# Patient Record
Sex: Female | Born: 1945 | Race: White | Hispanic: No | Marital: Single | Smoking: Former smoker
Health system: Southern US, Community
[De-identification: ages and names within clinical notes are randomized; demographics above are authoritative.]

## PROBLEM LIST (undated history)

## (undated) DIAGNOSIS — Z87440 Personal history of urinary (tract) infections: Secondary | ICD-10-CM

## (undated) DIAGNOSIS — J45909 Unspecified asthma, uncomplicated: Secondary | ICD-10-CM

## (undated) DIAGNOSIS — H409 Unspecified glaucoma: Secondary | ICD-10-CM

## (undated) DIAGNOSIS — M810 Age-related osteoporosis without current pathological fracture: Secondary | ICD-10-CM

## (undated) DIAGNOSIS — M1990 Unspecified osteoarthritis, unspecified site: Secondary | ICD-10-CM

## (undated) DIAGNOSIS — F419 Anxiety disorder, unspecified: Secondary | ICD-10-CM

## (undated) DIAGNOSIS — IMO0002 Reserved for concepts with insufficient information to code with codable children: Secondary | ICD-10-CM

## (undated) DIAGNOSIS — R51 Headache: Secondary | ICD-10-CM

## (undated) DIAGNOSIS — T7840XA Allergy, unspecified, initial encounter: Secondary | ICD-10-CM

## (undated) DIAGNOSIS — C801 Malignant (primary) neoplasm, unspecified: Secondary | ICD-10-CM

## (undated) DIAGNOSIS — E785 Hyperlipidemia, unspecified: Secondary | ICD-10-CM

## (undated) DIAGNOSIS — J301 Allergic rhinitis due to pollen: Secondary | ICD-10-CM

## (undated) DIAGNOSIS — K219 Gastro-esophageal reflux disease without esophagitis: Secondary | ICD-10-CM

## (undated) DIAGNOSIS — B019 Varicella without complication: Secondary | ICD-10-CM

## (undated) HISTORY — DX: Personal history of urinary (tract) infections: Z87.440

## (undated) HISTORY — DX: Unspecified glaucoma: H40.9

## (undated) HISTORY — DX: Allergy, unspecified, initial encounter: T78.40XA

## (undated) HISTORY — PX: MOUTH SURGERY: SHX715

## (undated) HISTORY — DX: Varicella without complication: B01.9

## (undated) HISTORY — DX: Reserved for concepts with insufficient information to code with codable children: IMO0002

## (undated) HISTORY — DX: Allergic rhinitis due to pollen: J30.1

## (undated) HISTORY — DX: Age-related osteoporosis without current pathological fracture: M81.0

## (undated) HISTORY — PX: BREAST SURGERY: SHX581

## (undated) HISTORY — DX: Unspecified osteoarthritis, unspecified site: M19.90

## (undated) HISTORY — DX: Hyperlipidemia, unspecified: E78.5

## (undated) HISTORY — DX: Gastro-esophageal reflux disease without esophagitis: K21.9

## (undated) HISTORY — DX: Malignant (primary) neoplasm, unspecified: C80.1

---
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
## 1972-06-07 HISTORY — PX: BREAST EXCISIONAL BIOPSY: SUR124

## 1994-06-07 HISTORY — PX: BREAST EXCISIONAL BIOPSY: SUR124

## 1997-06-07 HISTORY — PX: BREAST LUMPECTOMY: SHX2

## 1997-11-20 ENCOUNTER — Other Ambulatory Visit: Admission: RE | Admit: 1997-11-20 | Discharge: 1997-11-20 | Payer: Self-pay | Admitting: General Surgery

## 1997-11-27 ENCOUNTER — Ambulatory Visit (HOSPITAL_COMMUNITY): Admission: RE | Admit: 1997-11-27 | Discharge: 1997-11-28 | Payer: Self-pay | Admitting: General Surgery

## 1997-11-29 ENCOUNTER — Encounter: Admission: RE | Admit: 1997-11-29 | Discharge: 1998-02-27 | Payer: Self-pay | Admitting: Radiation Oncology

## 1998-01-09 ENCOUNTER — Ambulatory Visit (HOSPITAL_COMMUNITY): Admission: RE | Admit: 1998-01-09 | Discharge: 1998-01-09 | Payer: Self-pay | Admitting: Hematology and Oncology

## 1998-02-04 ENCOUNTER — Emergency Department (HOSPITAL_COMMUNITY): Admission: EM | Admit: 1998-02-04 | Discharge: 1998-02-04 | Payer: Self-pay | Admitting: Emergency Medicine

## 1998-04-14 ENCOUNTER — Encounter: Admission: RE | Admit: 1998-04-14 | Discharge: 1998-07-13 | Payer: Self-pay | Admitting: Radiation Oncology

## 1999-03-06 ENCOUNTER — Other Ambulatory Visit: Admission: RE | Admit: 1999-03-06 | Discharge: 1999-03-06 | Payer: Self-pay | Admitting: Internal Medicine

## 1999-07-13 ENCOUNTER — Encounter: Admission: RE | Admit: 1999-07-13 | Discharge: 1999-07-13 | Payer: Self-pay | Admitting: Hematology and Oncology

## 1999-07-13 ENCOUNTER — Encounter: Payer: Self-pay | Admitting: Hematology and Oncology

## 2000-01-26 ENCOUNTER — Encounter: Payer: Self-pay | Admitting: Hematology and Oncology

## 2000-01-26 ENCOUNTER — Encounter: Admission: RE | Admit: 2000-01-26 | Discharge: 2000-01-26 | Payer: Self-pay | Admitting: Hematology and Oncology

## 2000-07-15 ENCOUNTER — Encounter: Payer: Self-pay | Admitting: Hematology and Oncology

## 2000-07-15 ENCOUNTER — Encounter: Admission: RE | Admit: 2000-07-15 | Discharge: 2000-07-15 | Payer: Self-pay | Admitting: Hematology and Oncology

## 2001-01-30 ENCOUNTER — Encounter: Admission: RE | Admit: 2001-01-30 | Discharge: 2001-01-30 | Payer: Self-pay | Admitting: Hematology and Oncology

## 2001-01-30 ENCOUNTER — Encounter: Payer: Self-pay | Admitting: Hematology and Oncology

## 2001-12-11 ENCOUNTER — Other Ambulatory Visit: Admission: RE | Admit: 2001-12-11 | Discharge: 2001-12-11 | Payer: Self-pay | Admitting: Internal Medicine

## 2002-01-31 ENCOUNTER — Encounter: Payer: Self-pay | Admitting: Hematology and Oncology

## 2002-01-31 ENCOUNTER — Encounter: Admission: RE | Admit: 2002-01-31 | Discharge: 2002-01-31 | Payer: Self-pay | Admitting: Hematology and Oncology

## 2003-02-05 ENCOUNTER — Encounter (HOSPITAL_COMMUNITY): Payer: Self-pay | Admitting: Oncology

## 2003-02-05 ENCOUNTER — Encounter: Admission: RE | Admit: 2003-02-05 | Discharge: 2003-02-05 | Payer: Self-pay | Admitting: Oncology

## 2003-10-03 ENCOUNTER — Ambulatory Visit (HOSPITAL_COMMUNITY): Admission: RE | Admit: 2003-10-03 | Discharge: 2003-10-03 | Payer: Self-pay | Admitting: Oncology

## 2004-02-06 ENCOUNTER — Encounter: Admission: RE | Admit: 2004-02-06 | Discharge: 2004-02-06 | Payer: Self-pay | Admitting: Oncology

## 2004-07-16 ENCOUNTER — Ambulatory Visit: Payer: Self-pay | Admitting: Gastroenterology

## 2004-07-28 ENCOUNTER — Ambulatory Visit: Payer: Self-pay | Admitting: Gastroenterology

## 2004-07-28 DIAGNOSIS — K5730 Diverticulosis of large intestine without perforation or abscess without bleeding: Secondary | ICD-10-CM | POA: Insufficient documentation

## 2004-07-28 HISTORY — DX: Diverticulosis of large intestine without perforation or abscess without bleeding: K57.30

## 2004-08-21 ENCOUNTER — Ambulatory Visit: Payer: Self-pay | Admitting: Gastroenterology

## 2004-08-24 ENCOUNTER — Ambulatory Visit: Payer: Self-pay | Admitting: Gastroenterology

## 2004-08-24 DIAGNOSIS — K222 Esophageal obstruction: Secondary | ICD-10-CM

## 2004-08-24 HISTORY — DX: Esophageal obstruction: K22.2

## 2004-08-31 ENCOUNTER — Ambulatory Visit: Payer: Self-pay | Admitting: Oncology

## 2005-02-11 ENCOUNTER — Encounter: Admission: RE | Admit: 2005-02-11 | Discharge: 2005-02-11 | Payer: Self-pay | Admitting: General Surgery

## 2005-09-08 ENCOUNTER — Ambulatory Visit: Payer: Self-pay | Admitting: Oncology

## 2005-09-09 LAB — CBC WITH DIFFERENTIAL/PLATELET
Basophils Absolute: 0 10*3/uL (ref 0.0–0.1)
Eosinophils Absolute: 0.1 10*3/uL (ref 0.0–0.5)
HCT: 40.5 % (ref 34.8–46.6)
HGB: 13.8 g/dL (ref 11.6–15.9)
MCHC: 34.2 g/dL (ref 32.0–36.0)
MCV: 88.3 fL (ref 81.0–101.0)
NEUT#: 3.8 10*3/uL (ref 1.5–6.5)
Platelets: 314 10*3/uL (ref 145–400)
RBC: 4.58 10*6/uL (ref 3.70–5.32)
WBC: 5.9 10*3/uL (ref 3.9–10.0)

## 2005-09-09 LAB — COMPREHENSIVE METABOLIC PANEL
Chloride: 106 mEq/L (ref 96–112)
Creatinine, Ser: 0.7 mg/dL (ref 0.4–1.2)
Total Protein: 6.5 g/dL (ref 6.0–8.3)

## 2006-02-14 ENCOUNTER — Encounter: Admission: RE | Admit: 2006-02-14 | Discharge: 2006-02-14 | Payer: Self-pay | Admitting: General Surgery

## 2007-01-19 DIAGNOSIS — Z853 Personal history of malignant neoplasm of breast: Secondary | ICD-10-CM

## 2007-01-19 DIAGNOSIS — R51 Headache: Secondary | ICD-10-CM

## 2007-01-19 DIAGNOSIS — K219 Gastro-esophageal reflux disease without esophagitis: Secondary | ICD-10-CM

## 2007-01-19 DIAGNOSIS — R519 Headache, unspecified: Secondary | ICD-10-CM | POA: Insufficient documentation

## 2007-01-19 DIAGNOSIS — J309 Allergic rhinitis, unspecified: Secondary | ICD-10-CM | POA: Insufficient documentation

## 2007-01-19 HISTORY — DX: Allergic rhinitis, unspecified: J30.9

## 2007-01-19 HISTORY — DX: Personal history of malignant neoplasm of breast: Z85.3

## 2007-01-19 HISTORY — DX: Gastro-esophageal reflux disease without esophagitis: K21.9

## 2007-02-16 ENCOUNTER — Encounter: Admission: RE | Admit: 2007-02-16 | Discharge: 2007-02-16 | Payer: Self-pay | Admitting: Family Medicine

## 2007-03-03 ENCOUNTER — Ambulatory Visit: Payer: Self-pay | Admitting: Gastroenterology

## 2007-03-31 ENCOUNTER — Ambulatory Visit (HOSPITAL_COMMUNITY): Admission: RE | Admit: 2007-03-31 | Discharge: 2007-03-31 | Payer: Self-pay | Admitting: Gastroenterology

## 2007-03-31 ENCOUNTER — Encounter: Payer: Self-pay | Admitting: Gastroenterology

## 2007-04-10 ENCOUNTER — Ambulatory Visit: Payer: Self-pay | Admitting: Gastroenterology

## 2007-05-03 ENCOUNTER — Ambulatory Visit: Payer: Self-pay | Admitting: Gastroenterology

## 2007-06-30 DIAGNOSIS — F329 Major depressive disorder, single episode, unspecified: Secondary | ICD-10-CM

## 2007-06-30 DIAGNOSIS — F3289 Other specified depressive episodes: Secondary | ICD-10-CM | POA: Insufficient documentation

## 2007-06-30 DIAGNOSIS — M129 Arthropathy, unspecified: Secondary | ICD-10-CM

## 2007-06-30 DIAGNOSIS — I10 Essential (primary) hypertension: Secondary | ICD-10-CM

## 2007-06-30 HISTORY — DX: Essential (primary) hypertension: I10

## 2007-06-30 HISTORY — DX: Arthropathy, unspecified: M12.9

## 2008-02-19 ENCOUNTER — Encounter: Admission: RE | Admit: 2008-02-19 | Discharge: 2008-02-19 | Payer: Self-pay | Admitting: Family Medicine

## 2009-01-03 ENCOUNTER — Encounter: Admission: RE | Admit: 2009-01-03 | Discharge: 2009-01-03 | Payer: Self-pay | Admitting: Family Medicine

## 2009-02-19 ENCOUNTER — Encounter: Admission: RE | Admit: 2009-02-19 | Discharge: 2009-02-19 | Payer: Self-pay | Admitting: Family Medicine

## 2009-11-05 LAB — HM DEXA SCAN

## 2010-02-20 ENCOUNTER — Encounter: Admission: RE | Admit: 2010-02-20 | Discharge: 2010-02-20 | Payer: Self-pay | Admitting: Family Medicine

## 2010-10-20 NOTE — Assessment & Plan Note (Signed)
[facility]                         GASTROENTEROLOGY OFFICE NOTE   CASIMIRA, SUTPHIN                       MRN:          [id number]  DATE:03/03/2007                            DOB:          [birth year]    REASON FOR CONSULTATION:  Dysphagia.   Alyssa Nielsen is a pleasant 65 year old white female referred by Ms.  Warrick and Dr. Basilia Jumbo for evaluation.  She has a history of an  esophageal stricture and was last dilated in 2006.  She is complaining  of recurrent dysphagia to solids.  She also has frequent pyrosis.  She  denies odynophagia.  She has recently been prescribed Fosamax and is  concerned about taking this medicine in view of her symptoms.   PAST MEDICAL HISTORY:  1. Hypertension.  2. Arthritis.  3. Anxiety and depression.  4. Chronic headaches.  5. She has a history of breast cancer.   FAMILY HISTORY:  Pertinent for a sister with ovarian cancer.   MEDICATIONS:  Calcium, vitamin D, and Centrum.   She has no allergies.   She neither smokes nor drinks.  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   REVIEW OF SYSTEMS:  Positive for joint pains and excessive thirst,  fatigue, and back pain.   PHYSICAL EXAMINATION:  VITAL SIGNS:  Pulse 84, blood pressure 140/78,  weight 142.  HEENT: EOMI.  PERRLA.  Sclerae are anicteric.  Conjunctivae are pink.  NECK:  Supple without thyromegaly, adenopathy or carotid bruits.  CHEST:  Clear to auscultation and percussion without adventitious  sounds.  CARDIAC:  Regular rhythm; normal S1 S2.  There are no murmurs, gallops  or rubs.  ABDOMEN:  Bowel sounds are normoactive.  Abdomen is soft, nontender and  nondistended.  There are no abdominal masses, tenderness, splenic  enlargement or hepatomegaly.  EXTREMITIES:  Full range of motion.  No cyanosis, clubbing or edema.  RECTAL:  Deferred.   IMPRESSION:  1. Recurrent esophageal stricture.  2. Gastroesophageal reflux disease.   RECOMMENDATION:  1.  Begin Prevacid 30 mg a day.  2. Upper endoscopy with balloon dilatation.  3. Hold therapy with Fosamax until she is dilated to ensure that she      has no obstruction to her medicines.     Barbette Hair. Arlyce Dice, MD,FACG  Electronically Signed    RDK/MedQ  DD: 03/03/2007  DT: 03/03/2007  Job #: [id number]   cc:   Kennedy Bucker, PA

## 2010-10-20 NOTE — Assessment & Plan Note (Signed)
[facility]                         GASTROENTEROLOGY OFFICE NOTE   Alyssa, Nielsen                       MRN:          [id number]  DATE:05/03/2007                            DOB:          1946/02/07    PROBLEM:  Dysphagia.   Alyssa Nielsen has returned following esophageal dilatation.  This was  performed on March 31, 2007.  No frank stricture was seen, but she was  dilated with an 18 mm balloon because of her complaints.  Since that  time, she reports complete cessation of her symptoms.  She was without  GI complaints, including pyrosis or dysphagia.   ON EXAM:  Pulse 84, blood pressure 124/80, weight 142.   IMPRESSION:  1. Dysphagia, secondary to early esophageal stricture, resolved      following dilatation therapy.  2. GERD.   RECOMMENDATIONS:  1. Repeat dilatation as needed.  2. Continue omeprazole as needed.     Barbette Hair. Arlyce Dice, MD,FACG  Electronically Signed    RDK/MedQ  DD: 05/03/2007  DT: 05/03/2007  Job #: [id number]   cc:   Dr. Basilia Jumbo

## 2011-02-09 ENCOUNTER — Other Ambulatory Visit: Payer: Self-pay | Admitting: Family Medicine

## 2011-02-09 DIAGNOSIS — Z1231 Encounter for screening mammogram for malignant neoplasm of breast: Secondary | ICD-10-CM

## 2011-02-25 ENCOUNTER — Ambulatory Visit
Admission: RE | Admit: 2011-02-25 | Discharge: 2011-02-25 | Disposition: A | Discharge status: Home / Self Care | Payer: Medicare Other | Source: Ambulatory Visit | Attending: Family Medicine | Admitting: Family Medicine

## 2011-02-25 DIAGNOSIS — Z1231 Encounter for screening mammogram for malignant neoplasm of breast: Secondary | ICD-10-CM

## 2011-06-17 ENCOUNTER — Ambulatory Visit (INDEPENDENT_AMBULATORY_CARE_PROVIDER_SITE_OTHER): Payer: Medicare Other

## 2011-06-17 DIAGNOSIS — Q762 Congenital spondylolisthesis: Secondary | ICD-10-CM

## 2011-06-17 DIAGNOSIS — M545 Low back pain: Secondary | ICD-10-CM

## 2011-06-21 ENCOUNTER — Ambulatory Visit (INDEPENDENT_AMBULATORY_CARE_PROVIDER_SITE_OTHER): Payer: Medicare Other | Admitting: Family Medicine

## 2011-06-21 DIAGNOSIS — M545 Low back pain: Secondary | ICD-10-CM

## 2011-06-22 ENCOUNTER — Encounter: Payer: Self-pay | Admitting: Family Medicine

## 2011-06-22 DIAGNOSIS — L409 Psoriasis, unspecified: Secondary | ICD-10-CM

## 2011-06-22 DIAGNOSIS — E559 Vitamin D deficiency, unspecified: Secondary | ICD-10-CM

## 2011-06-22 DIAGNOSIS — M545 Low back pain, unspecified: Secondary | ICD-10-CM

## 2011-06-22 DIAGNOSIS — M810 Age-related osteoporosis without current pathological fracture: Secondary | ICD-10-CM | POA: Insufficient documentation

## 2011-06-22 DIAGNOSIS — K222 Esophageal obstruction: Secondary | ICD-10-CM

## 2011-06-22 HISTORY — DX: Vitamin D deficiency, unspecified: E55.9

## 2011-06-22 HISTORY — DX: Low back pain, unspecified: M54.50

## 2011-06-22 HISTORY — DX: Psoriasis, unspecified: L40.9

## 2011-06-23 ENCOUNTER — Encounter: Payer: Self-pay | Admitting: Gastroenterology

## 2011-07-12 ENCOUNTER — Other Ambulatory Visit: Payer: Self-pay | Admitting: Physician Assistant

## 2011-08-08 ENCOUNTER — Other Ambulatory Visit: Payer: Self-pay | Admitting: Physician Assistant

## 2011-08-16 ENCOUNTER — Encounter: Payer: Self-pay | Admitting: Family Medicine

## 2011-08-16 ENCOUNTER — Ambulatory Visit (INDEPENDENT_AMBULATORY_CARE_PROVIDER_SITE_OTHER): Payer: Medicare Other | Admitting: Family Medicine

## 2011-08-16 DIAGNOSIS — K5730 Diverticulosis of large intestine without perforation or abscess without bleeding: Secondary | ICD-10-CM

## 2011-08-16 DIAGNOSIS — R51 Headache: Secondary | ICD-10-CM

## 2011-08-16 DIAGNOSIS — Z853 Personal history of malignant neoplasm of breast: Secondary | ICD-10-CM

## 2011-08-16 DIAGNOSIS — K222 Esophageal obstruction: Secondary | ICD-10-CM

## 2011-08-16 DIAGNOSIS — M545 Low back pain: Secondary | ICD-10-CM

## 2011-08-16 DIAGNOSIS — K219 Gastro-esophageal reflux disease without esophagitis: Secondary | ICD-10-CM

## 2011-08-16 DIAGNOSIS — L409 Psoriasis, unspecified: Secondary | ICD-10-CM

## 2011-08-16 DIAGNOSIS — J309 Allergic rhinitis, unspecified: Secondary | ICD-10-CM

## 2011-08-16 DIAGNOSIS — R110 Nausea: Secondary | ICD-10-CM

## 2011-08-16 DIAGNOSIS — F329 Major depressive disorder, single episode, unspecified: Secondary | ICD-10-CM

## 2011-08-16 DIAGNOSIS — M129 Arthropathy, unspecified: Secondary | ICD-10-CM

## 2011-08-16 DIAGNOSIS — I10 Essential (primary) hypertension: Secondary | ICD-10-CM

## 2011-08-16 MED ORDER — GABAPENTIN 300 MG PO CAPS: 300.0000 mg | ORAL_CAPSULE | Freq: Three times a day (TID) | ORAL | Status: DC

## 2011-08-16 MED ORDER — FLUTICASONE PROPIONATE 50 MCG/ACT NA SUSP: 2.0000 | Freq: Every day | NASAL | Status: DC

## 2011-08-16 MED ORDER — ALENDRONATE SODIUM 70 MG PO TABS: ORAL_TABLET | ORAL | Status: DC

## 2011-08-16 MED ORDER — FLUTICASONE-SALMETEROL 250-50 MCG/DOSE IN AEPB: 1.0000 | INHALATION_SPRAY | Freq: Two times a day (BID) | RESPIRATORY_TRACT | Status: DC

## 2011-08-16 MED ORDER — MONTELUKAST SODIUM 10 MG PO TABS: 10.0000 mg | ORAL_TABLET | Freq: Every day | ORAL | Status: DC

## 2011-08-16 NOTE — Progress Notes (Signed)
  Subjective:    Patient ID: Alyssa Nielsen, female    DOB: November 11, 1945, 66 y.o.   MRN: [id number]  HPI  Patient presents in follow up of LBP Much improved with Neurontin and PT Pain reduced from 10/10 to 3/10 Also seeing Dr. Dierdre Forth for degenerative back disease  Asthma- needs refills of Advair; Recently has been coughing however not wheezing or DOE Patient requests referral to allergist as she has chronic post nasal drainage and eustationan                   tube symptoms.  Has not been using her Flonase.  Osteoporosis- needs refills of Fosamax; sometimes feels nauseated however not sure  if secondary to Fosamax   Review of Systems     Objective:   Physical Exam  Constitutional: She appears well-developed and well-nourished.  HENT:  Nose: Mucosal edema and rhinorrhea present.  Mouth/Throat: Posterior oropharyngeal erythema: posterior tonsillar pillars.  Neck: Neck supple. No thyromegaly present.  Cardiovascular: Normal rate, regular rhythm and normal heart sounds.   Pulmonary/Chest: Effort normal and breath sounds normal.  Lymphadenopathy:    She has no cervical adenopathy.  Neurological: She is alert.  Psychiatric: She has a normal mood and affect.       Assessment & Plan:   1. Asthma  Fluticasone-Salmeterol (ADVAIR DISKUS) 250-50 MCG/DOSE AEPB  2. ALLERGIC RHINITIS  montelukast (SINGULAIR) 10 MG tablet, fluticasone (FLONASE) 50 MCG/ACT nasal spray  3. Lower back pain    4. DEPRESSION    5. HYPERTENSION    6. Nausea    7. Osteoporosis  alendronate (FOSAMAX) 70 MG tablet  8. Psoriasis    9. Vitamin d deficiency    10. Esophageal stricture    11. ESOPHAGEAL STRICTURE    12. GERD    13. DIVERTICULOSIS OF COLON    14. ARTHRITIS    15. Headache    16. BREAST CANCER, HX OF      Nausea secondary to pnd or medication side effect. Hold the fosamax and see if symptoms resolve Flonase UAD Refilled all medications Call in 1 month if allergies still symptomatic and  will referr to allergy. Anticipatory guidance

## 2011-08-18 ENCOUNTER — Encounter: Payer: Self-pay | Admitting: Family Medicine

## 2011-10-22 ENCOUNTER — Ambulatory Visit (INDEPENDENT_AMBULATORY_CARE_PROVIDER_SITE_OTHER): Payer: Medicare Other | Admitting: Internal Medicine

## 2011-10-22 ENCOUNTER — Ambulatory Visit: Payer: Medicare Other

## 2011-10-22 VITALS — BP 150/73 | HR 78 | Temp 98.6°F | Resp 16

## 2011-10-22 DIAGNOSIS — M25569 Pain in unspecified knee: Secondary | ICD-10-CM

## 2011-10-22 NOTE — Progress Notes (Signed)
  Subjective:    Patient ID: Alyssa Nielsen, female    DOB: [birth year], 66 y.o.   MRN: [id number]  HPIPain in left knee for the past 10 days/aching/sometimes hurts with walking but often there is no problem affecting her gait She is currently doing home physical therapy 4 left hip and sacroiliac pain under the care of her rheumatologist Dr. Dierdre Forth. Today she was performing lower extremity strengthening exercises with a theraband and she felt a sudden pain in her knee in the posterior aspect. She is now unable to walk without pain Her regular pain in his extremity radiates from the lumbar area to the foot and is thought to be due to degenerative changes in both the low back and the hip. She has no history of osteoarthritis of the knee and has had not had knee problems before until recently.    Review of Systems  Constitutional: Negative for fever.  Respiratory: Negative for shortness of breath.   Cardiovascular: Negative for chest pain, palpitations and leg swelling.  Neurological: Negative for weakness and numbness.       Objective:   Physical Exam  Musculoskeletal:       Left knee: She exhibits decreased range of motion and swelling. She exhibits no effusion, no ecchymosis, no deformity, no erythema, normal alignment, no LCL laxity, normal patellar mobility, no bony tenderness and no MCL laxity. tenderness found. No medial joint line, no lateral joint line, no MCL, no LCL and no patellar tendon tenderness noted.       Legs:        [facility] reading (PRIMARY) by  Dr. Chiamaka Latka=Mild to moderate degenerative changes with no joint space abnormality   Assessment & Plan:  Problem #1 acute muscle strain in the posterior thigh/popliteal fossa Problem #2 degenerative changes in the knee not responsible for her current level of pain  Crutches Nonweightbearing with advancing weightbearing as tolerated Ice 20 minutes at every 2 hours while awake for the next 2 days Hydrocodone 7.5/500 as needed  for pain Recheck 7-10 days

## 2011-11-18 ENCOUNTER — Encounter: Payer: Self-pay | Admitting: Family Medicine

## 2011-11-18 ENCOUNTER — Ambulatory Visit: Payer: Medicare Other | Admitting: Family Medicine

## 2011-11-18 VITALS — BP 115/76 | HR 88 | Temp 99.4°F | Resp 16 | Ht 59.0 in | Wt 120.4 lb

## 2011-11-18 DIAGNOSIS — M25569 Pain in unspecified knee: Secondary | ICD-10-CM

## 2011-11-18 DIAGNOSIS — M25562 Pain in left knee: Secondary | ICD-10-CM

## 2011-11-18 DIAGNOSIS — Q749 Unspecified congenital malformation of limb(s): Secondary | ICD-10-CM

## 2011-11-18 DIAGNOSIS — G8929 Other chronic pain: Secondary | ICD-10-CM

## 2011-11-18 DIAGNOSIS — M25559 Pain in unspecified hip: Secondary | ICD-10-CM

## 2011-11-18 NOTE — Patient Instructions (Signed)
You are being referred back to [facility] for knee pain . Get OTC Calcium + Zinc + Magnesium supplement and get Chewable Adult Multivitamin.  Try applying moist heat to your knee 2-3 times daily for some relief.

## 2011-11-18 NOTE — Progress Notes (Signed)
  Subjective:    Patient ID: Alyssa Nielsen, female    DOB: 08/23/1945, 66 y.o.   MRN: [id number]  HPI  Pt is 66 y.o. Cauc female who returns for re-evaluation of knee pain. She has been wearing an ACE wrap  and continues to apply ice 2-3x daily. At 4 weeks, her knee does not feel significantly better and she can not  bear weight on left leg. She has applied Aspercreme and taken narcotic sparingly. Thinks knee now is swollen  and stiffer.         She has a chronic left hip problem and knee injury occurred while she was doing home exercises to  strengthen the hip. 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         When asked about any supplements, pt states she is not taking a MVI because of the size of most of these tablets.  She is encouraged to get a chewable Adult MVI and also may benefit from calcium-zinc-magnesium supplement.    Review of Systems  Constitutional: Positive for activity change. Negative for fever and fatigue.  Cardiovascular: Negative for leg swelling.  Musculoskeletal: Positive for joint swelling, arthralgias and gait problem.       Objective:   Physical Exam  Vitals reviewed. Constitutional: She is oriented to person, place, and time. She appears well-developed and well-nourished. No distress.  HENT:  Head: Normocephalic and atraumatic.  Cardiovascular: Normal rate.   Pulmonary/Chest: Effort normal. No respiratory distress.  Musculoskeletal:       Left knee: She exhibits decreased range of motion and swelling. She exhibits no ecchymosis, no deformity, no erythema, no LCL laxity, normal patellar mobility and no MCL laxity. tenderness found. Medial joint line tenderness noted.       No effusion noted. Tenderness in popliteal space; no joint laxity noted. Pt cannot bear weight on left leg  Neurological: She is alert and oriented to person, place, and time. No  cranial nerve deficit.  Skin: Skin is warm and dry.          Assessment & Plan:   1. Left medial knee pain  Ambulatory referral to Physical Therapy- pt was evaluated and treated at [facility] PT- I will refer her back there since she is established there and location is convenient for her Advised moist heat to joint now and continue topical analgesic  2. Chronic left hip pain  This will be re-evaluated by PT  3. Limb anomaly, congenital

## 2012-02-22 ENCOUNTER — Other Ambulatory Visit: Payer: Self-pay | Admitting: Family Medicine

## 2012-02-22 DIAGNOSIS — Z1231 Encounter for screening mammogram for malignant neoplasm of breast: Secondary | ICD-10-CM

## 2012-02-22 DIAGNOSIS — Z9889 Other specified postprocedural states: Secondary | ICD-10-CM

## 2012-03-01 ENCOUNTER — Ambulatory Visit
Admission: RE | Admit: 2012-03-01 | Discharge: 2012-03-01 | Disposition: A | Discharge status: Home / Self Care | Payer: Medicare Other | Source: Ambulatory Visit | Attending: Family Medicine | Admitting: Family Medicine

## 2012-03-01 DIAGNOSIS — Z9889 Other specified postprocedural states: Secondary | ICD-10-CM

## 2012-03-01 DIAGNOSIS — Z1231 Encounter for screening mammogram for malignant neoplasm of breast: Secondary | ICD-10-CM

## 2012-03-08 ENCOUNTER — Other Ambulatory Visit: Payer: Self-pay | Admitting: Family Medicine

## 2012-03-21 ENCOUNTER — Other Ambulatory Visit: Payer: Self-pay | Admitting: Family Medicine

## 2012-08-29 ENCOUNTER — Other Ambulatory Visit: Payer: Self-pay | Admitting: Radiology

## 2012-08-29 DIAGNOSIS — J45909 Unspecified asthma, uncomplicated: Secondary | ICD-10-CM

## 2012-08-29 DIAGNOSIS — J309 Allergic rhinitis, unspecified: Secondary | ICD-10-CM

## 2012-08-29 MED ORDER — FLUTICASONE-SALMETEROL 250-50 MCG/DOSE IN AEPB: 1.0000 | INHALATION_SPRAY | Freq: Two times a day (BID) | RESPIRATORY_TRACT | Status: DC

## 2012-08-29 MED ORDER — MONTELUKAST SODIUM 10 MG PO TABS: 10.0000 mg | ORAL_TABLET | Freq: Every day | ORAL | Status: DC

## 2012-08-29 NOTE — Telephone Encounter (Signed)
Please advise on Advair refill/ FAX from [facility]

## 2012-08-29 NOTE — Telephone Encounter (Signed)
Also fax for Singulair was rc'd please advise. Pended both

## 2012-08-29 NOTE — Telephone Encounter (Signed)
This was in Rx refill and will be dealt with it there.

## 2012-08-30 ENCOUNTER — Other Ambulatory Visit: Payer: Self-pay

## 2012-08-30 MED ORDER — FLUTICASONE-SALMETEROL 250-50 MCG/DOSE IN AEPB: 1.0000 | INHALATION_SPRAY | Freq: Two times a day (BID) | RESPIRATORY_TRACT | Status: DC

## 2012-09-14 ENCOUNTER — Other Ambulatory Visit: Payer: Self-pay | Admitting: Family Medicine

## 2012-10-06 ENCOUNTER — Ambulatory Visit: Payer: Medicare Other | Admitting: Family Medicine

## 2012-10-10 ENCOUNTER — Encounter: Payer: Self-pay | Admitting: Family Medicine

## 2012-10-10 ENCOUNTER — Ambulatory Visit (INDEPENDENT_AMBULATORY_CARE_PROVIDER_SITE_OTHER): Payer: Medicare Other | Admitting: Family Medicine

## 2012-10-10 VITALS — BP 143/76 | HR 73 | Temp 98.7°F | Resp 16 | Ht 59.5 in | Wt 116.0 lb

## 2012-10-10 DIAGNOSIS — M25562 Pain in left knee: Secondary | ICD-10-CM

## 2012-10-10 DIAGNOSIS — J45909 Unspecified asthma, uncomplicated: Secondary | ICD-10-CM

## 2012-10-10 DIAGNOSIS — R5381 Other malaise: Secondary | ICD-10-CM

## 2012-10-10 DIAGNOSIS — Z8639 Personal history of other endocrine, nutritional and metabolic disease: Secondary | ICD-10-CM

## 2012-10-10 DIAGNOSIS — R739 Hyperglycemia, unspecified: Secondary | ICD-10-CM

## 2012-10-10 DIAGNOSIS — J309 Allergic rhinitis, unspecified: Secondary | ICD-10-CM

## 2012-10-10 DIAGNOSIS — R7309 Other abnormal glucose: Secondary | ICD-10-CM

## 2012-10-10 DIAGNOSIS — M810 Age-related osteoporosis without current pathological fracture: Secondary | ICD-10-CM

## 2012-10-10 DIAGNOSIS — M25569 Pain in unspecified knee: Secondary | ICD-10-CM

## 2012-10-10 DIAGNOSIS — R5383 Other fatigue: Secondary | ICD-10-CM

## 2012-10-10 LAB — COMPREHENSIVE METABOLIC PANEL
AST: 23 U/L (ref 0–37)
Albumin: 4.3 g/dL (ref 3.5–5.2)
Alkaline Phosphatase: 70 U/L (ref 39–117)
BUN: 9 mg/dL (ref 6–23)
Calcium: 10.2 mg/dL (ref 8.4–10.5)
Chloride: 104 mEq/L (ref 96–112)
Creat: 0.6 mg/dL (ref 0.50–1.10)
Glucose, Bld: 91 mg/dL (ref 70–99)
Potassium: 4.4 mEq/L (ref 3.5–5.3)

## 2012-10-10 LAB — CBC
Platelets: 287 10*3/uL (ref 150–400)
RBC: 4.57 MIL/uL (ref 3.87–5.11)
RDW: 12.7 % (ref 11.5–15.5)
WBC: 5.3 10*3/uL (ref 4.0–10.5)

## 2012-10-10 LAB — POCT GLYCOSYLATED HEMOGLOBIN (HGB A1C): Hemoglobin A1C: 5.3

## 2012-10-10 LAB — T4, FREE: Free T4: 1.19 ng/dL (ref 0.80–1.80)

## 2012-10-10 MED ORDER — FLUTICASONE PROPIONATE 50 MCG/ACT NA SUSP: NASAL | Status: DC

## 2012-10-10 MED ORDER — MONTELUKAST SODIUM 10 MG PO TABS: 10.0000 mg | ORAL_TABLET | Freq: Every day | ORAL | Status: DC

## 2012-10-10 MED ORDER — ALENDRONATE SODIUM 70 MG PO TABS: 70.0000 mg | ORAL_TABLET | ORAL | Status: DC

## 2012-10-10 MED ORDER — FLUTICASONE-SALMETEROL 250-50 MCG/DOSE IN AEPB: 1.0000 | INHALATION_SPRAY | Freq: Two times a day (BID) | RESPIRATORY_TRACT | Status: DC

## 2012-10-10 NOTE — Patient Instructions (Signed)
Baker's Cyst A Baker's cyst is a swelling that forms in the back of the knee. It is a sac-like structure. It is filled with the same fluid that is located in your knee. The fluid located in your knee is necessary because it lubricates the bones and cartilage. It allows them to move over each other more easily. CAUSES  When the knee becomes injured or has soreness (inflammation) present, more fluid forms in the knee. When this happens, the joint lining is pushed out behind the knee and forms the baker's cyst. This cyst may also be caused by inflammation from arthritic conditions and infections. DIAGNOSIS  A Baker's cyst is most often diagnosed with an ultrasound. This is a specialized picture (like an X-ray). It shows a picture by using sound waves. Sometimes a specialized x-ray called an MRI (magnetic resonance imaging) is used. This picks up other problems within a joint if an ultrasound alone cannot make the diagnosis. If the cyst came immediately following an injury, plain x-rays may be used to make a diagnosis. TREATMENT  The treatment depends on the cause of the cyst. But most of these cysts are caused by an inflammation. Anti-inflammatory medications and rest often will get rid of the problem. If the cyst is caused by an infection, medications (antibiotics) will be prescribed to help this. Take the medications as directed. Refer to Home Care Instructions, below, for additional treatment suggestions. HOME CARE INSTRUCTIONS   If the cyst was caused by an injury, for the first 24 hours, while lying down, keep the injured extremity elevated on 2 pillows.  For the first 24 hours while you are awake, apply ice bags (ice in a plastic bag with a towel around it to prevent frostbite to skin) 3 to 4 times per day for 15 to 20 minutes to the injured area. Then do as directed by your caregiver.  Only take over-the-counter or prescription medicines for pain, discomfort, or fever as directed by your  caregiver. Persistent pain and inability to use the injured area for more than 2 to 3 days are warning signs indicating that you should see a caregiver for a follow-up visit as soon as possible. Persistent pain and swelling indicate that further evaluation, non-weight bearing (use of crutches as instructed), and/or further x-rays are needed. Make a follow-up appointment with your own caregiver. If conservative measures (rest, medications and inactivity) do not help the problem get better, sometimes surgery for removal of the cyst is needed. Reasons for this may be that the cyst is pressing on nerves and/or vessels and causing problems which cannot wait for improvement with conservative treatment. If the problem is caused by injuries to the cartilage in the knee, surgery is often needed for treatment of that problem. MAKE SURE YOU:   Understand these instructions.  Will watch your condition.  Will get help right away if you are not doing well or get worse. Document Released: 05/24/2005 Document Revised: 08/16/2011 Document Reviewed: 01/10/2008 [facility] Patient Information 2013 [place], [place].    You can take Tylenol for knee pain as well as use a topical analgesic like Capsazin-P, Arnica gel, Flexall gel or Tiger Balm. Apply this pain reliever to joint 2-3 times a day. Avoid stressing the joint with exercises or prolonged walking.

## 2012-10-10 NOTE — Progress Notes (Signed)
S:  This 67 y.o. Cauc female is here to have several issues addressed. She has chronic seasonal/ environmental allergies, controlled on current medications- nasal spray, Loratidine,  Advair and Montelukast. She has osteoporosis, diagnosed about 4 years ago; she takes Fosamax weekly w/ adverse effects. Last DEXA was 2012 per pt. Today, she presents w/ chronic fatigue, joint pain (especially L knee) and excessive thirst. She also endorses nocturia. There is family hx of thyroid disease in almost all 1st degree relatives.  Pt sleeps well and has a good appetite but has unintentional weight loss.  Pt  hs chronic L knee pain since last year. She takes Tylenol w/ minimal relief. She had been doing some exercises w/ resistance bands and felt a discomfort in L knee that will not resolve. She feels a fullness in back of knee and along medial aspect of joint. Pt does not report redness, swelling or give -way. She does report limited ROM. Pt  Has a hx of arthritis involving shoulders; she has been treated by Rheumatology in the past; this included injections.  PMHx, Soc Hx and Fam Hx reviewed.  ROS: AS per HPI. Negative for fever/ chills, appetite change, dysphagia or sore throat, CP or tightness, palpitations, cough, SOB or DOE, n/v/d, abd pain, change in stools, hematochezia or melena, rashes or erythema, HA, dizziness, weakness or syncope.  O:  Filed Vitals:   10/10/12 1518  BP: 143/76  Pulse: 73  Temp: 98.7 F (37.1 C)  Resp: 16   GEN: In NAD; WN,WD.  Weight down ~ 4.5 pounds in 13 months. HEENT: [place]/AT; EOMI w/ clear conj/ sclerae. EACs (absent cerumen)/TMs normal. Oroph clear and moist. NECK: Supple w/o LAN or TMG. COR: RRR. No m/g/r. LUNGS: CTA; normal resp rate and effort. ABD: Normal appearance. Soft w/o guarding or tenderness. No midline pulsatile mass. No masses or HSM. SKIN: W&D. No rashes or pallor. MS: L knee- No discoloration or erythema. No effusion. Good ROM w/ stiffness at beyond 90  degrees. No ligament laxity. Normal patellar mobility. Fullness in popliteal space. Tender medial aspect of joint. NEURO: A&O x 3; CNs intact. DTRs 0-1+/= in UEs and LEs. No significant gait deformity.   Xray L knee (10/22/2011): Mild to  Moderate tricompartmental degenerative changes.   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  History of vitamin D deficiency- add OTC Vitamin D3 1000 IU to current supplement.  ALLERGIC RHINITIS - Plan: montelukast (SINGULAIR) 10 MG tablet  Unspecified asthma - Plan: Fluticasone-Salmeterol (ADVAIR DISKUS) 250-50 MCG/DOSE AEPB

## 2012-10-11 ENCOUNTER — Telehealth: Payer: Self-pay | Admitting: Family Medicine

## 2012-10-11 ENCOUNTER — Encounter: Payer: Self-pay | Admitting: Family Medicine

## 2012-10-11 NOTE — Progress Notes (Signed)
Quick Note:  Please notify pt that results are normal.   Provide pt with copy of labs. ______ 

## 2012-10-11 NOTE — Telephone Encounter (Signed)
Called pt to let her know that all lab results are normal and to discuss "fatigue" with her (explore other potential causes). I will try to call her tomorrow (10/12/12).

## 2013-02-22 ENCOUNTER — Other Ambulatory Visit: Payer: Self-pay

## 2013-02-22 DIAGNOSIS — Z1231 Encounter for screening mammogram for malignant neoplasm of breast: Secondary | ICD-10-CM

## 2013-03-14 ENCOUNTER — Encounter: Payer: Self-pay | Admitting: Family Medicine

## 2013-03-14 ENCOUNTER — Ambulatory Visit: Payer: Medicare Other

## 2013-03-14 ENCOUNTER — Ambulatory Visit (INDEPENDENT_AMBULATORY_CARE_PROVIDER_SITE_OTHER): Payer: Medicare Other | Admitting: Family Medicine

## 2013-03-14 VITALS — BP 129/80 | HR 94 | Temp 98.3°F | Resp 16 | Ht 59.5 in | Wt 114.0 lb

## 2013-03-14 DIAGNOSIS — Z139 Encounter for screening, unspecified: Secondary | ICD-10-CM

## 2013-03-14 DIAGNOSIS — Z13220 Encounter for screening for lipoid disorders: Secondary | ICD-10-CM

## 2013-03-14 DIAGNOSIS — Z23 Encounter for immunization: Secondary | ICD-10-CM

## 2013-03-14 DIAGNOSIS — Z0000 Encounter for general adult medical examination without abnormal findings: Secondary | ICD-10-CM

## 2013-03-14 DIAGNOSIS — J45909 Unspecified asthma, uncomplicated: Secondary | ICD-10-CM | POA: Insufficient documentation

## 2013-03-14 DIAGNOSIS — J309 Allergic rhinitis, unspecified: Secondary | ICD-10-CM

## 2013-03-14 LAB — POCT URINALYSIS DIPSTICK
Blood, UA: NEGATIVE
Glucose, UA: NEGATIVE
Nitrite, UA: NEGATIVE
Protein, UA: NEGATIVE
Urobilinogen, UA: 0.2

## 2013-03-14 LAB — LIPID PANEL: Cholesterol: 236 mg/dL — ABNORMAL HIGH (ref 0–200)

## 2013-03-14 LAB — IFOBT (OCCULT BLOOD): IFOBT: NEGATIVE

## 2013-03-14 MED ORDER — ZOSTER VACCINE LIVE 19400 UNT/0.65ML ~~LOC~~ SOLR: 0.6500 mL | Freq: Once | SUBCUTANEOUS | Status: DC

## 2013-03-14 MED ORDER — FLUTICASONE-SALMETEROL 250-50 MCG/DOSE IN AEPB: 1.0000 | INHALATION_SPRAY | Freq: Two times a day (BID) | RESPIRATORY_TRACT | Status: DC

## 2013-03-14 MED ORDER — MONTELUKAST SODIUM 10 MG PO TABS: 10.0000 mg | ORAL_TABLET | Freq: Every day | ORAL | Status: DC

## 2013-03-14 MED ORDER — FLUTICASONE PROPIONATE 50 MCG/ACT NA SUSP: NASAL | Status: DC

## 2013-03-14 NOTE — Progress Notes (Signed)
Subjective:    Patient ID: Alyssa Nielsen, female    DOB: 1945/10/17, 67 y.o.   MRN: [id number]  HPI This pleasant 67 y.o. Cauc female is here for Annual Subsequent MCR physical. She is well and has no complaints. She has OA monitored by Dr. Sherlean Foot, she has been evaluated in the past for possible RA but this was ruled out by Dr. Dierdre Forth (rheumatologist). She has chronic bilateral shoulder discomfort and stiffness as well as L knee pain; topical analgesics are effective.  Pt has Asthma related to allergic rhinitis. She remains asymptomatic on medications/ MDIs and reports no adverse effects.  HCM: MMG- sch for 03/15/13           DEXA- 2 years ago.           CRS- 2006 (normal).           Vision- Annually   Patient Active Problem List   Diagnosis Date Noted  . UNSPECIFIED ASTHMA 03/14/2013  . OSTEOPOROSIS 06/22/2011  . Psoriasis 06/22/2011  . Lower back pain 06/22/2011  . Vitamin d deficiency 06/22/2011  . DEPRESSION 06/30/2007  . HYPERTENSION 06/30/2007  . ARTHRITIS 06/30/2007  . ALLERGIC RHINITIS 01/19/2007  . GERD 01/19/2007  . BREAST CANCER, HX OF 01/19/2007  . ESOPHAGEAL STRICTURE 08/24/2004  . DIVERTICULOSIS OF COLON 07/28/2004   PMHx, Soc Hx and Fam Hx reviewed.   Review of Systems  Respiratory: Negative for cough, chest tightness, shortness of breath and wheezing.   Cardiovascular: Negative for chest pain and palpitations.  Musculoskeletal: Positive for arthralgias.  Psychiatric/Behavioral:       HANDS questionnaire for Depression Screening score= 5  All other systems reviewed and are negative.       Objective:   Physical Exam  Nursing note and vitals reviewed. Constitutional: She is oriented to person, place, and time. Vital signs are normal. She appears well-developed and well-nourished. No distress.  HENT:  Head: Normocephalic and atraumatic.  Right Ear: Hearing, external ear and ear canal normal. Tympanic membrane is scarred.  Left Ear: Hearing, external ear  and ear canal normal. Tympanic membrane is scarred. Tympanic membrane is not injected.  Nose: Nose normal. No mucosal edema, nasal deformity or septal deviation. Right sinus exhibits no maxillary sinus tenderness and no frontal sinus tenderness. Left sinus exhibits no maxillary sinus tenderness and no frontal sinus tenderness.  Mouth/Throat: Uvula is midline, oropharynx is clear and moist and mucous membranes are normal. No oral lesions. Normal dentition. No dental caries.  Eyes: Conjunctivae, EOM and lids are normal. No scleral icterus.  Wears corrective lenses.  Neck: Normal range of motion and full passive range of motion without pain. Neck supple. No JVD present. No spinous process tenderness and no muscular tenderness present. Carotid bruit is not present. No mass and no thyromegaly present.  Cardiovascular: Normal rate, regular rhythm, S1 normal, S2 normal, normal heart sounds and normal pulses.   No extrasystoles are present. PMI is not displaced.  Exam reveals no gallop and no friction rub.   No murmur heard. Pulses:      Carotid pulses are 2+ on the right side, and 2+ on the left side.      Radial pulses are 2+ on the right side, and 2+ on the left side.       Femoral pulses are 2+ on the right side, and 2+ on the left side.      Popliteal pulses are 2+ on the right side, and 2+ on the left side.  Dorsalis pedis pulses are 2+ on the right side, and 2+ on the left side.       Posterior tibial pulses are 2+ on the right side, and 2+ on the left side.  Pulmonary/Chest: Effort normal and breath sounds normal. No respiratory distress.  Abdominal: Soft. Normal appearance and bowel sounds are normal. She exhibits no distension, no pulsatile midline mass and no mass. There is no hepatosplenomegaly. There is no tenderness. There is no guarding and no CVA tenderness. No hernia.  Genitourinary:  NEFG; pelvic exam deferred.  Musculoskeletal:       Right shoulder: She exhibits decreased range of  motion, tenderness, pain and decreased strength. She exhibits no swelling and no deformity.       Left shoulder: She exhibits decreased range of motion, tenderness, pain and decreased strength. She exhibits no swelling and no deformity.       Right wrist: Normal.       Left wrist: Normal.       Right knee: She exhibits deformity. She exhibits normal range of motion, no swelling, no effusion, no erythema and normal alignment. No tenderness found.       Left knee: She exhibits decreased range of motion, swelling, deformity, abnormal alignment and bony tenderness. Tenderness found.       Right hand: She exhibits decreased range of motion, tenderness, bony tenderness, deformity and swelling. Normal sensation noted. Normal strength noted.       Left hand: She exhibits decreased range of motion, tenderness, bony tenderness, deformity and swelling. Normal sensation noted. Normal strength noted.  Lymphadenopathy:       Head (right side): No submental, no submandibular, no tonsillar, no posterior auricular and no occipital adenopathy present.       Head (left side): No submental, no submandibular, no tonsillar, no posterior auricular and no occipital adenopathy present.    She has no cervical adenopathy.       Right: No inguinal and no supraclavicular adenopathy present.       Left: No inguinal and no supraclavicular adenopathy present.  Neurological: She is alert and oriented to person, place, and time. She has normal strength. She displays no atrophy and no tremor. No cranial nerve deficit or sensory deficit. She exhibits normal muscle tone. She displays a negative Romberg sign. Coordination and gait normal.  Reflex Scores:      Tricep reflexes are 1+ on the right side and 1+ on the left side.      Bicep reflexes are 1+ on the right side and 1+ on the left side.      Patellar reflexes are 2+ on the right side and 2+ on the left side. Skin: Skin is warm, dry and intact. Lesion noted. No ecchymosis, no  petechiae and no rash noted. She is not diaphoretic. There is erythema. No cyanosis. No pallor. Nails show no clubbing.  Psychiatric: She has a normal mood and affect. Her speech is normal and behavior is normal. Judgment and thought content normal. Her mood appears not anxious. Her affect is not inappropriate. Cognition and memory are normal. She does not exhibit a depressed mood.   Results for orders placed in visit on 03/14/13  POCT URINALYSIS DIPSTICK      Result Value Range   Color, UA yellow     Clarity, UA clear     Glucose, UA neg     Bilirubin, UA neg     Ketones, UA neg     Spec Grav, UA 1.015  Blood, UA neg     pH, UA 7.0     Protein, UA neg     Urobilinogen, UA 0.2     Nitrite, UA neg     Leukocytes, UA Negative    IFOBT (OCCULT BLOOD)      Result Value Range   IFOBT Negative      ECG: NSR; no ST-TW changes. No ectopy.      Assessment & Plan:  Routine general medical examination at a health care facility - Plan: POCT urinalysis dipstick, IFOBT POC (occult bld, rslt in office), EKG 12-Lead  Unspecified asthma(493.90) - Stable and controlled on current medications; continue same. Plan: Fluticasone-Salmeterol (ADVAIR DISKUS) 250-50 MCG/DOSE AEPB  ALLERGIC RHINITIS - Plan: montelukast (SINGULAIR) 10 MG tablet  Screening for lipoid disorders - Plan: Lipid panel  Need for prophylactic vaccination and inoculation against influenza - Plan: Flu Vaccine QUAD 36+ mos IM  Need for zoster vaccination - Plan: zoster vaccine live, PF, (ZOSTAVAX) 78469 UNT/0.65ML injection  Meds ordered this encounter  Medications  . Multiple Vitamin (MULTIVITAMIN) tablet    Sig: Take 1 tablet by mouth daily.  . Calcium Carbonate-Vitamin D (CALCIUM-VITAMIN D) 500-200 MG-UNIT per tablet    Sig: Take 1 tablet by mouth 2 (two) times daily with a meal.  . Fluticasone-Salmeterol (ADVAIR DISKUS) 250-50 MCG/DOSE AEPB    Sig: Inhale 1 puff into the lungs 2 (two) times daily.    Dispense:  60  each    Refill:  11  . montelukast (SINGULAIR) 10 MG tablet    Sig: Take 1 tablet (10 mg total) by mouth at bedtime.    Dispense:  30 tablet    Refill:  11  . fluticasone (FLONASE) 50 MCG/ACT nasal spray    Sig: USE 2 SPRAYS INTO THE NOSE DAILY    Dispense:  16 g    Refill:  2  . zoster vaccine live, PF, (ZOSTAVAX) 62952 UNT/0.65ML injection    Sig: Inject 19,400 Units into the skin once.    Dispense:  1 each    Refill:  0

## 2013-03-14 NOTE — Patient Instructions (Signed)
Keeping You Healthy  Get These Tests  Blood Pressure- Have your blood pressure checked by your healthcare provider at least once a year.  Normal blood pressure is 120/80.  Weight- Have your body mass index (BMI) calculated to screen for obesity.  BMI is a measure of body fat based on height and weight.  You can calculate your own BMI at https://www.west-esparza.com/  Cholesterol- Have your cholesterol checked every year.  Diabetes- Have your blood sugar checked every year if you have high blood pressure, high cholesterol, a family history of diabetes or if you are overweight.  Pap Smear- Have a pap smear every 1 to 3 years if you have been sexually active.  If you are older than 65 and recent pap smears have been normal you may not need additional pap smears.  In addition, if you have had a hysterectomy  For benign disease additional pap smears are not necessary.  Mammogram-Yearly mammograms are essential for early detection of breast cancer  Screening for Colon Cancer- Colonoscopy starting at age 90. Screening may begin sooner depending on your family history and other health conditions.  Follow up colonoscopy as directed by your Gastroenterologist.  Screening for Osteoporosis- Screening begins at age 66 with bone density scanning, sooner if you are at higher risk for developing Osteoporosis.  Get these medicines  Calcium with Vitamin D- Your body requires 1200-1500 mg of Calcium a day and [phone] IU of Vitamin D a day.  You can only absorb 500 mg of Calcium at a time therefore Calcium must be taken in 2 or 3 separate doses throughout the day.  Hormones- Hormone therapy has been associated with increased risk for certain cancers and heart disease.  Talk to your healthcare provider about if you need relief from menopausal symptoms.  Aspirin- Ask your healthcare provider about taking Aspirin to prevent Heart Disease and Stroke.  Get these Immuniztions  Flu shot- Every fall You received the  vaccine today.  Pneumonia shot- Once after the age of 10; if you are younger ask your healthcare provider if you need a pneumonia shot. You received this vaccine in 2011.  Tetanus- Every ten years. This vaccine is due in 2018.  Zostavax- Once after the age of 52 to prevent shingles. You have a prescription for this vaccine.  Take these steps  Don't smoke- Your healthcare provider can help you quit. For tips on how to quit, ask your healthcare provider or go to www.smokefree.gov or call 1-800 QUIT-NOW.  Be physically active- Exercise 5 days a week for a minimum of 30 minutes.  If you are not already physically active, start slow and gradually work up to 30 minutes of moderate physical activity.  Try walking, dancing, bike riding, swimming, etc.  Eat a healthy diet- Eat a variety of healthy foods such as fruits, vegetables, whole grains, low fat milk, low fat cheeses, yogurt, lean meats, chicken, fish, eggs, dried beans, tofu, etc.  For more information go to www.thenutritionsource.org  Dental visit- Brush and floss teeth twice daily; visit your dentist twice a year.  Eye exam- Visit your Optometrist or Ophthalmologist yearly.  Drink alcohol in moderation- Limit alcohol intake to one drink or less a day.  Never drink and drive.  Depression- Your emotional health is as important as your physical health.  If you're feeling down or losing interest in things you normally enjoy, please talk to your healthcare provider.  Seat Belts- can save your life; always wear one  Smoke/Carbon Monoxide detectors- These  detectors need to be installed on the appropriate level of your home.  Replace batteries at least once a year.  Violence- If anyone is threatening or hurting you, please tell your healthcare provider.  Living Will/ Health care power of attorney- Discuss with your healthcare provider and family.

## 2013-03-15 ENCOUNTER — Other Ambulatory Visit: Payer: Self-pay | Admitting: Radiology

## 2013-03-15 ENCOUNTER — Ambulatory Visit
Admission: RE | Admit: 2013-03-15 | Discharge: 2013-03-15 | Disposition: A | Discharge status: Home / Self Care | Payer: Medicare Other | Source: Ambulatory Visit

## 2013-03-15 DIAGNOSIS — Z1231 Encounter for screening mammogram for malignant neoplasm of breast: Secondary | ICD-10-CM

## 2013-03-15 NOTE — Progress Notes (Signed)
Quick Note:  Please advise pt regarding following labs...  Your Total and LDL ("bad") cholesterol numbers are above normal. But your HDL ("good") cholesterol is so high that your risk is heart disease is very low! Continue current medications and nutrition choices and stay active.  Copy to pt. ______

## 2013-03-19 ENCOUNTER — Encounter: Payer: Self-pay | Admitting: Family Medicine

## 2013-04-19 ENCOUNTER — Ambulatory Visit: Payer: Medicare Other

## 2013-04-19 ENCOUNTER — Ambulatory Visit (INDEPENDENT_AMBULATORY_CARE_PROVIDER_SITE_OTHER): Payer: Medicare Other | Admitting: Emergency Medicine

## 2013-04-19 VITALS — BP 142/78 | HR 102 | Temp 98.9°F | Resp 16 | Ht 60.0 in | Wt 116.0 lb

## 2013-04-19 DIAGNOSIS — T189XXA Foreign body of alimentary tract, part unspecified, initial encounter: Secondary | ICD-10-CM

## 2013-04-19 NOTE — Patient Instructions (Signed)
Swallowed Foreign Body, Adult °You have swallowed an object (foreign body). Once the foreign body has passed through the food tube (esophagus), which leads from the mouth to the stomach, it will usually continue through the body without problems. This is because the point where the esophagus enters into the stomach is the narrowest place through which the foreign body must pass. °Sometimes the foreign body gets stuck. The most common type of foreign body obstruction in adults is food impaction. Many times, bones from fish or meat products may become lodged in the esophagus or injure the throat on the way down. When there is an object that obstructs the esophagus, the most obvious symptoms are pain and the inability to swallow normally. In some cases, foreign bodies that can be life threatening are swallowed. Examples of these are certain medications and illicit drugs. Often in these instances, patients are afraid of telling what they swallowed. However, it is extremely important to tell the emergency caregiver what was swallowed because life-saving treatment may be needed.  °X-ray exams may be taken to find the location of the foreign body. However, some objects do not show up well or may be too small to be seen on an X-ray image. If the foreign body is too large or too sharp, it may be too dangerous to allow it to pass on its own. You may need to see a caregiver who specializes in the digestive system (gastroenterologist). In a few cases, a specialist may need to remove the object using a method called "endoscopy".  This involves passing a thin, soft, flexible tube into the food pipe to locate and remove the object. Follow up with your primary doctor or the referral you were given by the emergency caregiver. °HOME CARE INSTRUCTIONS  °· If your caregiver says it is safe for you to eat, then only have liquids and soft foods until your symptoms improve. °· Once you are eating normally: °· Cut food into small  pieces. °· Remove small bones from food. °· Remove large seeds and pits from fruit. °· Chew your food well. °· Do not talk, laugh, or engage in physical activity while eating or swallowing. °SEEK MEDICAL CARE IF: °· You develop worsening shortness of breath, uncontrollable coughing, chest pains or high fever, greater than 102° F (38.9° C). °· You are unable to eat or drink or you feel that food is getting stuck in your throat. °· You have choking symptoms or cannot stop drooling. °· You develop abdominal pain, vomiting (especially of blood), or rectal bleeding. °MAKE SURE YOU:  °· Understand these instructions. °· Will watch your condition. °· Will get help right away if you are not doing well or get worse. °Document Released: 11/11/2009 Document Revised: 08/16/2011 Document Reviewed: 11/11/2009 °[employer]® Patient Information ©2014 [employer]. ° °

## 2013-04-19 NOTE — Progress Notes (Signed)
[facility] [address] [place] [address] [phone]  Date:  04/19/2013   Name:  Alyssa Nielsen   DOB:  1946/01/29   MRN:  [id number]  PCP:  Dow Adolph, MD    Chief Complaint: Swallowed Foreign Body   History of Present Illness:  Alyssa Nielsen is a 67 y.o. very pleasant female patient who presents with the following:  Ate a piece of cornbread tonight around 1800 and has a persistent FB sensation in left back of throat.  Says she gagged up some blood following.  Has no dysphagia or odynophagia.  Swallowing secretions and liquids without difficulty.  No nausea or vomiting.  No nasal congestion or cough, wheezing or shortness of breath.  No improvement with over the counter medications or other home remedies. Denies other complaint or health concern today.   Patient Active Problem List   Diagnosis Date Noted  . UNSPECIFIED ASTHMA 03/14/2013  . OSTEOPOROSIS 06/22/2011  . Psoriasis 06/22/2011  . Lower back pain 06/22/2011  . Vitamin d deficiency 06/22/2011  . DEPRESSION 06/30/2007  . HYPERTENSION 06/30/2007  . ARTHRITIS 06/30/2007  . ALLERGIC RHINITIS 01/19/2007  . GERD 01/19/2007  . BREAST CANCER, HX OF 01/19/2007  . ESOPHAGEAL STRICTURE 08/24/2004  . DIVERTICULOSIS OF COLON 07/28/2004    Past Medical History  Diagnosis Date  . Allergy   . Arthritis 2011 and 2013    spine and L knee  . GERD (gastroesophageal reflux disease)   . Ulcer     STOMACH  . Osteoporosis   . Cancer     breast cancer    Past Surgical History  Procedure Laterality Date  . Breast surgery  1999  1996    R BREAST (CANCEROUS)  L BREAST (BENIGN)     . Tonsillectomy      AGE 89  . Mouth surgery      WISDOM TEETH REMOVAL    History  Substance Use Topics  . Smoking status: Former Smoker -- 2 years    Types: Cigarettes    Quit date: 06/07/1973  . Smokeless tobacco: Not on file  . Alcohol Use: No    Family History  Problem Relation Age of Onset  .  Hypertension Mother   . Heart disease Mother   . Stroke Mother     HEMORRAGHIC  . Cancer Father 89    PANCREATIC  . Hypertension Father   . Hypertension Sister   . Thyroid disease Sister   . Stroke Sister   . Hypertension Brother     Allergies  Allergen Reactions  . Aspirin Other (See Comments)    GI UPSET-STOMACH BURNS  . Ibuprofen Other (See Comments)    STOMACH BURNS SLIGHTLY    Medication list has been reviewed and updated.  Current Outpatient Prescriptions on File Prior to Visit  Medication Sig Dispense Refill  . alendronate (FOSAMAX) 70 MG tablet Take 1 tablet (70 mg total) by mouth every 7 (seven) days. Take with a full glass of water on an empty stomach.  4 tablet  11  . Calcium Carbonate-Vitamin D (CALCIUM-VITAMIN D) 500-200 MG-UNIT per tablet Take 1 tablet by mouth 2 (two) times daily with a meal.      . fluticasone (FLONASE) 50 MCG/ACT nasal spray USE 2 SPRAYS INTO THE NOSE DAILY  16 g  2  . Fluticasone-Salmeterol (ADVAIR DISKUS) 250-50 MCG/DOSE AEPB Inhale 1 puff into the lungs 2 (two) times daily.  60 each  11  . loratadine (CLARITIN)  10 MG tablet Take 10 mg by mouth daily.      . montelukast (SINGULAIR) 10 MG tablet Take 1 tablet (10 mg total) by mouth at bedtime.  30 tablet  11  . Multiple Vitamin (MULTIVITAMIN) tablet Take 1 tablet by mouth daily.       No current facility-administered medications on file prior to visit.    Review of Systems:  As per HPI, otherwise negative.    Physical Examination: Filed Vitals:   04/19/13 1955  BP: 142/78  Pulse: 102  Temp: 98.9 F (37.2 C)  Resp: 16   Filed Vitals:   04/19/13 1955  Height: 5' (1.524 m)  Weight: 116 lb (52.617 kg)   Body mass index is 22.65 kg/(m^2). Ideal Body Weight: Weight in (lb) to have BMI = 25: 127.7   GEN: WDWN, NAD, Non-toxic, Alert & Oriented x 3 HEENT: Atraumatic, Normocephalic.  Oropharynx negative.  No FB or bleeding site Ears and Nose: No external deformity. EXTR: No  clubbing/cyanosis/edema NEURO: Normal gait.  PSYCH: Normally interactive. Conversant. Not depressed or anxious appearing.  Calm demeanor.    Assessment and Plan: Possible esophageal foreign body maalox ENT follow up   Signed,  Phillips Odor, MD   [facility] reading (PRIMARY) by  Dr. Dareen Piano.  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

## 2013-08-31 ENCOUNTER — Other Ambulatory Visit: Payer: Self-pay | Admitting: Family Medicine

## 2013-09-19 ENCOUNTER — Ambulatory Visit (INDEPENDENT_AMBULATORY_CARE_PROVIDER_SITE_OTHER): Payer: Medicare Other | Admitting: Family Medicine

## 2013-09-19 ENCOUNTER — Encounter: Payer: Self-pay | Admitting: Family Medicine

## 2013-09-19 ENCOUNTER — Ambulatory Visit: Payer: Medicare Other

## 2013-09-19 VITALS — BP 138/78 | HR 72 | Temp 98.2°F | Resp 16 | Ht 59.0 in | Wt 116.2 lb

## 2013-09-19 DIAGNOSIS — M545 Low back pain, unspecified: Secondary | ICD-10-CM

## 2013-09-19 DIAGNOSIS — R10A1 Flank pain, right side: Secondary | ICD-10-CM

## 2013-09-19 DIAGNOSIS — R109 Unspecified abdominal pain: Secondary | ICD-10-CM

## 2013-09-19 DIAGNOSIS — J309 Allergic rhinitis, unspecified: Secondary | ICD-10-CM

## 2013-09-19 LAB — POCT URINALYSIS DIPSTICK
Bilirubin, UA: NEGATIVE
Glucose, UA: NEGATIVE
Ketones, UA: NEGATIVE
Leukocytes, UA: NEGATIVE
Nitrite, UA: NEGATIVE
Protein, UA: NEGATIVE
Spec Grav, UA: 1.01
Urobilinogen, UA: 0.2
pH, UA: 7

## 2013-09-19 LAB — POCT UA - MICROSCOPIC ONLY
Casts, Ur, LPF, POC: NEGATIVE
Crystals, Ur, HPF, POC: NEGATIVE
Epithelial cells, urine per micros: NEGATIVE
Mucus, UA: NEGATIVE
WBC, Ur, HPF, POC: NEGATIVE
Yeast, UA: NEGATIVE

## 2013-09-19 MED ORDER — FLUTICASONE PROPIONATE 50 MCG/ACT NA SUSP: NASAL | Status: DC

## 2013-09-19 NOTE — Patient Instructions (Signed)
Degenerative Disk Disease Degenerative disk disease is a condition caused by the changes that occur in the cushions of the backbone (spinal disks) as you grow older. Spinal disks are soft and compressible disks located between the bones of the spine (vertebrae). They act like shock absorbers. Degenerative disk disease can affect the whole spine. However, the neck and lower back are most commonly affected. Many changes can occur in the spinal disks with aging, such as:  The spinal disks may dry and shrink.  Small tears may occur in the tough, outer covering of the disk (annulus).  The disk space may become smaller due to loss of water.  Abnormal growths in the bone (spurs) may occur. This can put pressure on the nerve roots exiting the spinal canal, causing pain.  The spinal canal may become narrowed. CAUSES  Degenerative disk disease is a condition caused by the changes that occur in the spinal disks with aging. The exact cause is not known, but there is a genetic basis for many patients. Degenerative changes can occur due to loss of fluid in the disk. This makes the disk thinner and reduces the space between the backbones. Small cracks can develop in the outer layer of the disk. This can lead to the breakdown of the disk. You are more likely to get degenerative disk disease if you are overweight. Smoking cigarettes and doing heavy work such as weightlifting can also increase your risk of this condition. Degenerative changes can start after a sudden injury. Growth of bone spurs can compress the nerve roots and cause pain.  SYMPTOMS  The symptoms vary from person to person. Some people may have no pain, while others have severe pain. The pain may be so severe that it can limit your activities. The location of the pain depends on the part of your backbone that is affected. You will have neck or arm pain if a disk in the neck area is affected. You will have pain in your back, buttocks, or legs if a disk  in the lower back is affected. The pain becomes worse while bending, reaching up, or with twisting movements. The pain may start gradually and then get worse as time passes. It may also start after a major or minor injury. You may feel numbness or tingling in the arms or legs.  DIAGNOSIS  Your caregiver will ask you about your symptoms and about activities or habits that may cause the pain. He or she may also ask about any injuries, diseases, or treatments you have had earlier. Your caregiver will examine you to check for the range of movement that is possible in the affected area, to check for strength in your extremities, and to check for sensation in the areas of the arms and legs supplied by different nerve roots. An X-ray of the spine may be taken. Your caregiver may suggest other imaging tests, such as magnetic resonance imaging (MRI), if needed.  TREATMENT  Treatment includes rest, modifying your activities, and applying ice and heat. Your caregiver may prescribe medicines to reduce your pain and may ask you to do some exercises to strengthen your back. In some cases, you may need surgery. You and your caregiver will decide on the treatment that is best for you. HOME CARE INSTRUCTIONS   Follow proper lifting and walking techniques as advised by your caregiver.  Maintain good posture.  Exercise regularly as advised.  Perform relaxation exercises.  Change your sitting, standing, and sleeping habits as advised. Change positions   frequently.  Lose weight as advised.  Stop smoking if you smoke.  Wear supportive footwear. SEEK MEDICAL CARE IF:  Your pain does not go away within 1 to 4 weeks. SEEK IMMEDIATE MEDICAL CARE IF:   Your pain is severe.  You notice weakness in your arms, hands, or legs.  You begin to lose control of your bladder or bowel movements. MAKE SURE YOU:   Understand these instructions.  Will watch your condition.  Will get help right away if you are not doing  well or get worse. Document Released: 03/21/2007 Document Revised: 08/16/2011 Document Reviewed: 03/21/2007 [facility] Patient Information 2014 [place].    Take Tylenol daily for pain. If you need to take Aleve, take 1 tablet with a meal.   MAke an appointment to see Dr. Ronnie Derby about your knee pain.

## 2013-09-19 NOTE — Progress Notes (Signed)
Subjective:    Patient ID: Alyssa Nielsen, female    DOB: 02-22-1946, 68 y.o.   MRN: [id number]  HPI  This 68 y.o Cauc female presents today with 3-4 week hx of R flank pain. She has a hx of low back pain, evaluated by rheumatology 4 years ago; xrays showed some "mild arthritis" in spine and hips. Physical therapy instructed pt in home exercises which she continues to do daily. She also has leg length discrepancy but does not wear her shoe lift daily. She tries to stay active w/ daily walks w/ her dog. She fell backwards a few weeks ago, landing on her bottom. Generic Tylenol effective for pain- 2 tablets once a day.  Right flank pain not associated w/ fever/chills, n/v/d, cough, SOB, cough, dysuria or hematuria.  Pt has chronic knee pain, treated by Dr. Ronnie Derby w/ injection which helped temporarily. A partial knee replacement has been advised.   Pt has year-round allergies and c/o fullness in back of throat w/ difficulty swallowing. Nasal congestion is severe enough that pt is a mouth- breather. She is not using steroid nasal spray as prescribed.  Review of Systems     Objective:   Physical Exam  Nursing note and vitals reviewed. Constitutional: She is oriented to person, place, and time. Vital signs are normal. She appears well-developed and well-nourished. No distress.  HENT:  Head: Normocephalic and atraumatic.  Right Ear: Hearing, tympanic membrane, external ear and ear canal normal.  Left Ear: Hearing, tympanic membrane, external ear and ear canal normal.  Nose: Mucosal edema present. No rhinorrhea, nasal deformity or septal deviation.  Mouth/Throat: Uvula is midline and mucous membranes are normal. No oral lesions. Normal dentition. No dental caries. Posterior oropharyngeal erythema present. No oropharyngeal exudate.    Eyes: Conjunctivae, EOM and lids are normal. Pupils are equal, round, and reactive to light. No scleral icterus.  Neck: Neck supple. Muscular tenderness present. No  spinous process tenderness present. No mass and no thyromegaly present.  Cardiovascular: Normal rate, regular rhythm and normal heart sounds.  Exam reveals no gallop and no friction rub.   No murmur heard. Pulmonary/Chest: Effort normal and breath sounds normal. No respiratory distress.  Musculoskeletal: She exhibits no edema.       Cervical back: She exhibits decreased range of motion, tenderness and spasm. She exhibits no bony tenderness and no deformity.       Thoracic back: She exhibits decreased range of motion and spasm. She exhibits no bony tenderness and no swelling.       Lumbar back: She exhibits decreased range of motion, tenderness and spasm. She exhibits no bony tenderness, no deformity and no pain.  Knees- degenerative changes bilaterally.  Neurological: She is alert and oriented to person, place, and time. No cranial nerve deficit. She exhibits normal muscle tone. Coordination normal.  Skin: Skin is warm. No rash noted. She is diaphoretic. No erythema. No pallor.  Psychiatric: Her speech is normal and behavior is normal. Thought content normal. Her mood appears anxious. Her affect is not inappropriate. Cognition and memory are normal.  Flat affect.    Results for orders placed in visit on 09/19/13             POCT UA - MICROSCOPIC ONLY      Result Value Ref Range   WBC, Ur, HPF, POC neg     RBC, urine, microscopic 3-5     Bacteria, U Microscopic trace     Mucus, UA neg  Epithelial cells, urine per micros neg     Crystals, Ur, HPF, POC neg     Casts, Ur, LPF, POC neg     Yeast, UA neg    POCT URINALYSIS DIPSTICK      Result Value Ref Range   Color, UA yellow     Clarity, UA clear     Glucose, UA neg     Bilirubin, UA neg     Ketones, UA neg     Spec Grav, UA 1.010     Blood, UA trace     pH, UA 7.0     Protein, UA neg     Urobilinogen, UA 0.2     Nitrite, UA neg     Leukocytes, UA Negative     [facility] reading (PRIMARY) by  Dr. Leward Quan: LS Spine xrays-  Moderate degree of degenerative disc disease; scoliosis of lower vertebrae. Undigested caplets seen in digestive tract. No fractures. Bones are osteopenic.     Assessment & Plan:  Right flank pain - Continue exercises and follow-up w/ Dr. Ronnie Derby re: knee pain and possible injection. Plan: DG Lumbar Spine Complete, POCT UA - Microscopic Only, POCT urinalysis dipstick  Low back pain radiating to right leg: Lumbar DDD-If pain persists or worsens, consider referral to specialist for further evaluation.   Plan: DG Lumbar Spine Complete  ALLERGIC RHINITIS- Continue Montelukast and use Fluticasone daily.

## 2013-09-20 ENCOUNTER — Encounter: Payer: Self-pay | Admitting: Family Medicine

## 2013-10-11 ENCOUNTER — Other Ambulatory Visit: Payer: Self-pay | Admitting: Family Medicine

## 2013-11-08 ENCOUNTER — Encounter: Payer: Self-pay | Admitting: Family Medicine

## 2013-11-08 ENCOUNTER — Ambulatory Visit (INDEPENDENT_AMBULATORY_CARE_PROVIDER_SITE_OTHER): Payer: Medicare Other | Admitting: Family Medicine

## 2013-11-08 VITALS — BP 126/70 | HR 73 | Temp 98.5°F | Resp 16 | Ht 59.0 in | Wt 116.2 lb

## 2013-11-08 DIAGNOSIS — R229 Localized swelling, mass and lump, unspecified: Secondary | ICD-10-CM

## 2013-11-08 NOTE — Patient Instructions (Signed)
You are considering knee surgery with Dr. Lara Mulch. When you have a surgery date, contact our clinic to schedule a pre-surgical clearance. Schedule this visit 2-4 weeks before surgery.

## 2013-11-08 NOTE — Progress Notes (Signed)
S:  This 68 y.o. Cauc female has moderately severe DJD of L knee; recent evaluation w/ Dr. Ronnie Derby resulted in injection and discussion about knee surgery. Pt is considering partial knee replacement which was reviewed by Dr. Ronnie Derby. He advised her to schedule procedure, have pre-surgical clearance with me then he will order an MRI.   Pt had recent dental visit; a subcutaneous nodule was seen and dentist advised follow-up w/ PCP. Pt states cyst-like mass has been present for years but has grown a little. It is painless and has no redness or drainage. No associated lymph swelling. Dental exam was remarkable for placement of a new crown placed.  Patient Active Problem List   Diagnosis Date Noted  . UNSPECIFIED ASTHMA 03/14/2013  . OSTEOPOROSIS 06/22/2011  . Psoriasis 06/22/2011  . Lower back pain 06/22/2011  . Vitamin d deficiency 06/22/2011  . DEPRESSION 06/30/2007  . HYPERTENSION 06/30/2007  . ARTHRITIS 06/30/2007  . ALLERGIC RHINITIS 01/19/2007  . GERD 01/19/2007  . BREAST CANCER, HX OF 01/19/2007  . ESOPHAGEAL STRICTURE 08/24/2004  . DIVERTICULOSIS OF COLON 07/28/2004   Prior to Admission medications   Medication Sig Start Date End Date Taking? Authorizing Provider  alendronate (FOSAMAX) 70 MG tablet TAKE 1 TABLET EVERY 7 DAYS WITH A FULL GLASS OF WATER ON AN EMPTY STOMACH 10/11/13  Yes Barton Fanny, MD  Calcium Citrate-Vitamin D (CITRACAL + D PO) Take by mouth.   Yes Historical Provider, MD  fluticasone (FLONASE) 50 MCG/ACT nasal spray USE 1-2 SPRAYS INTO THE NOSE DAILY 09/19/13  Yes Barton Fanny, MD  Fluticasone-Salmeterol (ADVAIR DISKUS) 250-50 MCG/DOSE AEPB Inhale 1 puff into the lungs 2 (two) times daily. 03/14/13  Yes Barton Fanny, MD  loratadine (CLARITIN) 10 MG tablet Take 10 mg by mouth daily.   Yes Historical Provider, MD  montelukast (SINGULAIR) 10 MG tablet Take 1 tablet (10 mg total) by mouth at bedtime. 03/14/13  Yes Barton Fanny, MD  Multiple Vitamin  (MULTIVITAMIN) tablet Take 1 tablet by mouth daily.   Yes Historical Provider, MD  OVER THE COUNTER MEDICATION OTC Systane eye drops taking   Yes Historical Provider, MD  OVER THE COUNTER MEDICATION Alaway eye drops taking   Yes Historical Provider, MD   PMHx, Surg Hx, Soc and Fam Hx reviewed.  ROS: As per HPI.  O: Filed Vitals:   11/08/13 1311  BP: 126/70  Pulse: 73  Temp: 98.5 F (36.9 C)  Resp: 16   GEN: In NAD: WN,WD. HENT: [place]/AT; EOMI w/ clear conj/sclerae. Otherwise unremarkable. SKIN: W&D; intact. Submandibular area- 6-7 mm NT subcutaneous cystic mass. COR: RRR. LUNGS: Unlabored resp. NEURO: A&O x 3; Cns intact. Gait slightly antalgic. Nonfocal.  A/P: Single skin nodule- Suspect benign sebaceous cyst; no need for removal at this time. Will refer to [facility] if pt desires removal. Pt will sch pre-op clearance once she has surgery date w/ Dr. Ronnie Derby.

## 2013-11-16 ENCOUNTER — Other Ambulatory Visit: Payer: Self-pay | Admitting: Orthopedic Surgery

## 2013-11-16 DIAGNOSIS — M25562 Pain in left knee: Secondary | ICD-10-CM

## 2013-11-26 ENCOUNTER — Ambulatory Visit
Admission: RE | Admit: 2013-11-26 | Discharge: 2013-11-26 | Disposition: A | Discharge status: Home / Self Care | Payer: Medicare Other | Source: Ambulatory Visit | Attending: Orthopedic Surgery | Admitting: Orthopedic Surgery

## 2013-11-26 DIAGNOSIS — M25562 Pain in left knee: Secondary | ICD-10-CM

## 2013-11-30 ENCOUNTER — Ambulatory Visit (INDEPENDENT_AMBULATORY_CARE_PROVIDER_SITE_OTHER): Payer: Medicare Other | Admitting: Family Medicine

## 2013-11-30 ENCOUNTER — Encounter: Payer: Self-pay | Admitting: Family Medicine

## 2013-11-30 VITALS — BP 137/84 | HR 100 | Temp 98.9°F | Resp 18 | Ht 59.0 in | Wt 116.0 lb

## 2013-11-30 DIAGNOSIS — Z01810 Encounter for preprocedural cardiovascular examination: Secondary | ICD-10-CM

## 2013-11-30 DIAGNOSIS — M5430 Sciatica, unspecified side: Secondary | ICD-10-CM

## 2013-11-30 DIAGNOSIS — M5441 Lumbago with sciatica, right side: Secondary | ICD-10-CM

## 2013-11-30 NOTE — Patient Instructions (Signed)
I will send a letter to Dr. Ronnie Derby clearing you for partial knee replacement.

## 2013-12-02 ENCOUNTER — Encounter: Payer: Self-pay | Admitting: Family Medicine

## 2013-12-02 NOTE — Progress Notes (Signed)
S:  This 68 y.o. Cauc female is here for surgical clearance; she has agreed to proceed with partial knee replacement to be performed by Dr. Ronnie Derby.  Pt has a hx of HTN (on no medication), osteoporosis and hx of GERD/ esophageal stricture. She has some mild upper respiratory congestion and airway dysfunction that results in mouth-breathing. Overall, given her age, she is in good health.  Pt had complete physical exam in October 2014. ECG at that time was normal with normal sinus rhythm and no ST-TW changes or ectopy. Pt has no cardiac symptoms. She does not report diaphoresis, fatigue, anorexia, abnormal weight change, vision disturbances, CP or tightness, palpitations, SOB or DOE, edema, cough, orthopnea, abd or flank pain, bladder or bowel problems, HA, dizziness, weakness, numbness, seizures, tremor or syncope.  Pt reports her sister will be with her in the home after her surgery. She has some mild apprehension but is confident that she has a good Psychologist, sport and exercise.  Patient Active Problem List   Diagnosis Date Noted  . UNSPECIFIED ASTHMA 03/14/2013  . OSTEOPOROSIS 06/22/2011  . Psoriasis 06/22/2011  . Lower back pain 06/22/2011  . Vitamin d deficiency 06/22/2011  . DEPRESSION 06/30/2007  . HYPERTENSION 06/30/2007  . ARTHRITIS 06/30/2007  . ALLERGIC RHINITIS 01/19/2007  . GERD 01/19/2007  . BREAST CANCER, HX OF 01/19/2007  . ESOPHAGEAL STRICTURE 08/24/2004  . DIVERTICULOSIS OF COLON 07/28/2004   Prior to Admission medications   Medication Sig Start Date End Date Taking? Authorizing Kyrstyn Greear  alendronate (FOSAMAX) 70 MG tablet TAKE 1 TABLET EVERY 7 DAYS WITH A FULL GLASS OF WATER ON AN EMPTY STOMACH 10/11/13  Yes Barton Fanny, MD  Calcium Citrate-Vitamin D (CITRACAL + D PO) Take by mouth.   Yes Historical Bernarr Longsworth, MD  fluticasone (FLONASE) 50 MCG/ACT nasal spray USE 1-2 SPRAYS INTO THE NOSE DAILY 09/19/13  Yes Barton Fanny, MD  Fluticasone-Salmeterol (ADVAIR DISKUS) 250-50 MCG/DOSE AEPB  Inhale 1 puff into the lungs 2 (two) times daily. 03/14/13  Yes Barton Fanny, MD  loratadine (CLARITIN) 10 MG tablet Take 10 mg by mouth daily.   Yes Historical Nasir Bright, MD  montelukast (SINGULAIR) 10 MG tablet Take 1 tablet (10 mg total) by mouth at bedtime. 03/14/13  Yes Barton Fanny, MD  Multiple Vitamin (MULTIVITAMIN) tablet Take 1 tablet by mouth daily.   Yes Historical Tokiko Diefenderfer, MD  Naproxen Sodium (ALEVE PO) Take by mouth as needed.   Yes Historical Beatrice Ziehm, MD  OVER THE COUNTER MEDICATION OTC Systane eye drops taking   Yes Historical Yazmeen Woolf, MD  OVER THE COUNTER MEDICATION [place] eye drops taking   Yes Historical Deno Sida, MD   PMHx, Surg Hx, Soc and Fam Hx reviewed.  ROS: As per HPI.  O: Filed Vitals:   11/30/13 1443  BP: 137/84  Pulse: 100  Temp: 98.9 F (37.2 C)  Resp: 18   GEN: In NAD; WN,WD. HEENT: [place]/AT; EOMI w/ clear conj/sclerae. Ext ears/EACs/nares and nasal mucosa/ oroph unremarkable. NECK: Supple w/o LAN or TMG. COR: RRR. Normal S1 and S2 w/o m/g/r. LUNGS: CTA; no wheezes or rales. No resp distress. SKIN: W&D; intact w/o diaphoresis, erythema or pallor. BACK: No CVAT. ABD: Normal appearance- soft and nontender; no guarding; no abd bruit or pulsatile mass;  no masses or HSM. NEURO: A&O x 3; CNs intact. Gait- antalgic. Nonfocal.  A/P: Pre-operative cardiovascular examination- Pt at low risk for major cardiac event during orthopedic procedure.  Right-sided low back pain with right-sided sciatica

## 2013-12-19 ENCOUNTER — Other Ambulatory Visit (HOSPITAL_COMMUNITY): Payer: Self-pay | Admitting: *Deleted

## 2013-12-19 ENCOUNTER — Other Ambulatory Visit: Payer: Self-pay | Admitting: Orthopedic Surgery

## 2013-12-19 ENCOUNTER — Encounter (HOSPITAL_COMMUNITY): Payer: Self-pay | Admitting: Pharmacy Technician

## 2013-12-19 NOTE — Pre-Procedure Instructions (Signed)
LEGACI TARMAN  12/19/2013   Your procedure is scheduled on:  Wednesday, December 26, 2013 at 8:30 AM.   Report to [facility] Entrance "A" Admitting Office at 6:30 AM.   Call this number if you have problems the morning of surgery: [phone]   Remember:   Do not eat food or drink liquids after midnight Tuesday, 12/25/13.   Take these medicines the morning of surgery with A SIP OF WATER: loratadine (CLARITIN),  Fluticasone-Salmeterol (ADVAIR DISKUS), fluticasone (FLONASE), acetaminophen (TYLENOL) - if needed.  Stop all Vitamins, and NSAIDS (Aleve, Naproxen, Ibuprofen, etc) as of today.    Do not wear jewelry, make-up or nail polish.  Do not wear lotions, powders, or perfumes. You may wear deodorant.  Do not shave 48 hours prior to surgery.   Do not bring valuables to the hospital.  [facility] is not responsible                  for any belongings or valuables.               Contacts, dentures or bridgework may not be worn into surgery.  Leave suitcase in the car. After surgery it may be brought to your room.  For patients admitted to the hospital, discharge time is determined by your                treatment team.                Special Instructions: [place] - Preparing for Surgery  Before surgery, you can play an important role.  Because skin is not sterile, your skin needs to be as free of germs as possible.  You can reduce the number of germs on you skin by washing with CHG (chlorahexidine gluconate) soap before surgery.  CHG is an antiseptic cleaner which kills germs and bonds with the skin to continue killing germs even after washing.  Please DO NOT use if you have an allergy to CHG or antibacterial soaps.  If your skin becomes reddened/irritated stop using the CHG and inform your nurse when you arrive at Short Stay.  Do not shave (including legs and underarms) for at least 48 hours prior to the first CHG shower.  You may shave your face.  Please follow these  instructions carefully:   1.  Shower with CHG Soap the night before surgery and the                                morning of Surgery.  2.  If you choose to wash your hair, wash your hair first as usual with your       normal shampoo.  3.  After you shampoo, rinse your hair and body thoroughly to remove the                      Shampoo.  4.  Use CHG as you would any other liquid soap.  You can apply chg directly       to the skin and wash gently with scrungie or a clean washcloth.  5.  Apply the CHG Soap to your body ONLY FROM THE NECK DOWN.        Do not use on open wounds or open sores.  Avoid contact with your eyes, ears, mouth and genitals (private parts).  Wash genitals (private parts) with your normal soap.  6.  Wash thoroughly, paying special attention to the area where your surgery        will be performed.  7.  Thoroughly rinse your body with warm water from the neck down.  8.  DO NOT shower/wash with your normal soap after using and rinsing off       the CHG Soap.  9.  Pat yourself dry with a clean towel.            10.  Wear clean pajamas.            11.  Place clean sheets on your bed the night of your first shower and do not        sleep with pets.  Day of Surgery  Do not apply any lotions the morning of surgery.  Please wear clean clothes to the hospital/surgery center.     Please read over the following fact sheets that you were given: Pain Booklet, Coughing and Deep Breathing, Blood Transfusion Information, MRSA Information and Surgical Site Infection Prevention

## 2013-12-20 ENCOUNTER — Ambulatory Visit (HOSPITAL_COMMUNITY)
Admission: RE | Admit: 2013-12-20 | Discharge: 2013-12-20 | Disposition: A | Discharge status: Home / Self Care | Payer: Medicare Other | Source: Ambulatory Visit | Attending: Anesthesiology | Admitting: Anesthesiology

## 2013-12-20 ENCOUNTER — Encounter (HOSPITAL_COMMUNITY)
Admission: RE | Admit: 2013-12-20 | Discharge: 2013-12-20 | Disposition: A | Discharge status: Home / Self Care | Payer: Medicare Other | Source: Ambulatory Visit | Attending: Orthopedic Surgery | Admitting: Orthopedic Surgery

## 2013-12-20 ENCOUNTER — Encounter (HOSPITAL_COMMUNITY): Payer: Self-pay

## 2013-12-20 DIAGNOSIS — Z01818 Encounter for other preprocedural examination: Secondary | ICD-10-CM | POA: Diagnosis present

## 2013-12-20 HISTORY — DX: Headache: R51

## 2013-12-20 HISTORY — DX: Anxiety disorder, unspecified: F41.9

## 2013-12-20 HISTORY — DX: Unspecified asthma, uncomplicated: J45.909

## 2013-12-20 LAB — BASIC METABOLIC PANEL
Anion gap: 17 — ABNORMAL HIGH (ref 5–15)
BUN: 11 mg/dL (ref 6–23)
CO2: 22 mEq/L (ref 19–32)
Calcium: 10 mg/dL (ref 8.4–10.5)
Chloride: 102 mEq/L (ref 96–112)
Creatinine, Ser: 0.58 mg/dL (ref 0.50–1.10)
Glucose, Bld: 103 mg/dL — ABNORMAL HIGH (ref 70–99)
POTASSIUM: 4.3 meq/L (ref 3.7–5.3)
SODIUM: 141 meq/L (ref 137–147)

## 2013-12-20 LAB — CBC
HCT: 38.9 % (ref 36.0–46.0)
Hemoglobin: 12.7 g/dL (ref 12.0–15.0)
MCH: 29.5 pg (ref 26.0–34.0)
MCHC: 32.6 g/dL (ref 30.0–36.0)
MCV: 90.3 fL (ref 78.0–100.0)
PLATELETS: 274 10*3/uL (ref 150–400)
RBC: 4.31 MIL/uL (ref 3.87–5.11)
RDW: 12.4 % (ref 11.5–15.5)
WBC: 5.5 10*3/uL (ref 4.0–10.5)

## 2013-12-20 LAB — SURGICAL PCR SCREEN
MRSA, PCR: NEGATIVE
Staphylococcus aureus: NEGATIVE

## 2013-12-20 LAB — PROTIME-INR
INR: 1.02 (ref 0.00–1.49)
Prothrombin Time: 13.4 seconds (ref 11.6–15.2)

## 2013-12-20 LAB — TYPE AND SCREEN
ABO/RH(D): O POS
Antibody Screen: NEGATIVE

## 2013-12-20 LAB — APTT: aPTT: 32 seconds (ref 24–37)

## 2013-12-20 LAB — ABO/RH: ABO/RH(D): O POS

## 2013-12-25 MED ORDER — BUPIVACAINE LIPOSOME 1.3 % IJ SUSP
20.0000 mL | Freq: Once | INTRAMUSCULAR | Status: DC
  Filled 2013-12-25: qty 20

## 2013-12-25 MED ORDER — CEFAZOLIN SODIUM-DEXTROSE 2-3 GM-% IV SOLR
2.0000 g | INTRAVENOUS | Status: AC
  Administered 2013-12-26: 2 g via INTRAVENOUS
  Filled 2013-12-25: qty 50

## 2013-12-25 MED ORDER — SODIUM CHLORIDE 0.9 % IV SOLN
1000.0000 mg | INTRAVENOUS | Status: AC
  Administered 2013-12-26: 1000 mg via INTRAVENOUS
  Filled 2013-12-25: qty 10

## 2013-12-26 ENCOUNTER — Encounter (HOSPITAL_COMMUNITY): Payer: Medicare Other | Admitting: Critical Care Medicine

## 2013-12-26 ENCOUNTER — Encounter (HOSPITAL_COMMUNITY): Payer: Self-pay | Admitting: Critical Care Medicine

## 2013-12-26 ENCOUNTER — Inpatient Hospital Stay (HOSPITAL_COMMUNITY): Payer: Medicare Other | Admitting: Critical Care Medicine

## 2013-12-26 ENCOUNTER — Encounter (HOSPITAL_COMMUNITY)
Admission: RE | Disposition: A | Discharge status: Home Health | Payer: Self-pay | Source: Ambulatory Visit | Attending: Orthopedic Surgery

## 2013-12-26 ENCOUNTER — Inpatient Hospital Stay (HOSPITAL_COMMUNITY)
Admission: RE | Admit: 2013-12-26 | Discharge: 2013-12-27 | DRG: 470 | Disposition: A | Discharge status: Home Health | Payer: Medicare Other | Source: Ambulatory Visit | Attending: Orthopedic Surgery | Admitting: Orthopedic Surgery

## 2013-12-26 DIAGNOSIS — Z87891 Personal history of nicotine dependence: Secondary | ICD-10-CM | POA: Diagnosis not present

## 2013-12-26 DIAGNOSIS — M810 Age-related osteoporosis without current pathological fracture: Secondary | ICD-10-CM | POA: Diagnosis present

## 2013-12-26 DIAGNOSIS — D62 Acute posthemorrhagic anemia: Secondary | ICD-10-CM | POA: Diagnosis not present

## 2013-12-26 DIAGNOSIS — M1710 Unilateral primary osteoarthritis, unspecified knee: Principal | ICD-10-CM | POA: Diagnosis present

## 2013-12-26 DIAGNOSIS — M1712 Unilateral primary osteoarthritis, left knee: Secondary | ICD-10-CM | POA: Diagnosis present

## 2013-12-26 DIAGNOSIS — Z853 Personal history of malignant neoplasm of breast: Secondary | ICD-10-CM

## 2013-12-26 DIAGNOSIS — J45909 Unspecified asthma, uncomplicated: Secondary | ICD-10-CM | POA: Diagnosis present

## 2013-12-26 DIAGNOSIS — M25569 Pain in unspecified knee: Secondary | ICD-10-CM | POA: Diagnosis present

## 2013-12-26 DIAGNOSIS — Z79899 Other long term (current) drug therapy: Secondary | ICD-10-CM | POA: Diagnosis not present

## 2013-12-26 HISTORY — DX: Unilateral primary osteoarthritis, left knee: M17.12

## 2013-12-26 HISTORY — PX: PARTIAL KNEE ARTHROPLASTY: SHX2174

## 2013-12-26 LAB — CREATININE, SERUM
CREATININE: 0.53 mg/dL (ref 0.50–1.10)
GFR calc Af Amer: >90 mL/min (ref >90)
GFR calc non Af Amer: >90 mL/min (ref >90)

## 2013-12-26 LAB — CBC
HCT: 37.6 % (ref 36.0–46.0)
HEMOGLOBIN: 12.4 g/dL (ref 12.0–15.0)
MCH: 30.3 pg (ref 26.0–34.0)
MCHC: 33 g/dL (ref 30.0–36.0)
MCV: 91.9 fL (ref 78.0–100.0)
Platelets: 244 10*3/uL (ref 150–400)
RBC: 4.09 MIL/uL (ref 3.87–5.11)
RDW: 12.4 % (ref 11.5–15.5)
WBC: 7.4 10*3/uL (ref 4.0–10.5)

## 2013-12-26 SURGERY — ARTHROPLASTY, KNEE, UNICOMPARTMENTAL
Anesthesia: Regional | Site: Knee | Laterality: Left

## 2013-12-26 MED ORDER — METHOCARBAMOL 500 MG PO TABS
500.0000 mg | ORAL_TABLET | Freq: Four times a day (QID) | ORAL | Status: DC | PRN
  Filled 2013-12-26: qty 1

## 2013-12-26 MED ORDER — FENTANYL CITRATE 0.05 MG/ML IJ SOLN
INTRAMUSCULAR | Status: AC
  Filled 2013-12-26: qty 5

## 2013-12-26 MED ORDER — BUPIVACAINE-EPINEPHRINE (PF) 0.5% -1:200000 IJ SOLN
INTRAMUSCULAR | Status: AC
  Filled 2013-12-26: qty 30

## 2013-12-26 MED ORDER — NAPROXEN 250 MG PO TABS
250.0000 mg | ORAL_TABLET | Freq: Every day | ORAL | Status: DC | PRN
  Filled 2013-12-26: qty 1

## 2013-12-26 MED ORDER — BUPIVACAINE LIPOSOME 1.3 % IJ SUSP
INTRAMUSCULAR | Status: DC | PRN
  Administered 2013-12-26: 20 mL

## 2013-12-26 MED ORDER — SODIUM CHLORIDE 0.9 % IJ SOLN
INTRAMUSCULAR | Status: AC
  Filled 2013-12-26: qty 10

## 2013-12-26 MED ORDER — BUPIVACAINE-EPINEPHRINE (PF) 0.5% -1:200000 IJ SOLN
INTRAMUSCULAR | Status: DC | PRN
  Administered 2013-12-26: 30 mL via PERINEURAL

## 2013-12-26 MED ORDER — CEFAZOLIN SODIUM 1-5 GM-% IV SOLN
1.0000 g | Freq: Four times a day (QID) | INTRAVENOUS | Status: AC
  Administered 2013-12-26 (×2): 1 g via INTRAVENOUS
  Filled 2013-12-26 (×2): qty 50

## 2013-12-26 MED ORDER — ENOXAPARIN SODIUM 30 MG/0.3ML ~~LOC~~ SOLN
30.0000 mg | Freq: Two times a day (BID) | SUBCUTANEOUS | Status: DC
  Administered 2013-12-27: 30 mg via SUBCUTANEOUS
  Filled 2013-12-26 (×3): qty 0.3

## 2013-12-26 MED ORDER — BISACODYL 5 MG PO TBEC: 5.0000 mg | DELAYED_RELEASE_TABLET | Freq: Every day | ORAL | Status: DC | PRN

## 2013-12-26 MED ORDER — NEOSTIGMINE METHYLSULFATE 10 MG/10ML IV SOLN
INTRAVENOUS | Status: AC
  Filled 2013-12-26: qty 1

## 2013-12-26 MED ORDER — LIDOCAINE HCL (CARDIAC) 20 MG/ML IV SOLN
INTRAVENOUS | Status: AC
  Filled 2013-12-26: qty 5

## 2013-12-26 MED ORDER — PROPOFOL 10 MG/ML IV BOLUS
INTRAVENOUS | Status: DC | PRN
  Administered 2013-12-26: 160 mg via INTRAVENOUS

## 2013-12-26 MED ORDER — 0.9 % SODIUM CHLORIDE (POUR BTL) OPTIME
TOPICAL | Status: DC | PRN
  Administered 2013-12-26: 1000 mL

## 2013-12-26 MED ORDER — MOMETASONE FURO-FORMOTEROL FUM 100-5 MCG/ACT IN AERO
2.0000 | INHALATION_SPRAY | Freq: Two times a day (BID) | RESPIRATORY_TRACT | Status: DC
  Administered 2013-12-26 – 2013-12-27 (×2): 2 via RESPIRATORY_TRACT
  Filled 2013-12-26: qty 8.8

## 2013-12-26 MED ORDER — GLYCOPYRROLATE 0.2 MG/ML IJ SOLN
INTRAMUSCULAR | Status: AC
  Filled 2013-12-26: qty 2

## 2013-12-26 MED ORDER — PROPOFOL 10 MG/ML IV BOLUS
INTRAVENOUS | Status: AC
  Filled 2013-12-26: qty 20

## 2013-12-26 MED ORDER — FENTANYL CITRATE 0.05 MG/ML IJ SOLN
INTRAMUSCULAR | Status: DC | PRN
  Administered 2013-12-26 (×6): 50 ug via INTRAVENOUS
  Administered 2013-12-26: 100 ug via INTRAVENOUS

## 2013-12-26 MED ORDER — ACETAMINOPHEN 325 MG PO TABS: 650.0000 mg | ORAL_TABLET | Freq: Four times a day (QID) | ORAL | Status: DC | PRN

## 2013-12-26 MED ORDER — SUCCINYLCHOLINE CHLORIDE 20 MG/ML IJ SOLN
INTRAMUSCULAR | Status: AC
  Filled 2013-12-26: qty 1

## 2013-12-26 MED ORDER — METOCLOPRAMIDE HCL 5 MG/ML IJ SOLN
5.0000 mg | Freq: Three times a day (TID) | INTRAMUSCULAR | Status: DC | PRN
  Administered 2013-12-27: 10 mg via INTRAVENOUS
  Filled 2013-12-26 (×2): qty 2

## 2013-12-26 MED ORDER — OXYCODONE HCL 5 MG PO TABS
5.0000 mg | ORAL_TABLET | ORAL | Status: DC | PRN
  Administered 2013-12-26 – 2013-12-27 (×2): 10 mg via ORAL
  Filled 2013-12-26 (×2): qty 2

## 2013-12-26 MED ORDER — MENTHOL 3 MG MT LOZG: 1.0000 | LOZENGE | OROMUCOSAL | Status: DC | PRN

## 2013-12-26 MED ORDER — ROCURONIUM BROMIDE 50 MG/5ML IV SOLN
INTRAVENOUS | Status: AC
  Filled 2013-12-26: qty 1

## 2013-12-26 MED ORDER — HYDROMORPHONE HCL PF 1 MG/ML IJ SOLN
INTRAMUSCULAR | Status: AC
  Filled 2013-12-26: qty 1

## 2013-12-26 MED ORDER — ACETAMINOPHEN 650 MG RE SUPP: 650.0000 mg | Freq: Four times a day (QID) | RECTAL | Status: DC | PRN

## 2013-12-26 MED ORDER — LACTATED RINGERS IV SOLN
INTRAVENOUS | Status: DC | PRN
  Administered 2013-12-26 (×2): via INTRAVENOUS

## 2013-12-26 MED ORDER — NAPROXEN SODIUM 220 MG PO TABS: 220.0000 mg | ORAL_TABLET | Freq: Every day | ORAL | Status: DC | PRN

## 2013-12-26 MED ORDER — EPHEDRINE SULFATE 50 MG/ML IJ SOLN
INTRAMUSCULAR | Status: DC | PRN
  Administered 2013-12-26: 10 mg via INTRAVENOUS
  Administered 2013-12-26: 5 mg via INTRAVENOUS

## 2013-12-26 MED ORDER — LIDOCAINE HCL (CARDIAC) 20 MG/ML IV SOLN
INTRAVENOUS | Status: DC | PRN
  Administered 2013-12-26: 60 mg via INTRAVENOUS

## 2013-12-26 MED ORDER — ONDANSETRON HCL 4 MG/2ML IJ SOLN
INTRAMUSCULAR | Status: AC
  Filled 2013-12-26: qty 2

## 2013-12-26 MED ORDER — SODIUM CHLORIDE 0.9 % IR SOLN
Status: DC | PRN
  Administered 2013-12-26: 1000 mL

## 2013-12-26 MED ORDER — ONDANSETRON HCL 4 MG/2ML IJ SOLN
4.0000 mg | Freq: Four times a day (QID) | INTRAMUSCULAR | Status: DC | PRN
  Administered 2013-12-27: 4 mg via INTRAVENOUS
  Filled 2013-12-26: qty 2

## 2013-12-26 MED ORDER — LORATADINE 10 MG PO TABS
10.0000 mg | ORAL_TABLET | Freq: Every day | ORAL | Status: DC
  Administered 2013-12-27: 10 mg via ORAL
  Filled 2013-12-26: qty 1

## 2013-12-26 MED ORDER — ROCURONIUM BROMIDE 100 MG/10ML IV SOLN
INTRAVENOUS | Status: DC | PRN
  Administered 2013-12-26: 40 mg via INTRAVENOUS

## 2013-12-26 MED ORDER — NEOSTIGMINE METHYLSULFATE 10 MG/10ML IV SOLN
INTRAVENOUS | Status: DC | PRN
  Administered 2013-12-26: 3 mg via INTRAVENOUS

## 2013-12-26 MED ORDER — METHOCARBAMOL 1000 MG/10ML IJ SOLN
500.0000 mg | Freq: Four times a day (QID) | INTRAVENOUS | Status: DC | PRN
  Filled 2013-12-26: qty 5

## 2013-12-26 MED ORDER — ZOLPIDEM TARTRATE 5 MG PO TABS: 5.0000 mg | ORAL_TABLET | Freq: Every evening | ORAL | Status: DC | PRN

## 2013-12-26 MED ORDER — MONTELUKAST SODIUM 10 MG PO TABS
10.0000 mg | ORAL_TABLET | Freq: Every day | ORAL | Status: DC
  Administered 2013-12-26: 10 mg via ORAL
  Filled 2013-12-26 (×2): qty 1

## 2013-12-26 MED ORDER — DEXAMETHASONE SODIUM PHOSPHATE 4 MG/ML IJ SOLN
INTRAMUSCULAR | Status: AC
  Filled 2013-12-26: qty 1

## 2013-12-26 MED ORDER — MIDAZOLAM HCL 5 MG/5ML IJ SOLN
INTRAMUSCULAR | Status: DC | PRN
  Administered 2013-12-26: 2 mg via INTRAVENOUS

## 2013-12-26 MED ORDER — ALUM & MAG HYDROXIDE-SIMETH 200-200-20 MG/5ML PO SUSP: 30.0000 mL | ORAL | Status: DC | PRN

## 2013-12-26 MED ORDER — HYDROMORPHONE HCL PF 1 MG/ML IJ SOLN
0.2500 mg | INTRAMUSCULAR | Status: DC | PRN
  Administered 2013-12-26 (×2): 0.25 mg via INTRAVENOUS

## 2013-12-26 MED ORDER — EPHEDRINE SULFATE 50 MG/ML IJ SOLN
INTRAMUSCULAR | Status: AC
  Filled 2013-12-26: qty 1

## 2013-12-26 MED ORDER — DOCUSATE SODIUM 100 MG PO CAPS
100.0000 mg | ORAL_CAPSULE | Freq: Two times a day (BID) | ORAL | Status: DC
Start: 2013-12-26 — End: 2013-12-27
  Administered 2013-12-26 – 2013-12-27 (×2): 100 mg via ORAL
  Filled 2013-12-26 (×3): qty 1

## 2013-12-26 MED ORDER — GLYCOPYRROLATE 0.2 MG/ML IJ SOLN
INTRAMUSCULAR | Status: DC | PRN
  Administered 2013-12-26: 0.4 mg via INTRAVENOUS

## 2013-12-26 MED ORDER — METOCLOPRAMIDE HCL 5 MG PO TABS
5.0000 mg | ORAL_TABLET | Freq: Three times a day (TID) | ORAL | Status: DC | PRN
  Filled 2013-12-26: qty 2

## 2013-12-26 MED ORDER — FLEET ENEMA 7-19 GM/118ML RE ENEM: 1.0000 | ENEMA | Freq: Once | RECTAL | Status: AC | PRN

## 2013-12-26 MED ORDER — OXYCODONE HCL ER 10 MG PO T12A
10.0000 mg | EXTENDED_RELEASE_TABLET | Freq: Two times a day (BID) | ORAL | Status: DC
  Administered 2013-12-26 – 2013-12-27 (×2): 10 mg via ORAL
  Filled 2013-12-26 (×3): qty 1

## 2013-12-26 MED ORDER — MIDAZOLAM HCL 2 MG/2ML IJ SOLN
INTRAMUSCULAR | Status: AC
  Filled 2013-12-26: qty 2

## 2013-12-26 MED ORDER — PHENOL 1.4 % MT LIQD: 1.0000 | OROMUCOSAL | Status: DC | PRN

## 2013-12-26 MED ORDER — SENNOSIDES-DOCUSATE SODIUM 8.6-50 MG PO TABS: 1.0000 | ORAL_TABLET | Freq: Every evening | ORAL | Status: DC | PRN

## 2013-12-26 MED ORDER — ONDANSETRON HCL 4 MG/2ML IJ SOLN
INTRAMUSCULAR | Status: DC | PRN
  Administered 2013-12-26: 4 mg via INTRAVENOUS

## 2013-12-26 MED ORDER — ONDANSETRON HCL 4 MG PO TABS: 4.0000 mg | ORAL_TABLET | Freq: Four times a day (QID) | ORAL | Status: DC | PRN

## 2013-12-26 MED ORDER — HYDROMORPHONE HCL PF 1 MG/ML IJ SOLN: 1.0000 mg | INTRAMUSCULAR | Status: DC | PRN

## 2013-12-26 MED ORDER — FLUTICASONE PROPIONATE 50 MCG/ACT NA SUSP
1.0000 | Freq: Every day | NASAL | Status: DC
  Filled 2013-12-26: qty 16

## 2013-12-26 MED ORDER — DIPHENHYDRAMINE HCL 12.5 MG/5ML PO ELIX: 12.5000 mg | ORAL_SOLUTION | ORAL | Status: DC | PRN

## 2013-12-26 MED ORDER — ARTIFICIAL TEARS OP OINT
TOPICAL_OINTMENT | OPHTHALMIC | Status: AC
  Filled 2013-12-26: qty 3.5

## 2013-12-26 MED ORDER — SODIUM CHLORIDE 0.9 % IV SOLN: INTRAVENOUS | Status: DC

## 2013-12-26 MED ORDER — ALENDRONATE SODIUM 70 MG PO TABS: 70.0000 mg | ORAL_TABLET | ORAL | Status: DC

## 2013-12-26 SURGICAL SUPPLY — 77 items
BANDAGE ESMARK 6X9 LF (GAUZE/BANDAGES/DRESSINGS) ×1 IMPLANT
BLADE SAW RECIP 87.9 MT (BLADE) ×3 IMPLANT
BLADE SAW SGTL 13X75X1.27 (BLADE) ×3 IMPLANT
BLADE SAW SGTL 83.5X18.5 (BLADE) ×3 IMPLANT
BNDG CMPR 9X6 STRL LF SNTH (GAUZE/BANDAGES/DRESSINGS) ×1
BNDG CMPR MED 10X6 ELC LF (GAUZE/BANDAGES/DRESSINGS) ×1
BNDG ELASTIC 6X10 VLCR STRL LF (GAUZE/BANDAGES/DRESSINGS) ×3 IMPLANT
BNDG ESMARK 6X9 LF (GAUZE/BANDAGES/DRESSINGS) ×3
BOWL SMART MIX CTS (DISPOSABLE) ×3 IMPLANT
CAP BONE MODELS KNEE (Capitated) ×2 IMPLANT
CAP CEM FLX UNI FM/TB/SUR HD ×2 IMPLANT
CEMENT BONE SIMPLEX SPEEDSET (Cement) ×3 IMPLANT
CLOSURE STERI-STRIP 1/2X4 (GAUZE/BANDAGES/DRESSINGS) ×1
CLSR STERI-STRIP ANTIMIC 1/2X4 (GAUZE/BANDAGES/DRESSINGS) ×1 IMPLANT
COVER BACK TABLE 24X17X13 BIG (DRAPES) IMPLANT
COVER SURGICAL LIGHT HANDLE (MISCELLANEOUS) ×4 IMPLANT
CUFF TOURNIQUET SINGLE 34IN LL (TOURNIQUET CUFF) ×3 IMPLANT
DRAPE C-ARM 42X72 X-RAY (DRAPES) ×1 IMPLANT
DRAPE EXTREMITY T 121X128X90 (DRAPE) ×3 IMPLANT
DRAPE INCISE IOBAN 66X45 STRL (DRAPES) ×7 IMPLANT
DRAPE PROXIMA HALF (DRAPES) ×1 IMPLANT
DRAPE U-SHAPE 47X51 STRL (DRAPES) ×3 IMPLANT
DRSG ADAPTIC 3X8 NADH LF (GAUZE/BANDAGES/DRESSINGS) ×3 IMPLANT
DRSG PAD ABDOMINAL 8X10 ST (GAUZE/BANDAGES/DRESSINGS) ×3 IMPLANT
DURAPREP 26ML APPLICATOR (WOUND CARE) ×6 IMPLANT
ELECT REM PT RETURN 9FT ADLT (ELECTROSURGICAL) ×3
ELECTRODE REM PT RTRN 9FT ADLT (ELECTROSURGICAL) ×1 IMPLANT
EVACUATOR 1/8 PVC DRAIN (DRAIN) ×1 IMPLANT
FLUID NSS /IRRIG 3000 ML XXX (IV SOLUTION) ×1 IMPLANT
GLOVE BIOGEL M 7.0 STRL (GLOVE) IMPLANT
GLOVE BIOGEL PI IND STRL 7.5 (GLOVE) IMPLANT
GLOVE BIOGEL PI IND STRL 8 (GLOVE) IMPLANT
GLOVE BIOGEL PI IND STRL 8.5 (GLOVE) ×2 IMPLANT
GLOVE BIOGEL PI INDICATOR 7.5 (GLOVE) ×4
GLOVE BIOGEL PI INDICATOR 8 (GLOVE) ×2
GLOVE BIOGEL PI INDICATOR 8.5 (GLOVE) ×4
GLOVE SURG ORTHO 8.0 STRL STRW (GLOVE) ×6 IMPLANT
GLOVE SURG SS PI 7.5 STRL IVOR (GLOVE) ×2 IMPLANT
GOWN STRL REUS W/ TWL LRG LVL3 (GOWN DISPOSABLE) ×2 IMPLANT
GOWN STRL REUS W/ TWL XL LVL3 (GOWN DISPOSABLE) ×2 IMPLANT
GOWN STRL REUS W/TWL LRG LVL3 (GOWN DISPOSABLE) ×3
GOWN STRL REUS W/TWL XL LVL3 (GOWN DISPOSABLE) ×6
HANDPIECE INTERPULSE COAX TIP (DISPOSABLE) ×3
HOOD PEEL AWAY FACE SHEILD DIS (HOOD) ×9 IMPLANT
KIT BASIN OR (CUSTOM PROCEDURE TRAY) ×3 IMPLANT
KIT ROOM TURNOVER OR (KITS) ×3 IMPLANT
MANIFOLD NEPTUNE II (INSTRUMENTS) ×3 IMPLANT
NDL HYPO 21X1 ECLIPSE (NEEDLE) ×1 IMPLANT
NEEDLE 21X1 OR PACK (NEEDLE) ×1 IMPLANT
NEEDLE 22X1 1/2 (OR ONLY) (NEEDLE) ×4 IMPLANT
NEEDLE HYPO 21X1 ECLIPSE (NEEDLE) IMPLANT
NS IRRIG 1000ML POUR BTL (IV SOLUTION) ×3 IMPLANT
PACK TOTAL JOINT (CUSTOM PROCEDURE TRAY) ×3 IMPLANT
PAD ARMBOARD 7.5X6 YLW CONV (MISCELLANEOUS) ×4 IMPLANT
PADDING CAST COTTON 6X4 STRL (CAST SUPPLIES) ×3 IMPLANT
SET HNDPC FAN SPRY TIP SCT (DISPOSABLE) ×1 IMPLANT
SPONGE GAUZE 4X4 12PLY (GAUZE/BANDAGES/DRESSINGS) ×1 IMPLANT
STAPLER VISISTAT 35W (STAPLE) ×3 IMPLANT
SUCTION FRAZIER TIP 10 FR DISP (SUCTIONS) ×3 IMPLANT
SUT MNCRL AB 4-0 PS2 18 (SUTURE) ×2 IMPLANT
SUT VIC AB 0 CT1 27 (SUTURE) ×6
SUT VIC AB 0 CT1 27XBRD ANBCTR (SUTURE) ×2 IMPLANT
SUT VIC AB 1 CT1 27 (SUTURE) ×6
SUT VIC AB 1 CT1 27XBRD ANBCTR (SUTURE) ×1 IMPLANT
SUT VIC AB 2-0 CT1 27 (SUTURE) ×6
SUT VIC AB 2-0 CT1 TAPERPNT 27 (SUTURE) ×1 IMPLANT
SYR 50ML LL SCALE MARK (SYRINGE) IMPLANT
SYR CONTROL 10ML LL (SYRINGE) ×3 IMPLANT
SYRINGE CONTROL L 12CC (SYRINGE) ×3 IMPLANT
SYRINGE CONTROL LL 12CC (SYRINGE) IMPLANT
TOWEL BLUE STERILE X RAY DET (MISCELLANEOUS) ×2 IMPLANT
TOWEL OR 17X24 6PK STRL BLUE (TOWEL DISPOSABLE) ×3 IMPLANT
TOWEL OR 17X26 10 PK STRL BLUE (TOWEL DISPOSABLE) ×3 IMPLANT
TOWEL OR NON WOVEN STRL DISP B (DISPOSABLE) ×4 IMPLANT
WATER STERILE IRR 1000ML POUR (IV SOLUTION) ×3 IMPLANT
Zimmer Unicompartmental Knee system Tibial compone (Knees) ×2 IMPLANT
Zimmer Unicompartmental knee system Femoral compon (Knees) ×1 IMPLANT

## 2013-12-26 NOTE — Progress Notes (Signed)
Orthopedic Tech Progress Note Patient Details:  Alyssa Nielsen [birth year] [id number]  Patient ID: Alyssa Nielsen, female   DOB: 16-Aug-1945, 68 y.o.   MRN: [id number] Placed pt's lle in cpm @ 0-60 degrees @ 2025  Hildred Priest 12/26/2013, 8:20 PM

## 2013-12-26 NOTE — Progress Notes (Signed)
Utilization review completed.  

## 2013-12-26 NOTE — H&P (Signed)
Alyssa Nielsen MRN:  [id number] DOB/SEX:  1946/03/13/female  CHIEF COMPLAINT:  Painful left Knee  HISTORY: Patient is a 68 y.o. female presented with a history of pain in the left knee. Onset of symptoms was gradual starting several years ago with gradually worsening course since that time. Prior procedures on the knee include none. Patient has been treated conservatively with over-the-counter NSAIDs and activity modification. Patient currently rates pain in the knee at 8 out of 10 with activity. There is no pain at night.  PAST MEDICAL HISTORY: Patient Active Problem List   Diagnosis Date Noted  . UNSPECIFIED ASTHMA 03/14/2013  . OSTEOPOROSIS 06/22/2011  . Psoriasis 06/22/2011  . Lower back pain 06/22/2011  . Vitamin d deficiency 06/22/2011  . DEPRESSION 06/30/2007  . HYPERTENSION 06/30/2007  . ARTHRITIS 06/30/2007  . ALLERGIC RHINITIS 01/19/2007  . GERD 01/19/2007  . BREAST CANCER, HX OF 01/19/2007  . ESOPHAGEAL STRICTURE 08/24/2004  . DIVERTICULOSIS OF COLON 07/28/2004   Past Medical History  Diagnosis Date  . Allergy   . GERD (gastroesophageal reflux disease)   . Ulcer     STOMACH  . Osteoporosis   . Asthma   . Anxiety   . Headache(784.0)   . Arthritis 2011 and 2013    spine and L knee  . Cancer     breast cancer   Past Surgical History  Procedure Laterality Date  . Breast surgery  1999  1996    R BREAST (CANCEROUS)  L BREAST (BENIGN)     . Tonsillectomy      AGE 89  . Mouth surgery      WISDOM TEETH REMOVAL     MEDICATIONS:   Prescriptions prior to admission  Medication Sig Dispense Refill  . acetaminophen (TYLENOL) 500 MG tablet Take 500 mg by mouth every 8 (eight) hours as needed for moderate pain.      Marland Kitchen alendronate (FOSAMAX) 70 MG tablet Take 70 mg by mouth once a week. On Sunday   -   Take with a full glass of water on an empty stomach.      Marland Kitchen aluminum & magnesium hydroxide-simethicone (MYLANTA) 500-450-40 MG/5ML suspension Take by mouth every 6 (six)  hours as needed for indigestion.      . Calcium Citrate-Vitamin D (CITRACAL + D PO) Take 1 tablet by mouth daily.       . fluticasone (FLONASE) 50 MCG/ACT nasal spray Place 1 spray into both nostrils daily.      . Fluticasone-Salmeterol (ADVAIR DISKUS) 250-50 MCG/DOSE AEPB Inhale 1 puff into the lungs 2 (two) times daily.  60 each  11  . Ketotifen Fumarate (ALAWAY OP) Place 1 drop into both eyes daily as needed (for allergies / itching).      Marland Kitchen loratadine (CLARITIN) 10 MG tablet Take 10 mg by mouth daily.      . montelukast (SINGULAIR) 10 MG tablet Take 1 tablet (10 mg total) by mouth at bedtime.  30 tablet  11  . Multiple Vitamin (MULTIVITAMIN) tablet Take 1 tablet by mouth daily.      . naproxen sodium (ANAPROX) 220 MG tablet Take 220 mg by mouth daily as needed (for pain).      Vladimir Faster Glycol-Propyl Glycol (SYSTANE OP) Place 1 drop into both eyes daily as needed (for dry eyes).        ALLERGIES:   Allergies  Allergen Reactions  . Aspirin Other (See Comments)    GI UPSET-STOMACH BURNS  . Ibuprofen Other (See Comments)  STOMACH BURNS SLIGHTLY    REVIEW OF SYSTEMS:  Pertinent items are noted in HPI.   FAMILY HISTORY:   Family History  Problem Relation Age of Onset  . Hypertension Mother   . Heart disease Mother   . Stroke Mother     HEMORRAGHIC  . Cancer Father 73    PANCREATIC  . Hypertension Father   . Hypertension Sister   . Thyroid disease Sister   . Stroke Sister   . Hypertension Brother     SOCIAL HISTORY:   History  Substance Use Topics  . Smoking status: Former Smoker -- 2 years    Types: Cigarettes    Quit date: 06/07/1973  . Smokeless tobacco: Not on file  . Alcohol Use: No     EXAMINATION:  Vital signs in last 24 hours: Temp:  [98.1 F (36.7 C)] 98.1 F (36.7 C) (07/22 0653) Pulse Rate:  [82] 82 (07/22 0653) Resp:  [18] 18 (07/22 0653) BP: (144)/(83) 144/83 mmHg (07/22 0653) SpO2:  [98 %] 98 % (07/22 0653) Weight:  [52.164 kg (115 lb)]  52.164 kg (115 lb) (07/22 0653)  General appearance: alert, cooperative and no distress Lungs: clear to auscultation bilaterally Heart: regular rate and rhythm, S1, S2 normal, no murmur, click, rub or gallop Abdomen: soft, non-tender; bowel sounds normal; no masses,  no organomegaly Extremities: extremities normal, atraumatic, no cyanosis or edema and Homans sign is negative, no sign of DVT Pulses: 2+ and symmetric Skin: Skin color, texture, turgor normal. No rashes or lesions Neurologic: Alert and oriented X 3, normal strength and tone. Normal symmetric reflexes. Normal coordination and gait  Musculoskeletal:  ROM 0-115, Ligaments intact,  Imaging Review Plain radiographs demonstrate severe degenerative joint disease of the left knee, medial compartment. The overall alignment is mild valgus. The bone quality appears to be good for age and reported activity level.  Assessment/Plan: End stage arthritis, left knee   The patient history, physical examination and imaging studies are consistent with advanced degenerative joint disease of the left knee, medial compartment. The patient has failed conservative treatment.  The clearance notes were reviewed.  After discussion with the patient it was felt that Uni Knee Replacement was indicated. The procedure,  risks, and benefits of total knee arthroplasty were presented and reviewed. The risks including but not limited to aseptic loosening, infection, blood clots, vascular injury, stiffness, patella tracking problems complications among others were discussed. The patient acknowledged the explanation, agreed to proceed with the plan.  Alyssa Nielsen 12/26/2013, 8:18 AM

## 2013-12-26 NOTE — Progress Notes (Signed)
PHARMACIST - PHYSICIAN COMMUNICATION  CONCERNING: P&T Medication Policy Regarding Oral Bisphosphonates  RECOMMENDATION: Your order for alendronate (Fosamax) has been discontinued at this time.  If the patient's post-hospital medical condition warrants safe use of this class of drugs, please resume the pre-hospital regimen upon discharge.  DESCRIPTION:  Alendronate (Fosamax) can cause severe esophageal erosions in patients who are unable to remain upright at least 30 minutes after taking this medication.   Since brief interruptions in therapy are thought to have minimal impact on bone mineral density, the [facility] has established that bisphosphonate orders should be routinely discontinued during hospitalization.   To override this safety policy and permit administration of Fosamax (or Actonel or Boniva) in the hospital, prescribers must write "DO NOT HOLD" in the comments section when placing the order for this class of medications.  Alyssa Nielsen, RPh Pager: [phone] 12/26/2013 2:24 PM

## 2013-12-26 NOTE — Anesthesia Postprocedure Evaluation (Signed)
  Anesthesia Post-op Note  Patient: Alyssa Nielsen  Procedure(s) Performed: Procedure(s): UNICOMPARTMENTAL KNEE (Left)  Patient Location: PACU  Anesthesia Type:General  Level of Consciousness: awake  Airway and Oxygen Therapy: Patient Spontanous Breathing  Post-op Pain: mild  Post-op Assessment: Post-op Vital signs reviewed  Post-op Vital Signs: Reviewed  Last Vitals:  Filed Vitals:   12/26/13 1115  BP:   Pulse:   Temp: 36.2 C  Resp:     Complications: No apparent anesthesia complications

## 2013-12-26 NOTE — Progress Notes (Signed)
Orthopedic Tech Progress Note Patient Details:  Alyssa Nielsen 13-Dec-1945 [id number]  CPM Left Knee CPM Left Knee: On Left Knee Flexion (Degrees): 90 Left Knee Extension (Degrees): 0 Additional Comments: Trapeze bar and foot roll   Alyssa Nielsen 12/26/2013, 12:48 PM

## 2013-12-26 NOTE — Evaluation (Signed)
Physical Therapy Evaluation Patient Details Name: Alyssa Nielsen MRN: [id number] DOB: June 06, 1946 Today's Date: 12/26/2013   History of Present Illness  Pt admitted for elective L Unicompartmental Knee.  Clinical Impression  Pt admitted with/for elective L UNI Knee.  Pt currently limited functionally due to the problems listed below.  (see problems list.)  Pt will benefit from PT to maximize function and safety to be able to get home safely with available assist of family.     Follow Up Recommendations Home health PT    Equipment Recommendations  None recommended by PT    Recommendations for Other Services       Precautions / Restrictions Precautions Precautions: Knee Restrictions Weight Bearing Restrictions: Yes LLE Weight Bearing: Weight bearing as tolerated      Mobility  Bed Mobility               General bed mobility comments: in chair  Transfers Overall transfer level: Needs assistance Equipment used: Rolling walker (2 wheeled) Transfers: Sit to/from Stand;Stand Pivot Transfers Sit to Stand: Min guard Stand pivot transfers: Min guard       General transfer comment: cues for hand placement  Ambulation/Gait Ambulation/Gait assistance: Min guard Ambulation Distance (Feet): 120 Feet (times 2) Assistive device: Rolling walker (2 wheeled) Gait Pattern/deviations: Step-to pattern;Step-through pattern     General Gait Details: Smooth progress from "step to" to step through gait pattern  Stairs            Wheelchair Mobility    Modified Rankin (Stroke Patients Only)       Balance                                             Pertinent Vitals/Pain Up to 5/10    Home Living Family/patient expects to be discharged to:: Private residence Living Arrangements: Other relatives Available Help at Discharge: Family Type of Home: House Home Access: Stairs to enter Entrance Stairs-Rails: None Entrance Stairs-Number of Steps:  1 Home Layout: One level Home Equipment: Environmental consultant - 2 wheels;Bedside commode      Prior Function Level of Independence: Independent               Hand Dominance        Extremity/Trunk Assessment   Upper Extremity Assessment: Overall WFL for tasks assessed           Lower Extremity Assessment: Overall WFL for tasks assessed;LLE deficits/detail   LLE Deficits / Details: Self assisted ROM in flexion to ~115*     Communication   Communication: No difficulties  Cognition Arousal/Alertness: Awake/alert Behavior During Therapy: WFL for tasks assessed/performed Overall Cognitive Status: Within Functional Limits for tasks assessed                      General Comments      Exercises Total Joint Exercises Ankle Circles/Pumps: 10 reps;AROM;Both;Seated Quad Sets: AROM;Both;10 reps;Seated Towel Squeeze: AROM;Both;5 reps;Seated Short Arc Quad: AROM;Left;5 reps;Seated Heel Slides: AAROM;Left;10 reps;Seated Hip ABduction/ADduction: Other (comment) (demo'd and discussed, but not done) Straight Leg Raises: AAROM;Left;5 reps;Seated Knee Flexion: AROM;Left;5 reps;Seated Goniometric ROM: ~115*      Assessment/Plan    PT Assessment Patient needs continued PT services  PT Diagnosis Acute pain   PT Problem List Decreased range of motion;Decreased strength;Decreased mobility;Decreased knowledge of use of DME;Pain  PT Treatment Interventions Gait training;DME instruction;Functional mobility  training;Therapeutic activities;Patient/family education   PT Goals (Current goals can be found in the Care Plan section) Acute Rehab PT Goals Patient Stated Goal: back independent and activite PT Goal Formulation: With patient Time For Goal Achievement: 01/02/14 Potential to Achieve Goals: Good    Frequency 7X/week   Barriers to discharge        Co-evaluation               End of Session   Activity Tolerance: Patient tolerated treatment well Patient left: in  chair;with call bell/phone within reach Nurse Communication: Mobility status         Time: 1700-1732 PT Time Calculation (min): 32 min   Charges:   PT Evaluation $Initial PT Evaluation Tier I: 1 Procedure PT Treatments $Gait Training: 8-22 mins $Therapeutic Exercise: 8-22 mins   PT G Codes:          Keltie Labell, Tessie Fass 12/26/2013, 5:41 PM 12/26/2013  Donnella Sham, PT [phone]  (pager)

## 2013-12-26 NOTE — Progress Notes (Signed)
Received report from [place] RN at this time

## 2013-12-26 NOTE — Anesthesia Preprocedure Evaluation (Addendum)
Anesthesia Evaluation  Patient identified by MRN, date of birth, ID band Patient awake    History of Anesthesia Complications (+) AWARENESS UNDER ANESTHESIA  Airway Mallampati: I TM Distance: >3 FB Neck ROM: Full    Dental  (+) Dental Advisory Given, Teeth Intact   Pulmonary asthma , former smoker,  breath sounds clear to auscultation        Cardiovascular hypertension (not on medications), Rhythm:Regular Rate:Normal     Neuro/Psych  Headaches, PSYCHIATRIC DISORDERS Anxiety Depression    GI/Hepatic Neg liver ROS, GERD-  Medicated,  Endo/Other  negative endocrine ROS  Renal/GU negative Renal ROS     Musculoskeletal   Abdominal   Peds  Hematology   Anesthesia Other Findings   Reproductive/Obstetrics                        Anesthesia Physical Anesthesia Plan  ASA: III  Anesthesia Plan: General and Regional   Post-op Pain Management:    Induction: Intravenous  Airway Management Planned: Oral ETT  Additional Equipment:   Intra-op Plan:   Post-operative Plan: Extubation in OR  Informed Consent: I have reviewed the patients History and Physical, chart, labs and discussed the procedure including the risks, benefits and alternatives for the proposed anesthesia with the patient or authorized representative who has indicated his/her understanding and acceptance.   Dental advisory given  Plan Discussed with: Anesthesiologist, Surgeon and CRNA  Anesthesia Plan Comments:       Anesthesia Quick Evaluation

## 2013-12-26 NOTE — Anesthesia Procedure Notes (Addendum)
Procedure Name: Intubation Date/Time: 12/26/2013 8:32 AM Performed by: Carola Frost Pre-anesthesia Checklist: Patient identified, Timeout performed, Suction available, Emergency Drugs available and Patient being monitored Patient Re-evaluated:Patient Re-evaluated prior to inductionOxygen Delivery Method: Circle system utilized Preoxygenation: Pre-oxygenation with 100% oxygen Intubation Type: IV induction and Cricoid Pressure applied Ventilation: Mask ventilation without difficulty and Oral airway inserted - appropriate to patient size Laryngoscope Size: Mac and 3 Grade View: Grade II Tube type: Oral Tube size: 7.0 mm Number of attempts: 1 Airway Equipment and Method: Stylet Placement Confirmation: CO2 detector,  positive ETCO2,  ETT inserted through vocal cords under direct vision and breath sounds checked- equal and bilateral Secured at: 21 cm Tube secured with: Tape Dental Injury: Teeth and Oropharynx as per pre-operative assessment    Anesthesia Regional Block:  Adductor canal block  Pre-Anesthetic Checklist: ,, timeout performed, Correct Patient, Correct Site, Correct Laterality, Correct Procedure, Correct Position, site marked, Risks and benefits discussed,  Surgical consent,  Pre-op evaluation,  At surgeon's request and post-op pain management  Laterality: Left  Prep: chloraprep       Needles:   Needle Type: Stimulator Needle - 80          Additional Needles:  Procedures: Doppler guided and nerve stimulator Adductor canal block  Nerve Stimulator or Paresthesia:  Response: 0.5 mA,   Additional Responses:   Narrative:  Start time: 12/26/2013 7:55 AM End time: 12/26/2013 8:10 AM Injection made incrementally with aspirations every 5 mL.  Performed by: Personally  Anesthesiologist: Dr. Oletta Lamas

## 2013-12-26 NOTE — Transfer of Care (Signed)
Immediate Anesthesia Transfer of Care Note  Patient: Alyssa Nielsen  Procedure(s) Performed: Procedure(s): UNICOMPARTMENTAL KNEE (Left)  Patient Location: PACU  Anesthesia Type:GA combined with regional for post-op pain  Level of Consciousness: awake, alert  and oriented  Airway & Oxygen Therapy: Patient Spontanous Breathing and Patient connected to nasal cannula oxygen  Post-op Assessment: Report given to PACU RN, Post -op Vital signs reviewed and stable and Patient moving all extremities X 4  Post vital signs: Reviewed and stable  Complications: No apparent anesthesia complications

## 2013-12-26 NOTE — Progress Notes (Signed)
On hold for a room

## 2013-12-27 ENCOUNTER — Encounter (HOSPITAL_COMMUNITY): Payer: Self-pay | Admitting: Orthopedic Surgery

## 2013-12-27 MED ORDER — METHOCARBAMOL 500 MG PO TABS: 500.0000 mg | ORAL_TABLET | Freq: Four times a day (QID) | ORAL | Status: DC | PRN

## 2013-12-27 MED ORDER — OXYCODONE HCL ER 10 MG PO T12A: 10.0000 mg | EXTENDED_RELEASE_TABLET | Freq: Two times a day (BID) | ORAL | Status: DC

## 2013-12-27 MED ORDER — ENOXAPARIN SODIUM 40 MG/0.4ML ~~LOC~~ SOLN: 40.0000 mg | SUBCUTANEOUS | Status: DC

## 2013-12-27 MED ORDER — OXYCODONE HCL 5 MG PO TABS: 5.0000 mg | ORAL_TABLET | ORAL | Status: DC | PRN

## 2013-12-27 NOTE — Progress Notes (Signed)
SPORTS MEDICINE AND JOINT REPLACEMENT  Alyssa Mulch, MD   Carlynn Spry, PA-C [place], [place], [place]  [id number]                             [phone]   PROGRESS NOTE  Subjective:  negative for Chest Pain  negative for Shortness of Breath  negative for Nausea/Vomiting   negative for Calf Pain  negative for Bowel Movement   Tolerating Diet: yes         Patient reports pain as 3 on 0-10 scale.    Objective: Vital signs in last 24 hours:   Patient Vitals for the past 24 hrs:  BP Temp Temp src Pulse Resp SpO2  12/27/13 0855 - - - - - 100 %  12/27/13 0527 131/68 mmHg 98.9 F (37.2 C) Oral 90 16 95 %  12/27/13 0400 - - - - 16 -  12/27/13 0045 138/72 mmHg 98.1 F (36.7 C) - 102 16 98 %  12/27/13 0000 - - - - 16 -  12/26/13 2010 140/71 mmHg 98 F (36.7 C) - 98 16 98 %  12/26/13 1338 134/73 mmHg 97.2 F (36.2 C) - 104 10 98 %  12/26/13 1323 135/76 mmHg - - 106 11 98 %  12/26/13 1308 140/76 mmHg - - 108 10 98 %  12/26/13 1253 143/76 mmHg - - 106 10 99 %  12/26/13 1238 137/70 mmHg - - 69 12 99 %  12/26/13 1223 136/72 mmHg - - 68 9 100 %    @flow {1959:LAST@   Intake/Output from previous day:   07/22 0701 - 07/23 0700 In: 1920 [P.O.:120; I.V.:1700] Out: 700 [Urine:650]   Intake/Output this shift:   07/23 0701 - 07/23 1900 In: 50 [P.O.:50] Out: -    Intake/Output     07/22 0701 - 07/23 0700 07/23 0701 - 07/24 0700   P.O. 120 50   I.V. (mL/kg) 1700 (32.6)    IV Piggyback 100    Total Intake(mL/kg) 1920 (36.8) 50 (1)   Urine (mL/kg/hr) 650 (0.5)    Blood 50 (0)    Total Output 700     Net +1220 +50        Urine Occurrence 5 x       LABORATORY DATA:  Recent Labs  12/26/13 1505  WBC 7.4  HGB 12.4  HCT 37.6  PLT 244    Recent Labs  12/26/13 1505  CREATININE 0.53   Lab Results  Component Value Date   INR 1.02 12/20/2013    Examination:  General appearance: alert, cooperative and no distress Extremities: Homans sign is negative, no  sign of DVT  Wound Exam: erythematous   Drainage:  None: wound tissue dry  Motor Exam: EHL and FHL Intact  Sensory Exam: Deep Peroneal normal   Assessment:    1 Day Post-Op  Procedure(s) (LRB): UNICOMPARTMENTAL KNEE (Left)  ADDITIONAL DIAGNOSIS:  Active Problems:   Osteoarthritis of left knee  Acute Blood Loss Anemia   Plan: Physical Therapy as ordered Weight Bearing as Tolerated (WBAT)  DVT Prophylaxis:  Lovenox  DISCHARGE PLAN: Home  DISCHARGE NEEDS: HHPT, CPM, Walker and 3-in-1 comode seat         Erron Wengert 12/27/2013, 12:17 PM

## 2013-12-27 NOTE — Progress Notes (Signed)
Physical Therapy Treatment Patient Details Name: Alyssa Nielsen MRN: [id number] DOB: [birth year] Today's Date: 12/27/2013    History of Present Illness Pt admitted for elective L Unicompartmental Knee.    PT Comments    Pt is very motivated, limited by a little nausea during amb, but felt fine once seated and doing exercises. Pt is progressing well with exercises and has good strength and ROM in RLE. Pt planning to D/C home this afternoon.   Follow Up Recommendations  Home health PT     Equipment Recommendations  None recommended by PT    Recommendations for Other Services       Precautions / Restrictions Precautions Precautions: Knee Restrictions Weight Bearing Restrictions: Yes LLE Weight Bearing: Weight bearing as tolerated    Mobility  Bed Mobility Overal bed mobility: Needs Assistance Bed Mobility: Supine to Sit     Supine to sit: Supervision     General bed mobility comments: Pt is becoming much more independent with getting in and out of bed. Assist needed for moving legs to EOB and preventing IR.   Transfers Overall transfer level: Needs assistance Equipment used: Rolling walker (2 wheeled) Transfers: Sit to/from Stand Sit to Stand: Supervision         General transfer comment: supervision for safety and to steady.   Ambulation/Gait Ambulation/Gait assistance: Supervision Ambulation Distance (Feet): 200 Feet Assistive device: Rolling walker (2 wheeled) Gait Pattern/deviations: Step-to pattern;Decreased stride length Gait velocity: decreased Gait velocity interpretation: Below normal speed for age/gender General Gait Details: Pt moving at good speed but still below normal. Progressing to walking more continuously without step-to pattern.   Stairs Stairs: Yes Stairs assistance: Supervision Stair Management: Backwards;Step to pattern;No rails Number of Stairs: 1 General stair comments: pt did very well with stair training. Felt very comfortable  with instructions.   Wheelchair Mobility    Modified Rankin (Stroke Patients Only)       Balance                                    Cognition Arousal/Alertness: Awake/alert Behavior During Therapy: WFL for tasks assessed/performed Overall Cognitive Status: Within Functional Limits for tasks assessed                      Exercises Total Joint Exercises Ankle Circles/Pumps: AROM;10 reps;Seated;Both Quad Sets: AROM;Seated;Left;10 reps Heel Slides: AAROM;Seated;10 reps;Left Hip ABduction/ADduction: AAROM;Seated;Left;10 reps Straight Leg Raises: AAROM;Seated;Left;10 reps Long Arc Quad: AAROM;Seated;Left;10 reps    General Comments        Pertinent Vitals/Pain no apparent distress. Pt repositioned in recliner for comfort in footsie roll.      Home Living                      Prior Function            PT Goals (current goals can now be found in the care plan section) Progress towards PT goals: Progressing toward goals    Frequency  7X/week    PT Plan Current plan remains appropriate    Co-evaluation             End of Session Equipment Utilized During Treatment: Gait belt Activity Tolerance: Patient tolerated treatment well Patient left: in chair;with call bell/phone within reach;with family/visitor present     Time: 0277-4128 PT Time Calculation (min): 35 min  Charges:  G Codes:      Alyssa Nielsen,Alyssa Nielsen,SPTA 12/27/2013, 9:08 AM

## 2013-12-27 NOTE — Evaluation (Signed)
Occupational Therapy Evaluation and Discharge Patient Details Name: Alyssa Nielsen MRN: [id number] DOB: Oct 11, 1945 Today's Date: 12/27/2013    History of Present Illness Pt admitted for elective L Unicompartmental Knee.   Clinical Impression   Pt is a 68 yo female admitted for above who pta was independent and now presents with generalized weakness, acute pain and limited ROM interfering with her independence with ADLs. Educated pt on LB dressing/bathing sequence and technique, proper walk-in shower technique and new ways to use the AE she already has at home. Pt will have 24/7 supervision to assist with ADL/IADLs as needed at home, and pt feels confident in self care tasks. No further OT is needed, we will sign off.   Follow Up Recommendations  No OT follow up;Supervision/Assistance - 24 hour          Precautions / Restrictions Precautions Precautions: Knee Precaution Comments: Reviewed precautions with pt Restrictions LLE Weight Bearing: Weight bearing as tolerated      Mobility Bed Mobility Overal bed mobility: Needs Assistance Bed Mobility: Sit to Supine     Supine to sit: Supervision Sit to supine: Supervision   General bed mobility comments: for safety  Transfers Overall transfer level: Needs assistance Equipment used: Rolling walker (2 wheeled) Transfers: Sit to/from Stand Sit to Stand: Supervision         General transfer comment: supervision for safety and to steady.     Balance Overall balance assessment: Needs assistance Sitting-balance support: Feet supported;No upper extremity supported Sitting balance-Leahy Scale: Good     Standing balance support: No upper extremity supported Standing balance-Leahy Scale: Fair                              ADL Overall ADL's : Needs assistance/impaired Eating/Feeding: Independent;Sitting   Grooming: Wash/dry hands;Oral care;Standing;Supervision/safety   Upper Body Bathing: Set up;Sitting   Lower  Body Bathing: With adaptive equipment;Sit to/from stand;Minimal assistance   Upper Body Dressing : Set up;Sitting   Lower Body Dressing: With adaptive equipment;Sit to/from stand;Minimal assistance   Toilet Transfer: Ambulation;RW;BSC;Supervision/safety   Toileting- Clothing Manipulation and Hygiene: Sit to/from stand;Supervision/safety       Functional mobility during ADLs: Rolling walker;Supervision/safety General ADL Comments: Educated pt on LB dressing/bathing sequence and technique, using reacher for pants/panties and long handled sponge to assist with LB ADLs. Educated pt on proper walk-in shower technique and pt verbalized understanding. Pt stated that she has back pain which limits her from bending to reach her feet, but stated she will attempt to bring her leg up to her to get her socks and shoes on.               Pertinent Vitals/Pain Pt c/o pain during tx as 6/10, that had been constant since surgery. Pt felt nauseous during tx and was repositioned back in bed.        Extremity/Trunk Assessment Upper Extremity Assessment Upper Extremity Assessment: Overall WFL for tasks assessed   Lower Extremity Assessment Lower Extremity Assessment: Defer to PT evaluation   Cervical / Trunk Assessment Cervical / Trunk Assessment: Normal   Communication Communication Communication: No difficulties   Cognition Arousal/Alertness: Awake/alert Behavior During Therapy: WFL for tasks assessed/performed Overall Cognitive Status: Within Functional Limits for tasks assessed                       Home Living Family/patient expects to be discharged to:: Private residence Living Arrangements: Other  relatives (sister) Available Help at Discharge: Family Type of Home: House Home Access: Stairs to enter Technical brewer of Steps: 1 Entrance Stairs-Rails: None Home Layout: One level     Bathroom Shower/Tub: Walk-in Hydrologist: Standard      Home Equipment: Environmental consultant - 2 wheels;Bedside commode;Adaptive equipment;Shower seat - built in [employer]: Reacher;Long-handled sponge        Prior Functioning/Environment Level of Independence: Independent        Comments: Trouble getting socks and shoes on but able to do it with increased time             OT Goals(Current goals can be found in the care plan section) Acute Rehab OT Goals Patient Stated Goal: back independent and active   End of Session Equipment Utilized During Treatment: Rolling walker CPM Left Knee CPM Left Knee: On Left Knee Flexion (Degrees): 65 Left Knee Extension (Degrees): 0  Activity Tolerance: Patient tolerated treatment well Patient left: in bed;in CPM;with call bell/phone within reach   Time:  -    Charges:    G-CodesLyda Perone 01-03-2014, 12:11 PM

## 2013-12-27 NOTE — Plan of Care (Signed)
Problem: Consults Goal: Diagnosis- Total Joint Replacement Outcome: Completed/Met Date Met:  12/27/13 Partial/Uniknee

## 2013-12-27 NOTE — Discharge Instructions (Signed)
Diet: As you were doing prior to hospitalization   Activity:  Increase activity slowly as tolerated                  No lifting or driving for 6 weeks  Shower:  May shower without a dressing once there is no drainage from your wound.                 Do NOT wash over the wound.                 Dressing:  You may change your dressing on Friday                    Then change the dressing daily with sterile 4"x4"s gauze dressing                     And TED hose for knees.  Weight Bearing:  Weight bearing as tolerated as taught in physical therapy.  Use a                                walker or Crutches as instructed.  To prevent constipation: you may use a stool softener such as -               Colace ( over the counter) 100 mg by mouth twice a day                Drink plenty of fluids ( prune juice may be helpful) and high fiber foods                Miralax ( over the counter) for constipation as needed.    Precautions:  If you experience chest pain or shortness of breath - call 911 immediately               For transfer to the hospital emergency department!!               If you develop a fever greater that 101 F, purulent drainage from wound,                             increased redness or drainage from wound, or calf pain -- Call the office.  Follow- Up Appointment:  Please call for an appointment to be seen on 01/01/14 at [place] office:  [phone]            [address] [place], [place] [address]

## 2013-12-27 NOTE — Evaluation (Signed)
I have read and agree with this note.   Time in/out: [phone] Total time:39 minutes Valarie Merino, [place])  [address], OTR/L [phone]

## 2013-12-27 NOTE — Progress Notes (Signed)
Physical Therapy Treatment Patient Details Name: TASMIA BLUMER MRN: [id number] DOB: February 03, 1946 Today's Date: 12/27/2013    History of Present Illness Pt admitted for elective L Unicompartmental Knee.    PT Comments    Pt is very motivated and did not have any limitations this session. Pt feels very comfortable with going up her one stair at home. Educated pt on importance of zero knee. Pt planning to discharge home this afternoon.  Follow Up Recommendations  Home health PT     Equipment Recommendations  None recommended by PT    Recommendations for Other Services       Precautions / Restrictions Precautions Precautions: Knee Precaution Comments: Reviewed precautions with pt Restrictions LLE Weight Bearing: Weight bearing as tolerated    Mobility  Bed Mobility Overal bed mobility: Modified Independent Bed Mobility: Sit to Supine       Sit to supine: Supervision   General bed mobility comments: for safety  Transfers Overall transfer level: Needs assistance Equipment used: Rolling walker (2 wheeled) Transfers: Sit to/from Stand Sit to Stand: Min guard         General transfer comment: min guard for safety when sitting to control descent  Ambulation/Gait Ambulation/Gait assistance: Supervision Ambulation Distance (Feet): 200 Feet Assistive device: Rolling walker (2 wheeled) Gait Pattern/deviations: Step-to pattern;Decreased stride length Gait velocity: increasing  Gait velocity interpretation: Below normal speed for age/gender General Gait Details: Pt moving at increasing speed this session. Progressing to step-through and working on heel strike during gait.    Stairs Stairs: Yes Stairs assistance: Supervision Stair Management: No rails;Step to pattern;Backwards Number of Stairs: 1 General stair comments: Pt was able to remember instructions for stairs very well and felt comfortable with doing them on her own.   Wheelchair Mobility    Modified  Rankin (Stroke Patients Only)       Balance Overall balance assessment: Needs assistance Sitting-balance support: Feet supported;No upper extremity supported Sitting balance-Leahy Scale: Good     Standing balance support: No upper extremity supported Standing balance-Leahy Scale: Fair                      Cognition Arousal/Alertness: Awake/alert Behavior During Therapy: WFL for tasks assessed/performed Overall Cognitive Status: Within Functional Limits for tasks assessed                      Exercises      General Comments        Pertinent Vitals/Pain no apparent distress. Pt repositioned in recliner for comfort in footsie roll.      Home Living                      Prior Function Level of Independence: Independent      Comments: Trouble getting socks and shoes on but able to do it with increased time   PT Goals (current goals can now be found in the care plan section) Acute Rehab PT Goals Patient Stated Goal: back independent and active Progress towards PT goals: Progressing toward goals    Frequency  7X/week    PT Plan Current plan remains appropriate    Co-evaluation             End of Session Equipment Utilized During Treatment: Gait belt Activity Tolerance: Patient tolerated treatment well Patient left: in chair;with call bell/phone within reach;with family/visitor present     Time: 0301-0313 PT Time Calculation (min): 12 min  Charges:  G Codes:      BRASFIELD,Ronnett Pullin,SPTA 12/27/2013, 3:21 PM

## 2013-12-27 NOTE — Progress Notes (Signed)
Seen and agreed 12/27/2013 Jacqualyn Posey PTA [phone] pager [phone] office

## 2013-12-27 NOTE — Care Management Note (Signed)
CARE MANAGEMENT NOTE 12/27/2013  Patient:  Alyssa Nielsen, Alyssa Nielsen   Account Number:  [id number]  Date Initiated:  12/27/2013  Documentation initiated by:  Ricki Miller  Subjective/Objective Assessment:   68 yr old female s/p left unicompartmental knee.     Action/Plan:   Case manager spoke with patient concerning home health and DME needs. Patient states she will be going to [facility]. DME has been delivered.   Anticipated DC Date:  12/27/2013   Anticipated DC Plan:  HOME/SELF CARE      DC Planning Services  CM consult      PAC Choice  DURABLE MEDICAL EQUIPMENT   Choice offered to / List presented to:  C-1 Patient   DME arranged  CPM  [facility]  3-N-1      DME agency  [employer]     [place] arranged  HH-2 PT      [place] agency  NA   Status of service:  Completed, signed off Medicare Important Message given?  NA - LOS <3 / Initial given by admissions (If response is "NO", the following Medicare IM given date fields will be blank) Date Medicare IM given:  12/20/2013 Medicare IM given by:   Date Additional Medicare IM given:   Additional Medicare IM given by:    Discharge Disposition:  [place]  Per UR Regulation:  Reviewed for med. necessity/level of care/duration of stay  If discussed at [place] of Stay Meetings, dates discussed:    Comments:  12/27/13  Ricki Miller, RN BSN Care Manager IM given by admissions staff.

## 2013-12-27 NOTE — Progress Notes (Signed)
Seen and agreed 12/27/2013 Jacqualyn Posey PTA [phone] pager [phone] office

## 2014-01-01 DIAGNOSIS — Z9660 Presence of unspecified orthopedic joint implant: Secondary | ICD-10-CM | POA: Insufficient documentation

## 2014-01-01 HISTORY — DX: Presence of unspecified orthopedic joint implant: Z96.60

## 2014-01-03 NOTE — Op Note (Signed)
Partial KNEE REPLACEMENT OPERATIVE NOTE:  12/26/2013  9:15 AM  PATIENT:  Alyssa Nielsen  68 y.o. female  PRE-OPERATIVE DIAGNOSIS:  DJD  POST-OPERATIVE DIAGNOSIS:  DJD  PROCEDURE:  Procedure(s): UNICOMPARTMENTAL KNEE  SURGEON:  Surgeon(s): Vickey Huger, MD  PHYSICIAN ASSISTANT: Carlynn Spry, [facility]  ANESTHESIA:   general  DRAINS: Hemovac  SPECIMEN: None  COUNTS:  Correct  TOURNIQUET:   Total Tourniquet Time Documented: Thigh (Left) - 56 minutes Total: Thigh (Left) - 56 minutes   DICTATION:  Indication for procedure:    The patient is a 68 y.o. female who has failed conservative treatment for DJD.  Informed consent was obtained prior to anesthesia. The risks versus benefits of the operation were explain and in a way the patient can, and did, understand.   On the implant demand matching protocol, this patient scored 8.  Therefore, this patient was not receive a polyethylene insert with vitamin E which is a high demand implant.  Description of procedure:     The patient was taken to the operating room and placed under anesthesia.  The patient was positioned in the usual fashion taking care that all body parts were adequately padded and/or protected.  I foley catheter was not placed.  A tourniquet was applied and the leg prepped and draped in the usual sterile fashion.  The extremity was exsanguinated with the esmarch and tourniquet inflated to 350 mmHg.  Pre-operative range of motion was normal.  The knee was in 5 degree of mild varus.  A midline incision approximately 4 inches long was made with a #10 blade.  A new blade was used to make a parapatellar arthrotomy going 1 cm into the quadriceps tendon, over the patella, and alongside the medial aspect of the patellar tendon.  A synovectomy was then performed with the #10 blade and forceps. I then elevated the deep MCL off the medial tibial flare.  The knee was put at 90 degrees and the patient specific cutting blocks were used  to make our proximal tibial cut and distal femoral cut.  The medial meniscus was removed at this point.  I then used the D cutting guide on the femur to drill for lugs and cut the chamfers.  Likewise, a #2 tibial baseplate was used to prepare the tibia.  I then trialed the D femur and 2 tibia. I trialed several poly inserts and a 36mm achieved good balance in flexion and extension.  I then irrigated copiously and then mixed the cement.  I injected exparel in the deep soft tissues at this point.  I then cemented the tibia first followed by the femur and removed excess cement and then inserted the polyethylene.  I placed the leg in extension and finished injected the rest of the exparel.  Once the cement was hard, the tourniquet was let down.  Hemostasis was obtained.  The arthrotomy was closed with figure-8 #1 vicryl sutures.  The deep soft tissues were closed with #0 vicryls and the subcuticular layer closed with a running #2-0 vicryl.  The skin was reapproximated and closed with skin staples.  The wound was dressed with xeroform, 4 x4's, 2 ABD sponges, a single layer of webril and a TED stocking.   The patient was then awakened, extubated, and taken to the recovery room in stable condition.  BLOOD LOSS:  300cc DRAINS: 1 hemovac, 1 pain catheter COMPLICATIONS:  None.  PLAN OF CARE: Admit for overnight observation  PATIENT DISPOSITION:  PACU - hemodynamically stable.  Delay start of Pharmacological VTE agent (>24hrs) due to surgical blood loss or risk of bleeding:  not applicable  Please fax a copy of this op note to my office at [phone] (please only include page 1 and 2 of the Case Information op note)

## 2014-01-03 NOTE — Discharge Summary (Signed)
SPORTS MEDICINE & JOINT REPLACEMENT   Lara Mulch, MD   Carlynn Spry, PA-C [address], [place], [place]  [id number]                             [phone]  PATIENT ID: CHELSEA PEDRETTI        MRN:  [id number]          DOB/AGE: [birth year] / 69 y.o.    DISCHARGE SUMMARY  ADMISSION DATE:    12/26/2013 DISCHARGE DATE:  12/27/2013  ADMISSION DIAGNOSIS: DJD    DISCHARGE DIAGNOSIS:  DJD    ADDITIONAL DIAGNOSIS: Active Problems:   Osteoarthritis of left knee  Past Medical History  Diagnosis Date  . Allergy   . GERD (gastroesophageal reflux disease)   . Ulcer     STOMACH  . Osteoporosis   . Asthma   . Anxiety   . Headache(784.0)   . Arthritis 2011 and 2013    spine and L knee  . Cancer     breast cancer    PROCEDURE: Procedure(s): UNICOMPARTMENTAL KNEE on 12/26/2013  CONSULTS:     HISTORY:  See H&P in chart  HOSPITAL COURSE:  SULY VUKELICH is a 68 y.o. admitted on 12/26/2013 and found to have a diagnosis of DJD.  After appropriate laboratory studies were obtained  they were taken to the operating room on 12/26/2013 and underwent Procedure(s): UNICOMPARTMENTAL KNEE.   They were given perioperative antibiotics:  Anti-infectives   Start     Dose/Rate Route Frequency Ordered Stop   12/26/13 1600  ceFAZolin (ANCEF) IVPB 1 g/50 mL premix     1 g 100 mL/hr over 30 Minutes Intravenous Every 6 hours 12/26/13 1406 12/26/13 2158   12/26/13 0600  ceFAZolin (ANCEF) IVPB 2 g/50 mL premix     2 g 100 mL/hr over 30 Minutes Intravenous On call to O.R. 12/25/13 1431 12/26/13 0840    .  Tolerated the procedure well.  Placed with a foley intraoperatively.  Given Ofirmev at induction and for 48 hours.    POD# 1: Vital signs were stable.  Patient denied Chest pain, shortness of breath, or calf pain.  Patient was started on Lovenox 30 mg subcutaneously twice daily at 8am.  Consults to PT, OT, and care management were made.  The patient was weight bearing as tolerated.  CPM was  placed on the operative leg 0-90 degrees for 6-8 hours a day.  Incentive spirometry was taught.  Dressing was changed.  Hemovac was discontinued.      POD #2, Continued  PT for ambulation and exercise program.  IV saline locked.  O2 discontinued.    The remainder of the hospital course was dedicated to ambulation and strengthening.   The patient was discharged 1 day post op in  Good condition.  Blood products given:none  DIAGNOSTIC STUDIES: Recent vital signs: No data found.      Recent laboratory studies: No results found for this basename: WBC, HGB, HCT, PLT,  in the last 168 hours No results found for this basename: NA, K, CL, CO2, BUN, CREATININE, GLUCOSE, CALCIUM,  in the last 168 hours Lab Results  Component Value Date   INR 1.02 12/20/2013     Recent Radiographic Studies :  Dg Chest 2 View  12/20/2013   CLINICAL DATA:  Pre-admit for left total knee arthroplasty  EXAM: CHEST  2 VIEW  COMPARISON:  10/03/2003  FINDINGS: There is no  focal parenchymal opacity, pleural effusion, or pneumothorax. The heart and mediastinal contours are unremarkable.  There is an S-shaped scoliosis of the thoracolumbar spine. There is mild thoracolumbar spondylosis. There are surgical clips in the right anterior chest wall.  IMPRESSION: No active cardiopulmonary disease.   Electronically Signed   By: Kathreen Devoid   On: 12/20/2013 13:54    DISCHARGE INSTRUCTIONS: Discharge Instructions   CPM    Complete by:  As directed   Continuous passive motion machine (CPM):      Use the CPM from 0 to 90 for 6-8 hours per day.      You may increase by 10 per day.  You may break it up into 2 or 3 sessions per day.      Use CPM for 2 weeks or until you are told to stop.     Call MD / Call 911    Complete by:  As directed   If you experience chest pain or shortness of breath, CALL 911 and be transported to the hospital emergency room.  If you develope a fever above 101 F, pus (white drainage) or increased drainage  or redness at the wound, or calf pain, call your surgeon's office.     Change dressing    Complete by:  As directed   Change dressing on Friday, then change the dressing daily with sterile 4 x 4 inch gauze dressing and apply TED hose.     Constipation Prevention    Complete by:  As directed   Drink plenty of fluids.  Prune juice may be helpful.  You may use a stool softener, such as Colace (over the counter) 100 mg twice a day.  Use MiraLax (over the counter) for constipation as needed.     Diet - low sodium heart healthy    Complete by:  As directed      Do not put a pillow under the knee. Place it under the heel.    Complete by:  As directed      Driving restrictions    Complete by:  As directed   No driving for 6 weeks     Increase activity slowly as tolerated    Complete by:  As directed      Lifting restrictions    Complete by:  As directed   No lifting for 6 weeks           DISCHARGE MEDICATIONS:     Medication List         acetaminophen 500 MG tablet  Commonly known as:  TYLENOL  Take 500 mg by mouth every 8 (eight) hours as needed for moderate pain.     ALAWAY OP  Place 1 drop into both eyes daily as needed (for allergies / itching).     alendronate 70 MG tablet  Commonly known as:  FOSAMAX  Take 70 mg by mouth once a week. On Sunday   -   Take with a full glass of water on an empty stomach.     aluminum & magnesium hydroxide-simethicone 500-450-40 MG/5ML suspension  Commonly known as:  MYLANTA  Take by mouth every 6 (six) hours as needed for indigestion.     CITRACAL + D PO  Take 1 tablet by mouth daily.     enoxaparin 40 MG/0.4ML injection  Commonly known as:  LOVENOX  Inject 0.4 mLs (40 mg total) into the skin daily.     fluticasone 50 MCG/ACT nasal spray  Commonly known as:  FLONASE  Place 1 spray into both nostrils daily.     Fluticasone-Salmeterol 250-50 MCG/DOSE Aepb  Commonly known as:  ADVAIR DISKUS  Inhale 1 puff into the lungs 2 (two) times  daily.     loratadine 10 MG tablet  Commonly known as:  CLARITIN  Take 10 mg by mouth daily.     methocarbamol 500 MG tablet  Commonly known as:  ROBAXIN  Take 1-2 tablets (500-1,000 mg total) by mouth every 6 (six) hours as needed for muscle spasms.     montelukast 10 MG tablet  Commonly known as:  SINGULAIR  Take 1 tablet (10 mg total) by mouth at bedtime.     multivitamin tablet  Take 1 tablet by mouth daily.     naproxen sodium 220 MG tablet  Commonly known as:  ANAPROX  Take 220 mg by mouth daily as needed (for pain).     oxyCODONE 5 MG immediate release tablet  Commonly known as:  Oxy IR/ROXICODONE  Take 1-2 tablets (5-10 mg total) by mouth every 3 (three) hours as needed for breakthrough pain.     OxyCODONE 10 mg T12a 12 hr tablet  Commonly known as:  OXYCONTIN  Take 1 tablet (10 mg total) by mouth every 12 (twelve) hours.     SYSTANE OP  Place 1 drop into both eyes daily as needed (for dry eyes).        FOLLOW UP VISIT:       Follow-up Information   Follow up with Rudean Haskell, MD On 01/01/2014.   Specialty:  Orthopedic Surgery   Contact information:   [address] [place] [address] [phone]       DISPOSITION: HOME  CONDITION:  Good   Graves Nipp 01/03/2014, 3:27 PM

## 2014-01-30 ENCOUNTER — Ambulatory Visit: Payer: Medicare Other | Admitting: Family Medicine

## 2014-02-01 ENCOUNTER — Other Ambulatory Visit: Payer: Self-pay | Admitting: Family Medicine

## 2014-03-03 ENCOUNTER — Other Ambulatory Visit: Payer: Self-pay | Admitting: Family Medicine

## 2014-03-14 ENCOUNTER — Encounter: Payer: Medicare Other | Admitting: Family Medicine

## 2014-03-16 ENCOUNTER — Other Ambulatory Visit: Payer: Self-pay | Admitting: Family Medicine

## 2014-03-27 ENCOUNTER — Other Ambulatory Visit: Payer: Self-pay

## 2014-03-27 DIAGNOSIS — Z1231 Encounter for screening mammogram for malignant neoplasm of breast: Secondary | ICD-10-CM

## 2014-03-29 ENCOUNTER — Other Ambulatory Visit: Payer: Self-pay | Admitting: Family Medicine

## 2014-04-11 ENCOUNTER — Encounter: Payer: Self-pay | Admitting: Family Medicine

## 2014-04-11 ENCOUNTER — Ambulatory Visit (INDEPENDENT_AMBULATORY_CARE_PROVIDER_SITE_OTHER): Payer: Medicare Other | Admitting: Family Medicine

## 2014-04-11 VITALS — BP 120/68 | HR 94 | Temp 98.0°F | Resp 16 | Ht 59.25 in | Wt 114.6 lb

## 2014-04-11 DIAGNOSIS — M810 Age-related osteoporosis without current pathological fracture: Secondary | ICD-10-CM

## 2014-04-11 DIAGNOSIS — Z0000 Encounter for general adult medical examination without abnormal findings: Secondary | ICD-10-CM

## 2014-04-11 DIAGNOSIS — R739 Hyperglycemia, unspecified: Secondary | ICD-10-CM

## 2014-04-11 DIAGNOSIS — Z23 Encounter for immunization: Secondary | ICD-10-CM

## 2014-04-11 LAB — COMPREHENSIVE METABOLIC PANEL
ALK PHOS: 78 U/L (ref 39–117)
ALT: 16 U/L (ref 0–35)
AST: 20 U/L (ref 0–37)
Albumin: 3.8 g/dL (ref 3.5–5.2)
BUN: 9 mg/dL (ref 6–23)
CHLORIDE: 104 meq/L (ref 96–112)
CO2: 27 mEq/L (ref 19–32)
Calcium: 9.7 mg/dL (ref 8.4–10.5)
Creat: 0.61 mg/dL (ref 0.50–1.10)
Glucose, Bld: 92 mg/dL (ref 70–99)
POTASSIUM: 4.6 meq/L (ref 3.5–5.3)
SODIUM: 141 meq/L (ref 135–145)
TOTAL PROTEIN: 6.2 g/dL (ref 6.0–8.3)
Total Bilirubin: 0.8 mg/dL (ref 0.2–1.2)

## 2014-04-11 MED ORDER — ALENDRONATE SODIUM 70 MG PO TABS: ORAL_TABLET | ORAL | Status: DC

## 2014-04-11 NOTE — Patient Instructions (Signed)
Your physical exam looked great. We've referred you for a bone density scan. We'll let you know the results of your labs. Please return in one year for another physical, sooner as you need!

## 2014-04-11 NOTE — Progress Notes (Signed)
Subjective:    Patient ID: Alyssa Nielsen, female    DOB: 1945/06/12, 68 y.o.   MRN: [id number]  Alyssa Lennox, MD  Chief Complaint  Patient presents with  . Annual Exam    with REFILL-FOSAMAX   Patient Active Problem List   Diagnosis Date Noted  . Osteoarthritis of left knee 12/26/2013  . UNSPECIFIED ASTHMA 03/14/2013  . OSTEOPOROSIS 06/22/2011  . Psoriasis 06/22/2011  . Lower back pain 06/22/2011  . Vitamin d deficiency 06/22/2011  . DEPRESSION 06/30/2007  . HYPERTENSION 06/30/2007  . ARTHRITIS 06/30/2007  . ALLERGIC RHINITIS 01/19/2007  . GERD 01/19/2007  . BREAST CANCER, HX OF 01/19/2007  . ESOPHAGEAL STRICTURE 08/24/2004  . DIVERTICULOSIS OF COLON 07/28/2004   Prior to Admission medications   Medication Sig Start Date End Date Taking? Authorizing Provider  acetaminophen (TYLENOL) 500 MG tablet Take 500 mg by mouth every 8 (eight) hours as needed for moderate pain.   Yes Historical Provider, MD  ADVAIR DISKUS 250-50 MCG/DOSE AEPB INHALE 1 DOSE BY MOUTH TWICE DAILY. RINSE MOUTH AFTER USE 03/16/14  Yes Mancel Bale, PA-C  alendronate (FOSAMAX) 70 MG tablet TAKE 1 TABLET (70 MG TOTAL) BY MOUTH ONCE A WEEK 03/29/14  Yes Mancel Bale, PA-C  aluminum & magnesium hydroxide-simethicone (MYLANTA) 500-450-40 MG/5ML suspension Take by mouth every 6 (six) hours as needed for indigestion.   Yes Historical Provider, MD  Calcium Citrate-Vitamin D (CITRACAL + D PO) Take 1 tablet by mouth daily.    Yes Historical Provider, MD  fluticasone (FLONASE) 50 MCG/ACT nasal spray Place 1 spray into both nostrils daily.   Yes Historical Provider, MD  Ketotifen Fumarate (ALAWAY OP) Place 1 drop into both eyes daily as needed (for allergies / itching).   Yes Historical Provider, MD  loratadine (CLARITIN) 10 MG tablet Take 10 mg by mouth daily.   Yes Historical Provider, MD  montelukast (SINGULAIR) 10 MG tablet TAKE 1 TABLET (10 MG TOTAL) BY MOUTH AT BEDTIME. 03/16/14  Yes Mancel Bale, PA-C    Multiple Vitamin (MULTIVITAMIN) tablet Take 1 tablet by mouth daily.   Yes Historical Provider, MD  naproxen sodium (ANAPROX) 220 MG tablet Take 220 mg by mouth daily as needed (for pain).   Yes Historical Provider, MD  Polyethyl Glycol-Propyl Glycol (SYSTANE OP) Place 1 drop into both eyes daily as needed (for dry eyes).   Yes Historical Provider, MD   No chest pain, SOB, HA, dizziness, vision change, N/V, diarrhea, constipation, dysuria, urinary urgency or frequency, myalgias, arthralgias or rash.  HPI  68 yof here for CPE.   Has been doing well since last seen here.  She had partial left knee replacement in July 2015 through [facility]. She is very happy with the results. Left knee still occasionally hurts but is much improved. She is doing her exercises for her knee every day.   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 Saw ortho several yrs ago and did some PT initially, she now does shoulder stretching daily and strengthening 3x/week. She feels her low back is better now and thinks this is because her knee is better and she's walking better. She has not had any more tingling down right leg. She continues to take aleve and tylenol prn for pain.   Otherwise she has no new issues or complaints.   Health maintenance: She is due for colonoscopy next year. States she received letter from GI that previously did her colonoscopy and will contact them to get that scheduled  soon. Received flu vaccine here. She is due for prevnar today but received flu vaccine so will defer until next visit. Due for bmd screen.   She mentions occasional chest discomfort after meals that relieves with mylanta. This is unchanged for her for many yrs. Never has any exertional CP, SOB, palps, syncope, or presyncope. CP after meals never radiates.   Review of Systems No fevers, chills, N/V, dysuria, diarrhea, constipation.     Objective:   Physical Exam  Constitutional: She is oriented to person, place, and time.  She appears well-developed and well-nourished.  BP 120/68 mmHg  Pulse 94  Temp(Src) 98 F (36.7 C) (Oral)  Resp 16  Ht 4' 11.25" (1.505 m)  Wt 114 lb 9.6 oz (51.982 kg)  BMI 22.95 kg/m2  SpO2 98%   HENT:  Head: Normocephalic and atraumatic.  Mouth/Throat: Uvula is midline, oropharynx is clear and moist and mucous membranes are normal. No oropharyngeal exudate, posterior oropharyngeal edema or posterior oropharyngeal erythema.  Eyes: Conjunctivae and EOM are normal. Pupils are equal, round, and reactive to light. No scleral icterus.  Neck: Trachea normal. Normal carotid pulses present. Carotid bruit is not present. No thyroid mass and no thyromegaly present.  Cardiovascular: Normal rate, regular rhythm, S1 normal, S2 normal and normal heart sounds.  Exam reveals no gallop.   No murmur heard. Pulses:      Dorsalis pedis pulses are 2+ on the right side, and 2+ on the left side.       Posterior tibial pulses are 2+ on the right side, and 2+ on the left side.  Pulmonary/Chest: Effort normal and breath sounds normal. She has no decreased breath sounds. She has no wheezes. She has no rhonchi. She has no rales.  Abdominal: Soft. Normal appearance and bowel sounds are normal. There is no tenderness.  Musculoskeletal:       Right shoulder: She exhibits decreased range of motion and tenderness. She exhibits no effusion, no crepitus and normal strength.       Left shoulder: She exhibits decreased range of motion and tenderness. She exhibits no effusion, no crepitus and normal strength.       Right knee: She exhibits normal range of motion. No tenderness found.       Left knee: She exhibits normal range of motion. No tenderness found.       Right hand: She exhibits swelling. She exhibits no bony tenderness and normal capillary refill. Normal sensation noted. Normal strength noted.       Left hand: She exhibits swelling. She exhibits no bony tenderness and normal capillary refill. Normal sensation  noted. Normal strength noted.  Mod swelling PIP and DIP joints bilaterally.   Neurological: She is alert and oriented to person, place, and time. She has normal strength. No cranial nerve deficit or sensory deficit. Coordination normal.  Neg finger to nose. Neg rapid alternating movements.   Psychiatric: She has a normal mood and affect. Her speech is normal.      Assessment & Plan:   61 yof here for CPE.   Routine general medical examination at a health care facility Need for prophylactic vaccination and inoculation against influenza - Plan: Flu Vaccine QUAD 36+ mos IM --Doing well, nothing concerning with hx or PE --RTC one year --prevnar due, will wait till next visit  Hyperglycemia - Plan: Comprehensive metabolic panel  Osteoporosis - Plan: alendronate (FOSAMAX) 70 MG tablet, DG Bone Density --Refilled fosamax --Referred for dexa, pt interested in coming off fosamax if  possible.   Julieta Gutting, PA-C Physician Assistant-Certified [facility]  04/11/2014 11:59 AM

## 2014-04-11 NOTE — Progress Notes (Signed)
This pleasant 68 y.o. Cauc female is here for [facility] Subsequent Annual exam. She is doing well and medical problems are stable. She is seen today by Araceli Bouche, PA-C. I have reviewed and agree with his documentation, assessment and plan of care. Nothing to add.  Barton Fanny, MD [facility]

## 2014-04-14 ENCOUNTER — Encounter: Payer: Self-pay | Admitting: *Deleted

## 2014-04-17 ENCOUNTER — Ambulatory Visit
Admission: RE | Admit: 2014-04-17 | Discharge: 2014-04-17 | Disposition: A | Discharge status: Home / Self Care | Payer: Medicare Other | Source: Ambulatory Visit

## 2014-04-17 ENCOUNTER — Encounter (INDEPENDENT_AMBULATORY_CARE_PROVIDER_SITE_OTHER): Payer: Self-pay

## 2014-04-17 DIAGNOSIS — Z1231 Encounter for screening mammogram for malignant neoplasm of breast: Secondary | ICD-10-CM

## 2014-04-23 ENCOUNTER — Other Ambulatory Visit: Payer: Self-pay | Admitting: Physician Assistant

## 2014-05-10 ENCOUNTER — Encounter: Payer: Self-pay | Admitting: Family Medicine

## 2014-05-10 ENCOUNTER — Ambulatory Visit (INDEPENDENT_AMBULATORY_CARE_PROVIDER_SITE_OTHER): Payer: Medicare Other | Admitting: Family Medicine

## 2014-05-10 VITALS — BP 136/76 | HR 74 | Temp 98.4°F | Resp 16 | Ht 59.0 in | Wt 118.0 lb

## 2014-05-10 DIAGNOSIS — S20219A Contusion of unspecified front wall of thorax, initial encounter: Secondary | ICD-10-CM

## 2014-05-10 NOTE — Patient Instructions (Addendum)
You have bruised your ribs; this condition should get better over the next 7- 10 days. If you have a prescribed pain medication (from the [facility] surgery), you can take 1/2 tablet every 8-12 hours a needed for pain. Also, get an over the counter cream (Arnica Cream is a good choice). You can apply this cream 3-4 times a day to the sore areas.  If your symptoms increase, please return for re-evaluation. I do not think an xray is needed today.

## 2014-05-12 ENCOUNTER — Encounter: Payer: Self-pay | Admitting: Family Medicine

## 2014-05-12 NOTE — Progress Notes (Signed)
S:  This 68 y.o. Cauc female is here for eval of rib pain. She reports bruisng her rib cage while bending over the edge of a tub. As she pulled back to stand up, her lower ribs grazed the edge of the tub. She is sore and concerned that she may have injured her ribs or lungs. It is hard for her to use Advair Diskus properly due to rib pain. She has no skin bruising, chest pain, SOB or DOE, cough, nausea or weakness. She has some soreness in her lower ribs; naproxen provides temporary relief.   Patient Active Problem List   Diagnosis Date Noted  . Osteoarthritis of left knee 12/26/2013  . UNSPECIFIED ASTHMA 03/14/2013  . OSTEOPOROSIS 06/22/2011  . Psoriasis 06/22/2011  . Lower back pain 06/22/2011  . Vitamin d deficiency 06/22/2011  . DEPRESSION 06/30/2007  . HYPERTENSION 06/30/2007  . ARTHRITIS 06/30/2007  . ALLERGIC RHINITIS 01/19/2007  . GERD 01/19/2007  . BREAST CANCER, HX OF 01/19/2007  . ESOPHAGEAL STRICTURE 08/24/2004  . DIVERTICULOSIS OF COLON 07/28/2004    Prior to Admission medications   Medication Sig Start Date End Date Taking? Authorizing Provider  acetaminophen (TYLENOL) 500 MG tablet Take 500 mg by mouth every 8 (eight) hours as needed for moderate pain.   Yes Historical Provider, MD  ADVAIR DISKUS 250-50 MCG/DOSE AEPB INHALE 1 DOSE BY MOUTH TWICE DAILY. RINSE MOUTH AFTER USE 03/16/14  Yes Mancel Bale, PA-C  alendronate (FOSAMAX) 70 MG tablet TAKE 1 TABLET (70 MG TOTAL) BY MOUTH ONCE A WEEK 04/11/14  Yes Araceli Bouche, PA  aluminum & magnesium hydroxide-simethicone (MYLANTA) 500-450-40 MG/5ML suspension Take by mouth every 6 (six) hours as needed for indigestion.   Yes Historical Provider, MD  Calcium Citrate-Vitamin D (CITRACAL + D PO) Take 1 tablet by mouth daily.    Yes Historical Provider, MD  fluticasone (FLONASE) 50 MCG/ACT nasal spray Place 1 spray into both nostrils daily.   Yes Historical Provider, MD  Ketotifen Fumarate (ALAWAY OP) Place 1 drop into both eyes daily  as needed (for allergies / itching).   Yes Historical Provider, MD  loratadine (CLARITIN) 10 MG tablet Take 10 mg by mouth daily.   Yes Historical Provider, MD  montelukast (SINGULAIR) 10 MG tablet TAKE 1 TABLET (10 MG TOTAL) BY MOUTH AT BEDTIME. 03/16/14  Yes Mancel Bale, PA-C  Multiple Vitamin (MULTIVITAMIN) tablet Take 1 tablet by mouth daily.   Yes Historical Provider, MD  naproxen sodium (ANAPROX) 220 MG tablet Take 220 mg by mouth daily as needed (for pain).   Yes Historical Provider, MD  Polyethyl Glycol-Propyl Glycol (SYSTANE OP) Place 1 drop into both eyes daily as needed (for dry eyes).   Yes Historical Provider, MD    ROS: As per HPI.   O: Filed Vitals:   05/10/14 1137  BP: 136/76  Pulse: 74  Temp: 98.4 F (36.9 C)  Resp: 16   GEN: In NAD; WN, WD. HENT: [place]/AT; EOMI w/ clear conj/sclerae. Otherwise unremarkable. LUNGS: CTA; no rales or wheezes. MS: Thorax- ribcage tender to moderate palpation along axillary and lower anterior margins. No palpable crepitus. SKIN: No visible abrasions, bruising, petechiae or ecchymoses along injury site. NEURO: A&O x 3; CNs intact. Nonfocal.  A/P: Rib contusion, unspecified laterality, initial encounter- Reassurance; no xray warranted today. Continue symptomatic treatment and return to clinic if symptoms worsen.

## 2015-01-26 ENCOUNTER — Other Ambulatory Visit: Payer: Self-pay | Admitting: Physician Assistant

## 2015-02-22 ENCOUNTER — Other Ambulatory Visit: Payer: Self-pay | Admitting: Physician Assistant

## 2015-03-04 ENCOUNTER — Other Ambulatory Visit: Payer: Self-pay | Admitting: Physician Assistant

## 2015-03-25 ENCOUNTER — Other Ambulatory Visit: Payer: Self-pay

## 2015-03-25 DIAGNOSIS — Z1231 Encounter for screening mammogram for malignant neoplasm of breast: Secondary | ICD-10-CM

## 2015-04-01 ENCOUNTER — Other Ambulatory Visit: Payer: Self-pay | Admitting: Physician Assistant

## 2015-04-01 ENCOUNTER — Other Ambulatory Visit: Payer: Self-pay | Admitting: Urgent Care

## 2015-04-02 ENCOUNTER — Ambulatory Visit (INDEPENDENT_AMBULATORY_CARE_PROVIDER_SITE_OTHER): Payer: Medicare Other | Admitting: Family Medicine

## 2015-04-02 ENCOUNTER — Encounter: Payer: Self-pay | Admitting: Family Medicine

## 2015-04-02 VITALS — BP 128/78 | HR 71 | Temp 98.5°F | Resp 16 | Ht 60.0 in | Wt 121.0 lb

## 2015-04-02 DIAGNOSIS — J4520 Mild intermittent asthma, uncomplicated: Secondary | ICD-10-CM

## 2015-04-02 DIAGNOSIS — J302 Other seasonal allergic rhinitis: Secondary | ICD-10-CM | POA: Diagnosis not present

## 2015-04-02 DIAGNOSIS — Z23 Encounter for immunization: Secondary | ICD-10-CM | POA: Diagnosis not present

## 2015-04-02 MED ORDER — FLUTICASONE-SALMETEROL 250-50 MCG/DOSE IN AEPB: INHALATION_SPRAY | RESPIRATORY_TRACT | Status: DC

## 2015-04-02 MED ORDER — FLUTICASONE PROPIONATE 50 MCG/ACT NA SUSP: 1.0000 | Freq: Every day | NASAL | Status: DC

## 2015-04-02 NOTE — Progress Notes (Signed)
Subjective:    Patient ID: Alyssa Nielsen, female    DOB: April 01, 1946, 68 y.o.   MRN: [id number]  HPI This is a pleasant 69 yo female who presents today for follow up of asthma. She has been taking advair and needs a refill. She does not have a rescue inhaler. She denies wheezing or SOB. Energy level and mood are good.   She was seen 11/15 for CPE. Her annual mammogram is scheduled for next month. She has had zoster, pneumovax and annual flu vaccine and will have Prevnar 13 today. She is up to date on Tdap.   Past Medical History  Diagnosis Date  . Allergy   . GERD (gastroesophageal reflux disease)   . Ulcer     STOMACH  . Osteoporosis   . Asthma   . Anxiety   . Headache(784.0)   . Arthritis 2011 and 2013    spine and L knee  . Cancer [facility])     breast cancer   Past Surgical History  Procedure Laterality Date  . Breast surgery  1999  1996    R BREAST (CANCEROUS)  L BREAST (BENIGN)     . Tonsillectomy      AGE 14  . Mouth surgery      WISDOM TEETH REMOVAL  . Partial knee arthroplasty Left 12/26/2013    Procedure: UNICOMPARTMENTAL KNEE;  Surgeon: Vickey Huger, MD;  Location: [place];  Service: Orthopedics;  Laterality: Left;   Family History  Problem Relation Age of Onset  . Hypertension Mother   . Heart disease Mother   . Stroke Mother     HEMORRAGHIC  . Cancer Father 33    PANCREATIC  . Hypertension Father   . Hypertension Sister   . Thyroid disease Sister   . Stroke Sister   . Hypertension Brother    Social History  Substance Use Topics  . Smoking status: Former Smoker -- 2 years    Types: Cigarettes    Quit date: 06/07/1973  . Smokeless tobacco: None  . Alcohol Use: No   Review of Systems  Constitutional: Negative for fever, fatigue and unexpected weight change.  Respiratory: Negative for cough and shortness of breath.   Cardiovascular: Negative for chest pain and leg swelling.  Gastrointestinal: Negative for abdominal pain, diarrhea and constipation.    Genitourinary: Negative for dysuria, frequency, hematuria and difficulty urinating.  Musculoskeletal: Positive for myalgias (knee, takes tylenol with good relief).  Neurological: Negative for dizziness, light-headedness and headaches.      Objective:   Physical Exam Physical Exam  Constitutional: Oriented to person, place, and time. She appears well-developed and well-nourished.  HENT:  Head: Normocephalic and atraumatic.  Eyes: Conjunctivae are normal.  Neck: Normal range of motion. Neck supple.  Cardiovascular: Normal rate, regular rhythm and normal heart sounds.   Pulmonary/Chest: Effort normal and breath sounds normal.  Musculoskeletal: Normal range of motion.  Neurological: Alert and oriented to person, place, and time.  Skin: Skin is warm and dry.  Psychiatric: Normal mood and affect. Behavior is normal. Judgment and thought content normal.  Vitals reviewed. BP 128/78 mmHg  Pulse 71  Temp(Src) 98.5 F (36.9 C) (Oral)  Resp 16  Ht 5' (1.524 m)  Wt 121 lb (54.885 kg)  BMI 23.63 kg/m2 Wt Readings from Last 3 Encounters:  04/02/15 121 lb (54.885 kg)  05/10/14 118 lb (53.524 kg)  04/11/14 114 lb 9.6 oz (51.982 kg)      Assessment & Plan:  1. Other seasonal  allergic rhinitis - fluticasone (FLONASE) 50 MCG/ACT nasal spray; Place 1 spray into both nostrils daily.  Dispense: 16 g; Refill: 5  2. Asthma, mild intermittent, uncomplicated - Fluticasone-Salmeterol (ADVAIR DISKUS) 250-50 MCG/DOSE AEPB; INHALE 2 PUFFS EVERY DAY RINSE MOUTH AFTER USE  Dispense: 60 each; Refill: 11  3. Need for prophylactic vaccination against Streptococcus pneumoniae (pneumococcus) - Pneumococcal conjugate vaccine 13-valent IM  - follow up in 6 months  Clarene Reamer, FNP-BC  [facility]  04/04/2015 2:40 PM

## 2015-04-21 ENCOUNTER — Ambulatory Visit: Payer: Medicare Other | Admitting: Family Medicine

## 2015-04-22 ENCOUNTER — Ambulatory Visit
Admission: RE | Admit: 2015-04-22 | Discharge: 2015-04-22 | Disposition: A | Discharge status: Home / Self Care | Payer: Medicare Other | Source: Ambulatory Visit

## 2015-04-22 DIAGNOSIS — Z1231 Encounter for screening mammogram for malignant neoplasm of breast: Secondary | ICD-10-CM

## 2015-04-29 ENCOUNTER — Other Ambulatory Visit: Payer: Self-pay | Admitting: Physician Assistant

## 2015-04-30 NOTE — Telephone Encounter (Signed)
Alyssa Nielsen can we refill her Singular it wasn't mentioned on her last visit she has a follow up in April

## 2015-05-02 ENCOUNTER — Other Ambulatory Visit: Payer: Self-pay

## 2015-05-02 ENCOUNTER — Other Ambulatory Visit: Payer: Self-pay | Admitting: Physician Assistant

## 2015-05-02 DIAGNOSIS — M810 Age-related osteoporosis without current pathological fracture: Secondary | ICD-10-CM

## 2015-05-02 MED ORDER — ALENDRONATE SODIUM 70 MG PO TABS: ORAL_TABLET | ORAL | Status: DC

## 2015-05-02 NOTE — Telephone Encounter (Signed)
Debbie, you saw pt last month for check up and f/up is to be in 6 mos. OK to RF her alendronate?

## 2015-05-05 ENCOUNTER — Other Ambulatory Visit: Payer: Self-pay | Admitting: Family Medicine

## 2015-05-05 DIAGNOSIS — R928 Other abnormal and inconclusive findings on diagnostic imaging of breast: Secondary | ICD-10-CM

## 2015-05-12 ENCOUNTER — Ambulatory Visit
Admission: RE | Admit: 2015-05-12 | Discharge: 2015-05-12 | Disposition: A | Discharge status: Home / Self Care | Payer: Medicare Other | Source: Ambulatory Visit | Attending: Family Medicine | Admitting: Family Medicine

## 2015-05-12 DIAGNOSIS — R928 Other abnormal and inconclusive findings on diagnostic imaging of breast: Secondary | ICD-10-CM

## 2015-07-08 IMAGING — CR DG CHEST 2V
2 series · 2 of 2 positions shown · non-contrast
Comparison: 10/03/2003

CLINICAL DATA: Pre-admit for left total knee arthroplasty

EXAM:
CHEST  2 VIEW

[w chest pa]
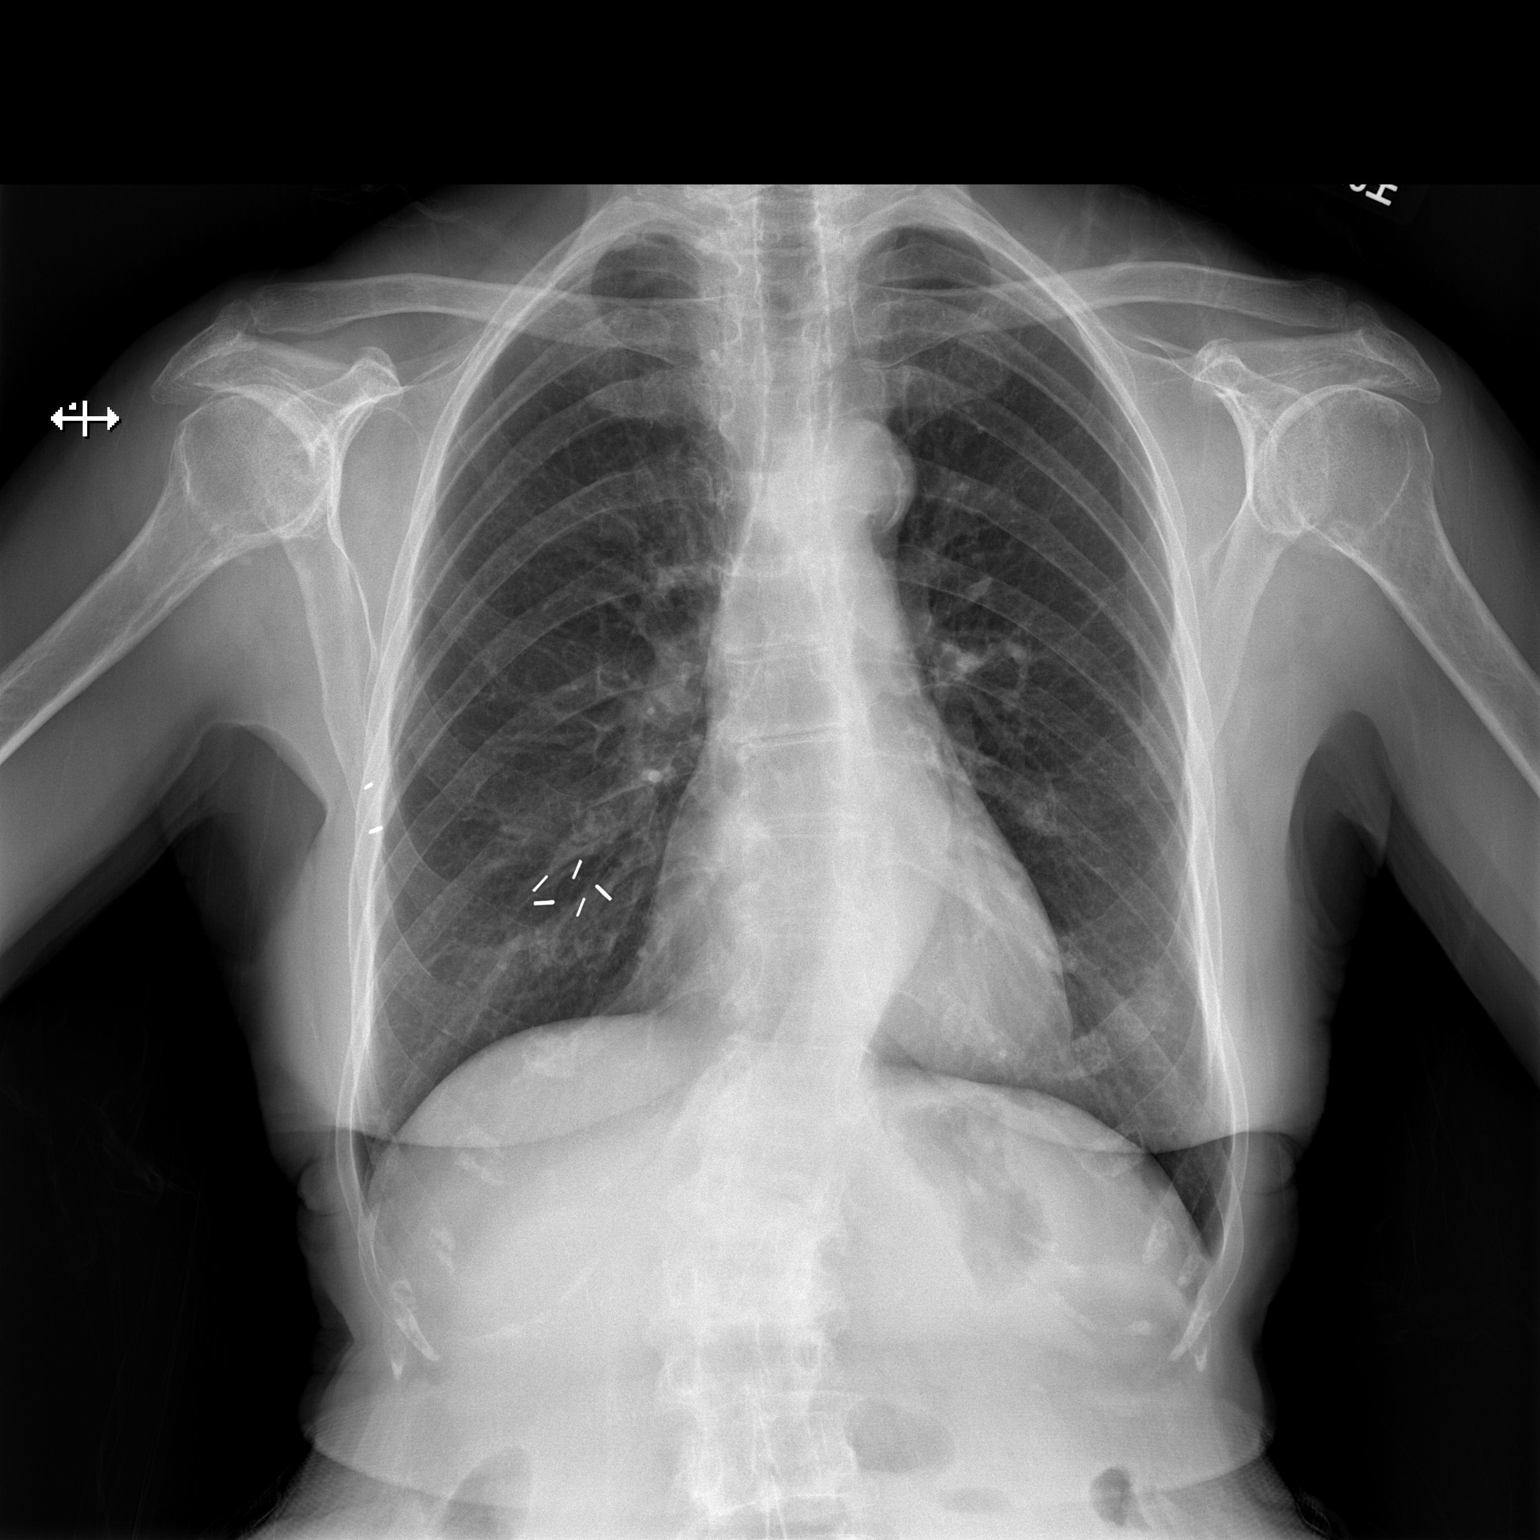

[w chest lat]
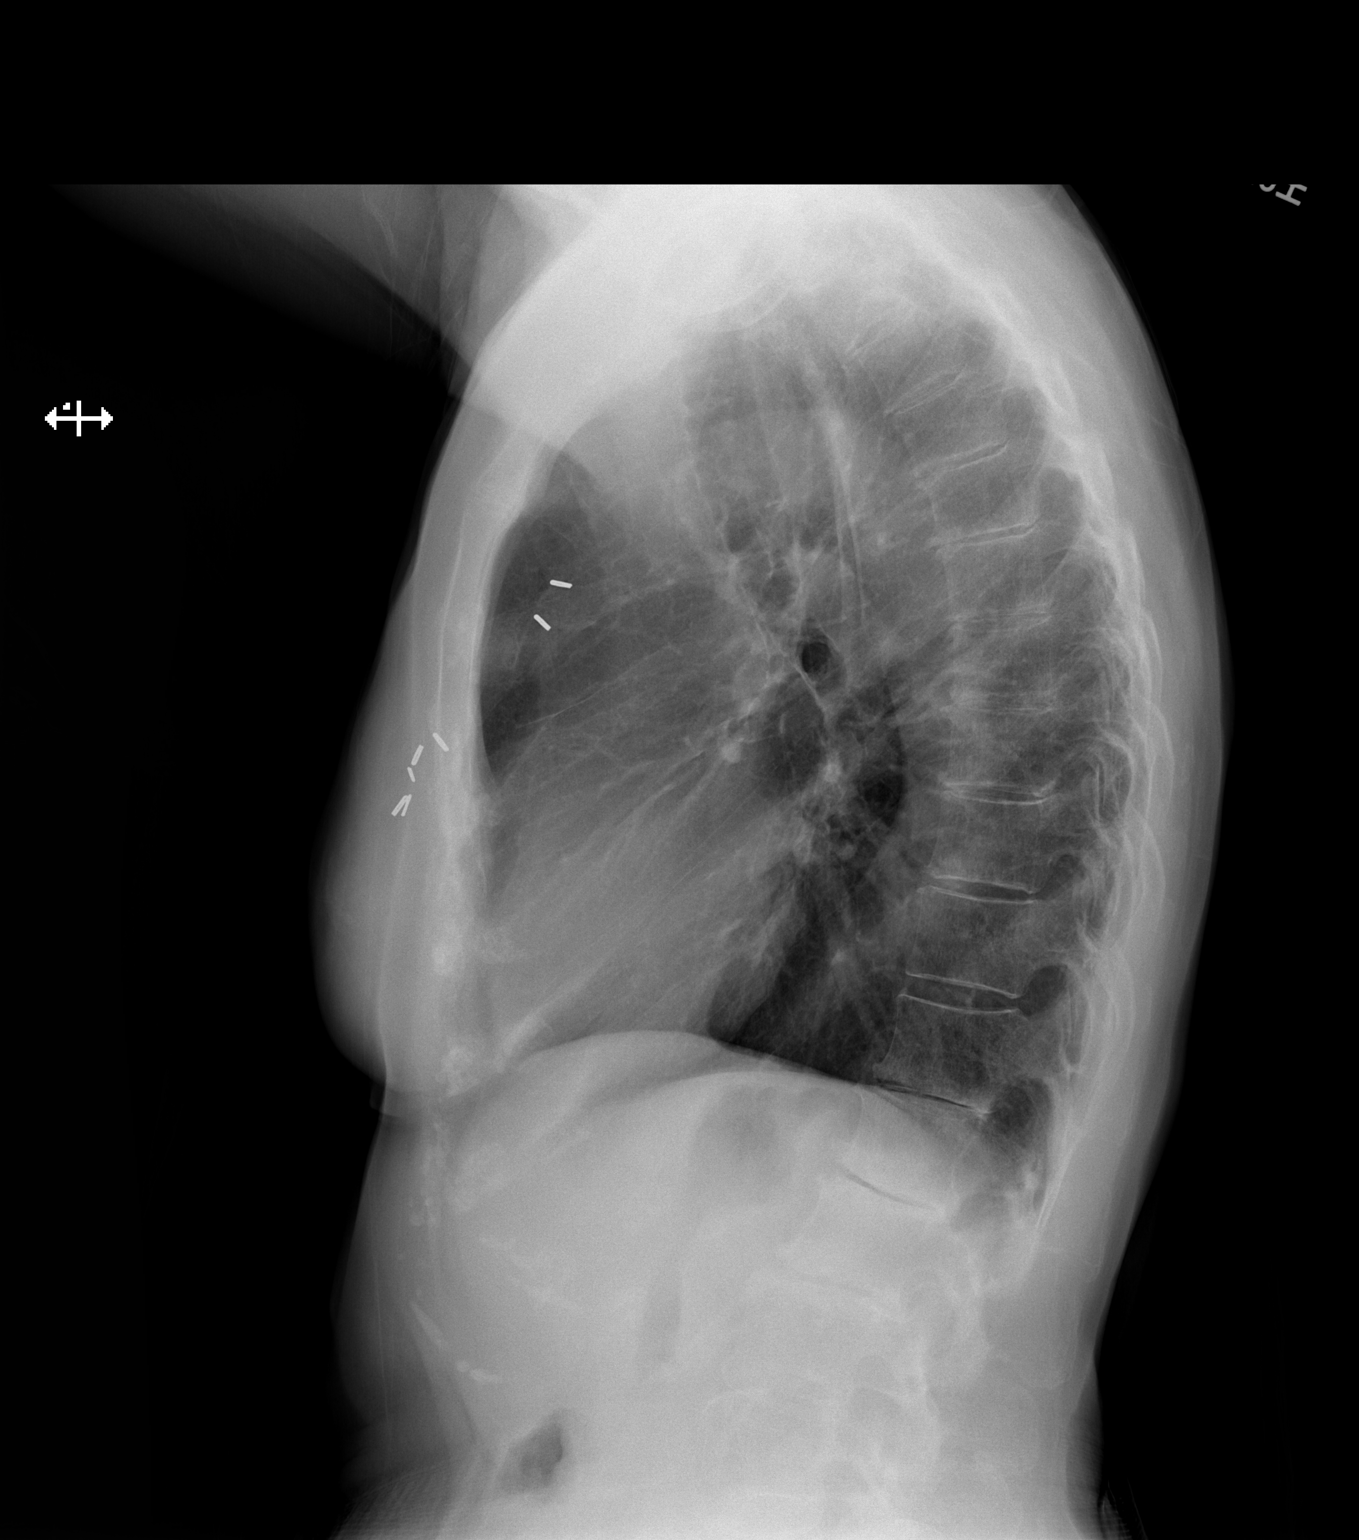

[2 of 2 positions shown; findings below may reference images not displayed]

FINDINGS: There is no focal parenchymal opacity, pleural effusion, or
pneumothorax. The heart and mediastinal contours are unremarkable.

There is an S-shaped scoliosis of the thoracolumbar spine. There is
mild thoracolumbar spondylosis. There are surgical clips in the
right anterior chest wall.
IMPRESSION: No active cardiopulmonary disease.

## 2015-07-29 ENCOUNTER — Other Ambulatory Visit: Payer: Self-pay

## 2015-07-29 MED ORDER — MONTELUKAST SODIUM 10 MG PO TABS: 10.0000 mg | ORAL_TABLET | Freq: Every day | ORAL | Status: DC

## 2015-09-30 ENCOUNTER — Encounter: Payer: Self-pay | Admitting: Family Medicine

## 2015-09-30 ENCOUNTER — Ambulatory Visit (INDEPENDENT_AMBULATORY_CARE_PROVIDER_SITE_OTHER): Payer: Medicare Other | Admitting: Family Medicine

## 2015-09-30 VITALS — BP 134/74 | HR 80 | Temp 99.2°F | Resp 16 | Ht 60.0 in | Wt 124.0 lb

## 2015-09-30 DIAGNOSIS — J4520 Mild intermittent asthma, uncomplicated: Secondary | ICD-10-CM | POA: Diagnosis not present

## 2015-09-30 DIAGNOSIS — M810 Age-related osteoporosis without current pathological fracture: Secondary | ICD-10-CM | POA: Diagnosis not present

## 2015-09-30 DIAGNOSIS — L723 Sebaceous cyst: Secondary | ICD-10-CM | POA: Diagnosis not present

## 2015-09-30 DIAGNOSIS — J302 Other seasonal allergic rhinitis: Secondary | ICD-10-CM

## 2015-09-30 NOTE — Progress Notes (Signed)
Subjective:    Patient ID: Alyssa Nielsen, female    DOB: 1946/02/16, 71 y.o.   MRN: [id number]  HPI This is a pleasant 70 yo female who presents today for follow up of asthma. Allergies have been bad but has continued to take signulair, flonase and inhaler. Breathing is good, no cough or wheeze.   Right side of neck with sebaceous cyst for about 7 years. Removal was recommended but she was unable to have it removed due to having other health related issues. The cyst is getting larger and is sometimes painful. She would like referral for removal.   She has been on Fosamax for many years and would like to stop taking due to potential side effects.   Past Medical History  Diagnosis Date  . Allergy   . GERD (gastroesophageal reflux disease)   . Ulcer     STOMACH  . Osteoporosis   . Asthma   . Anxiety   . Headache(784.0)   . Arthritis 2011 and 2013    spine and L knee  . Cancer [facility])     breast cancer   Past Surgical History  Procedure Laterality Date  . Breast surgery  1999  1996    R BREAST (CANCEROUS)  L BREAST (BENIGN)     . Tonsillectomy      AGE 70  . Mouth surgery      WISDOM TEETH REMOVAL  . Partial knee arthroplasty Left 12/26/2013    Procedure: UNICOMPARTMENTAL KNEE;  Surgeon: Vickey Huger, MD;  Location: [place];  Service: Orthopedics;  Laterality: Left;   Family History  Problem Relation Age of Onset  . Hypertension Mother   . Heart disease Mother   . Stroke Mother     HEMORRAGHIC  . Cancer Father 51    PANCREATIC  . Hypertension Father   . Hypertension Sister   . Thyroid disease Sister   . Stroke Sister   . Hypertension Brother    Social History  Substance Use Topics  . Smoking status: Former Smoker -- 2 years    Types: Cigarettes    Quit date: 06/07/1973  . Smokeless tobacco: None  . Alcohol Use: No      Review of Systems No chest pain, no SOB, no wheeze, no cough, + runny nose, + itchy eyes    Objective:   Physical Exam Physical Exam    Constitutional: Oriented to person, place, and time. She appears well-developed and well-nourished.  HENT:  Head: Normocephalic and atraumatic.  Eyes: Conjunctivae are normal.  Neck: Normal range of motion. Neck supple. Right side with 1 cm sebaceous cyst Cardiovascular: Normal rate, regular rhythm and normal heart sounds.   Pulmonary/Chest: Effort normal and breath sounds normal.  Musculoskeletal: Normal range of motion.  Neurological: Alert and oriented to person, place, and time.  Skin: Skin is warm and dry.  Psychiatric: Normal mood and affect. Behavior is normal. Judgment and thought content normal.  Vitals reviewed.     BP 134/74 mmHg  Pulse 80  Temp(Src) 99.2 F (37.3 C) (Oral)  Resp 16  Ht 5' (1.524 m)  Wt 124 lb (56.246 kg)  BMI 24.22 kg/m2 Wt Readings from Last 3 Encounters:  09/30/15 124 lb (56.246 kg)  04/02/15 121 lb (54.885 kg)  05/10/14 118 lb (53.524 kg)   Depression screen [facility] 2/9 09/30/2015 04/02/2015 05/10/2014 04/11/2014 11/30/2013  Decreased Interest 0 0 0 0 0  Down, Depressed, Hopeless 0 0 0 0 1  PHQ - 2 Score  0 0 0 0 1        Assessment & Plan:  1. Inflamed sebaceous cyst - Ambulatory referral to Dermatology  2. Other seasonal allergic rhinitis - continue singulair and daily flonase   3. Osteoporosis - she is due for bone density study and is agreeable to wait for results and discuss further treatment  4. Asthma, mild intermittent, uncomplicated - well controlled on Advair and Singulair  - follow up in 6 months  Clarene Reamer, FNP-BC  [facility] [place] [facility]  10/04/2015 6:58 AM

## 2015-09-30 NOTE — Patient Instructions (Addendum)
Please call the [facility] and see when your last bone density study was performed. Have them fax to [place] at [phone]  I have put in an order for a new bone density study, let's review the results and discuss whether or not to continue treatment with Fosamax.     IF you received an x-ray today, you will receive an invoice from [facility]. Please contact [facility] at [phone] with questions or concerns regarding your invoice.   IF you received labwork today, you will receive an invoice from [employer]. Please contact [facility] at [phone] with questions or concerns regarding your invoice.   Our billing staff will not be able to assist you with questions regarding bills from these companies.  You will be contacted with the lab results as soon as they are available. The fastest way to get your results is to activate your My Chart account. Instructions are located on the last page of this paperwork. If you have not heard from [place] regarding the results in 2 weeks, please contact this office.

## 2015-10-01 ENCOUNTER — Other Ambulatory Visit: Payer: Self-pay | Admitting: Family Medicine

## 2015-10-01 ENCOUNTER — Ambulatory Visit: Payer: Medicare Other | Admitting: Family Medicine

## 2015-10-15 ENCOUNTER — Other Ambulatory Visit: Payer: Self-pay | Admitting: Family Medicine

## 2016-02-24 ENCOUNTER — Other Ambulatory Visit: Payer: Self-pay | Admitting: Family Medicine

## 2016-02-24 MED ORDER — MONTELUKAST SODIUM 10 MG PO TABS: 10.0000 mg | ORAL_TABLET | Freq: Every day | ORAL | 1 refills | Status: DC

## 2016-02-24 NOTE — Telephone Encounter (Signed)
Received faxed refill request for   montelukast (SINGULAIR) 10 MG tablet  Last prescribed on 07/29/2015. Patient has PCP as Dr Leward Quan. Is the patient transferring to you?

## 2016-04-06 ENCOUNTER — Other Ambulatory Visit: Payer: Self-pay

## 2016-04-06 DIAGNOSIS — J302 Other seasonal allergic rhinitis: Secondary | ICD-10-CM

## 2016-04-06 MED ORDER — FLUTICASONE PROPIONATE 50 MCG/ACT NA SUSP: 1.0000 | Freq: Every day | NASAL | 0 refills | Status: DC

## 2016-04-06 NOTE — Telephone Encounter (Signed)
09/30/15 last ov

## 2016-04-08 ENCOUNTER — Ambulatory Visit (INDEPENDENT_AMBULATORY_CARE_PROVIDER_SITE_OTHER): Payer: Medicare Other

## 2016-04-08 ENCOUNTER — Ambulatory Visit (INDEPENDENT_AMBULATORY_CARE_PROVIDER_SITE_OTHER): Payer: Medicare Other | Admitting: Family Medicine

## 2016-04-08 VITALS — BP 118/76 | HR 83 | Temp 98.4°F | Resp 16 | Ht 59.0 in | Wt 123.0 lb

## 2016-04-08 DIAGNOSIS — M25572 Pain in left ankle and joints of left foot: Secondary | ICD-10-CM

## 2016-04-08 MED ORDER — PREDNISONE 20 MG PO TABS: ORAL_TABLET | ORAL | 0 refills | Status: DC

## 2016-04-08 NOTE — Progress Notes (Signed)
Patient ID: AMALI PLANER, female    DOB: 1946-02-25, 70 y.o.   MRN: [id number]  PCP: [name], MD  Chief Complaint  Patient presents with  . Gout    left ankle, x 3 weeks     Subjective:   HPI Presents for evaluation of left ankle swelling and painful times 3 weeks.  Colchine [facility] Swelling hasn't decreased at all within 3 weeks. Sore to touch and worst with weight-bearing She had also previously applied ice and taken Aleve [facility]. No hx of gout. Father had hx of gout.  Review of Systems     Patient Active Problem List   Diagnosis Date Noted  . History of artificial joint 01/01/2014  . Osteoarthritis of left knee 12/26/2013  . UNSPECIFIED ASTHMA 03/14/2013  . OSTEOPOROSIS 06/22/2011  . Psoriasis 06/22/2011  . Lower back pain 06/22/2011  . Vitamin D deficiency 06/22/2011  . DEPRESSION 06/30/2007  . HYPERTENSION 06/30/2007  . ARTHRITIS 06/30/2007  . ALLERGIC RHINITIS 01/19/2007  . GERD 01/19/2007  . BREAST CANCER, HX OF 01/19/2007  . ESOPHAGEAL STRICTURE 08/24/2004  . DIVERTICULOSIS OF COLON 07/28/2004     Prior to Admission medications   Medication Sig Start Date End Date Taking? Authorizing Provider  acetaminophen (TYLENOL) 500 MG tablet Take 500 mg by mouth every 8 (eight) hours as needed for moderate pain.   Yes Historical Provider, MD  alendronate (FOSAMAX) 70 MG tablet TAKE 1 TABLET BY MOUTH ONCE A WEEK 10/16/15  Yes Elby Beck, FNP  aluminum & magnesium hydroxide-simethicone (MYLANTA) 500-450-40 MG/5ML suspension Take by mouth every 6 (six) hours as needed for indigestion.   Yes Historical Provider, MD  fluticasone (FLONASE) 50 MCG/ACT nasal spray Place 1 spray into both nostrils daily. Patient needs to establish with a new pcp 04/06/16  Yes Mancel Bale, PA-C  Fluticasone-Salmeterol (ADVAIR DISKUS) 250-50 MCG/DOSE AEPB INHALE 2 PUFFS EVERY DAY RINSE MOUTH AFTER [place] 04/02/15   Yes Elby Beck, FNP  Ketotifen Fumarate (ALAWAY OP) Place 1 drop into both eyes daily as needed (for allergies / itching).   Yes Historical Provider, MD  montelukast (SINGULAIR) 10 MG tablet Take 1 tablet (10 mg total) by mouth at bedtime. 02/24/16  Yes Elby Beck, FNP  naproxen sodium (ANAPROX) 220 MG tablet Take 220 mg by mouth daily as needed (for pain).   Yes Historical Provider, MD  Polyethyl Glycol-Propyl Glycol (SYSTANE OP) Place 1 drop into both eyes daily as needed (for dry eyes).   Yes Historical Provider, MD     Allergies  Allergen Reactions  . Aspirin Other (See Comments)    GI UPSET-STOMACH BURNS  . Ibuprofen Other (See Comments)    STOMACH BURNS SLIGHTLY       Objective:  Physical Exam  Constitutional: She is oriented to person, place, and time. She appears well-developed and well-nourished.  HENT:  Head: Normocephalic and atraumatic.  Right Ear: External ear normal.  Left Ear: External ear normal.  Eyes: Conjunctivae are normal. Pupils are equal, round, and reactive to light.  Neck: Normal range of motion. Neck supple.  Cardiovascular: Normal rate, regular rhythm, normal heart sounds and intact distal pulses.   Pulmonary/Chest: Effort normal.  Musculoskeletal:       Left ankle: She exhibits swelling. Tenderness.  Complete active ROM bilaterally R/L ankle   Neurological: She is alert and oriented to person, place, and time.  Skin: Skin is warm and dry.  Psychiatric: She has a normal mood and affect. Her behavior is normal. Judgment and thought content normal.      Dg Ankle Complete Left  Result Date: 04/08/2016 CLINICAL DATA:  Left ankle pain and swelling for 3 weeks. No known injury. EXAM: LEFT ANKLE COMPLETE - 3+ VIEW COMPARISON:  None. FINDINGS: There is no evidence of fracture, dislocation, or joint effusion. There is no evidence of arthropathy or other focal bone abnormality involving the ankle. Small plantar calcaneal bone spur incidentally  noted. IMPRESSION: No radiographic abnormality of ankle identified. Small plantar calcaneal bone spur noted. Electronically Signed   By: Earle Gell M.D.   On: 04/08/2016 12:03     Assessment & Plan:  1. Pain of joint of left ankle and foot - DG Ankle Complete Left; Future   Plan: - Apply ASO ankle  -Take Prednisone 20 mg,  in mornings with breakfast as follows:  Take 3 pills for 3 days, Take 2 pills for 3 days, and Take 1 pill for 3 days.  Complete all medication.  While taking prednisone, take only Tylenol 650 every 6 hours as needed for pain.  Continue to apply ice and elevated left foot while sitting.  Return for follow-up 04/16/16.  Carroll Sage. Kenton Kingfisher, MSN, FNP-C [facility]

## 2016-04-08 NOTE — Patient Instructions (Signed)
Take Prednisone 20 mg,  in mornings with breakfast as follows:  Take 3 pills for 3 days, Take 2 pills for 3 days, and Take 1 pill for 3 days.  Complete all medication.  While taking prednisone, take only Tylenol 650 every 6 hours as needed for pain.  Continue to apply ice and elevated left foot while sitting.  Return for follow-up 04/16/16.    IF you received an x-ray today, you will receive an invoice from [facility]. Please contact [facility] at [phone] with questions or concerns regarding your invoice.   IF you received labwork today, you will receive an invoice from [employer]. Please contact [facility] at [phone] with questions or concerns regarding your invoice.   Our billing staff will not be able to assist you with questions regarding bills from these companies.  You will be contacted with the lab results as soon as they are available. The fastest way to get your results is to activate your My Chart account. Instructions are located on the last page of this paperwork. If you have not heard from [place] regarding the results in 2 weeks, please contact this office.

## 2016-04-10 ENCOUNTER — Other Ambulatory Visit: Payer: Self-pay

## 2016-04-10 MED ORDER — FLUTICASONE-SALMETEROL 250-50 MCG/DOSE IN AEPB: INHALATION_SPRAY | RESPIRATORY_TRACT | 0 refills | Status: DC

## 2016-04-10 NOTE — Telephone Encounter (Signed)
09/2015 last asthma check. 03/2015 last refill with additional.

## 2016-04-11 NOTE — Telephone Encounter (Signed)
Please refill.

## 2016-04-16 ENCOUNTER — Ambulatory Visit (INDEPENDENT_AMBULATORY_CARE_PROVIDER_SITE_OTHER): Payer: Medicare Other | Admitting: Family Medicine

## 2016-04-16 VITALS — BP 132/76 | HR 70 | Temp 98.4°F | Resp 16 | Ht 58.5 in | Wt 124.2 lb

## 2016-04-16 DIAGNOSIS — M25572 Pain in left ankle and joints of left foot: Secondary | ICD-10-CM

## 2016-04-16 NOTE — Patient Instructions (Signed)
     IF you received an x-ray today, you will receive an invoice from [facility]. Please contact [facility] at [phone] with questions or concerns regarding your invoice.   IF you received labwork today, you will receive an invoice from [facility]. Please contact [facility] at [phone] with questions or concerns regarding your invoice.   Our billing staff will not be able to assist you with questions regarding bills from these companies.  You will be contacted with the lab results as soon as they are available. The fastest way to get your results is to activate your My Chart account. Instructions are located on the last page of this paperwork. If you have not heard from us regarding the results in 2 weeks, please contact this office.      

## 2016-04-16 NOTE — Progress Notes (Deleted)
   Patient ID: Alyssa Nielsen, female    DOB: January 30, 1946, 70 y.o.   MRN: [id number]  PCP: [name], MD  Chief Complaint  Patient presents with  . Follow-up    Lt Ankle    Subjective:   HPI 70 year old female presents for evaluation of left ankle injury times one week ago. Reports complete resolution of ankle swelling and pain.  She has one prednisone remaining and continued to wear ankle brace for comfort.    Review of Systems See HPI   Patient Active Problem List   Diagnosis Date Noted  . History of artificial joint 01/01/2014  . Osteoarthritis of left knee 12/26/2013  . UNSPECIFIED ASTHMA 03/14/2013  . OSTEOPOROSIS 06/22/2011  . Psoriasis 06/22/2011  . Lower back pain 06/22/2011  . Vitamin D deficiency 06/22/2011  . DEPRESSION 06/30/2007  . HYPERTENSION 06/30/2007  . ARTHRITIS 06/30/2007  . ALLERGIC RHINITIS 01/19/2007  . GERD 01/19/2007  . BREAST CANCER, HX OF 01/19/2007  . ESOPHAGEAL STRICTURE 08/24/2004  . DIVERTICULOSIS OF COLON 07/28/2004     Prior to Admission medications   Medication Sig Start Date End Date Taking? Authorizing Provider  acetaminophen (TYLENOL) 500 MG tablet Take 500 mg by mouth every 8 (eight) hours as needed for moderate pain.   Yes Historical Provider, MD  alendronate (FOSAMAX) 70 MG tablet TAKE 1 TABLET BY MOUTH ONCE A WEEK 10/16/15  Yes Elby Beck, FNP  aluminum & magnesium hydroxide-simethicone (MYLANTA) 500-450-40 MG/5ML suspension Take by mouth every 6 (six) hours as needed for indigestion.   Yes Historical Provider, MD  fluticasone (FLONASE) 50 MCG/ACT nasal spray Place 1 spray into both nostrils daily. Patient needs to establish with a new pcp 04/06/16  Yes Mancel Bale, PA-C  Fluticasone-Salmeterol (ADVAIR DISKUS) 250-50 MCG/DOSE AEPB INHALE 2 PUFFS EVERY DAY RINSE MOUTH AFTER [place] 04/10/16  Yes Sedalia Muta, FNP  Ketotifen Fumarate (ALAWAY OP) Place 1 drop into both eyes daily as needed (for allergies /  itching).   Yes Historical Provider, MD  montelukast (SINGULAIR) 10 MG tablet Take 1 tablet (10 mg total) by mouth at bedtime. 02/24/16  Yes Elby Beck, FNP  naproxen sodium (ANAPROX) 220 MG tablet Take 220 mg by mouth daily as needed (for pain).   Yes Historical Provider, MD  Polyethyl Glycol-Propyl Glycol (SYSTANE OP) Place 1 drop into both eyes daily as needed (for dry eyes).   Yes Historical Provider, MD  predniSONE (DELTASONE) 20 MG tablet Take 3 PO QAM x3days, 2 PO QAM x3days, 1 PO QAM x3days 04/08/16  Yes Sedalia Muta, FNP     Allergies  Allergen Reactions  . Aspirin Other (See Comments)    GI UPSET-STOMACH BURNS  . Ibuprofen Other (See Comments)    STOMACH BURNS SLIGHTLY       Objective:  Physical Exam     Vitals:   04/16/16 1048  BP: 132/76  Pulse: 70  Resp: 16  Temp: 98.4 F (36.9 C)     Assessment & Plan:   ***

## 2016-04-23 NOTE — Progress Notes (Signed)
Follow-up of Left Ankle Pain and Swelling  70 year old female presents for evaluation of left ankle injury times one week ago. Reports complete resolution of ankle swelling and pain.  She was originally placed on a prednisone taper and reports that she has one dose remaining. She has continued to wear her ankle brace for support and comfort only. Denies any tenderness to touch or laxity of ankle.   Physical Examination Cardiovascular- Normal rate Respiratory-Normal effort  Left ankle  Complete ROM, Negative of erythema or edema, Negative of bony tenderness Vitals:   04/16/16 1048  BP: 132/76  Pulse: 70  Resp: 16  Temp: 98.4 F (36.9 C)   Assessment and Plan:  Complete last dose of prednisone. Appears ankle inflammation has completely resolved. No additional follow-up required regarding this problem. Follow-up as needed.  Carroll Sage. Kenton Kingfisher, MSN, FNP-C [facility]

## 2016-05-12 ENCOUNTER — Ambulatory Visit (INDEPENDENT_AMBULATORY_CARE_PROVIDER_SITE_OTHER): Payer: Medicare Other | Admitting: Physician Assistant

## 2016-05-12 ENCOUNTER — Telehealth: Payer: Self-pay | Admitting: Physician Assistant

## 2016-05-12 ENCOUNTER — Encounter: Payer: Self-pay | Admitting: Physician Assistant

## 2016-05-12 ENCOUNTER — Encounter: Payer: Self-pay | Admitting: Family Medicine

## 2016-05-12 VITALS — BP 152/82 | HR 86 | Temp 98.3°F | Resp 16 | Wt 120.0 lb

## 2016-05-12 DIAGNOSIS — Z1159 Encounter for screening for other viral diseases: Secondary | ICD-10-CM | POA: Diagnosis not present

## 2016-05-12 DIAGNOSIS — E559 Vitamin D deficiency, unspecified: Secondary | ICD-10-CM

## 2016-05-12 DIAGNOSIS — E2839 Other primary ovarian failure: Secondary | ICD-10-CM | POA: Diagnosis not present

## 2016-05-12 DIAGNOSIS — Z0000 Encounter for general adult medical examination without abnormal findings: Secondary | ICD-10-CM | POA: Diagnosis not present

## 2016-05-12 DIAGNOSIS — Z853 Personal history of malignant neoplasm of breast: Secondary | ICD-10-CM | POA: Diagnosis not present

## 2016-05-12 DIAGNOSIS — R5383 Other fatigue: Secondary | ICD-10-CM

## 2016-05-12 DIAGNOSIS — K5900 Constipation, unspecified: Secondary | ICD-10-CM

## 2016-05-12 DIAGNOSIS — Z1211 Encounter for screening for malignant neoplasm of colon: Secondary | ICD-10-CM | POA: Diagnosis not present

## 2016-05-12 DIAGNOSIS — J302 Other seasonal allergic rhinitis: Secondary | ICD-10-CM

## 2016-05-12 DIAGNOSIS — R1013 Epigastric pain: Secondary | ICD-10-CM

## 2016-05-12 DIAGNOSIS — E785 Hyperlipidemia, unspecified: Secondary | ICD-10-CM

## 2016-05-12 MED ORDER — FLUTICASONE PROPIONATE 50 MCG/ACT NA SUSP: 1.0000 | Freq: Every day | NASAL | 0 refills | Status: DC

## 2016-05-12 NOTE — Progress Notes (Signed)
[facility] [address] [place] [address] [phone]  Date:  05/12/2016   Name:  Alyssa Nielsen   DOB:  Dec 25, 1945   MRN:  [id number]  PCP:  [name], MD    Chief Complaint: Annual Exam (CPE)   History of Present Illness:  This is a pleasant 70 y.o. female with PMH osteoarthritis, osteoporosis, GERD, Asthma, Arthritis, ulcers, allergies, breast cancer, cataracts who is presenting for CPE. Feeling well today.  Denies hearing loss. Denies depression or feeling down or blue.  Advanced directive and living will discussed with patient. She has not considered her options.  Complaints: Fatigue,Back pain - chronic. History of OA and osteoporisis. Generalized occasional abdominal pain. Bowel movements daily, describes them as sometimes watery, sometimes small pellets. Denies blood in stool.  Last pap: Normal Immunizations: UTD Dentist: Sees regularly Eye: She is overdue for eye exam  Diet/Exercise: Walks daily. Eats a healthy diet, whole grains, nuts, peanut butter, berries, fruit, vegetables. She does not eat out much.  Fam hx: Mother, father, sister, brother HTN. Father - cancer. Mother - heart disease and stroke. Sister stroke. Sister thyroid disease.   Tobacco/alcohol/substance use: None  Osteoarthritis - Controlled with Aleve, sometimes takes Tylenol.  Estrogen deficiency - Takes Fosamax. Would like to stop taking this as she does not like taking medications. Denies side effects.  Mammogram: Overdue. Will call to schedule in January.  Colonoscopy: Overdue.  Dexa: Overdue.    Review of Systems:  Review of Systems  Constitutional: Positive for fatigue. Negative for chills, diaphoresis and fever.  HENT: Negative for congestion, postnasal drip, rhinorrhea, sinus pressure, sneezing and sore throat.   Respiratory: Negative for cough, chest tightness, shortness of breath and wheezing.   Cardiovascular: Negative for chest pain and palpitations.   Gastrointestinal: Positive for constipation. Negative for abdominal pain, diarrhea, nausea and vomiting.  Genitourinary: Negative for decreased urine volume, difficulty urinating, dysuria, enuresis, flank pain, frequency, hematuria and urgency.  Musculoskeletal: Positive for arthralgias and back pain. Negative for neck pain and neck stiffness.  Neurological: Negative for dizziness, weakness, light-headedness and headaches.  Psychiatric/Behavioral: Negative for dysphoric mood.    Patient Active Problem List   Diagnosis Date Noted  . History of artificial joint 01/01/2014  . Osteoarthritis of left knee 12/26/2013  . UNSPECIFIED ASTHMA 03/14/2013  . OSTEOPOROSIS 06/22/2011  . Psoriasis 06/22/2011  . Lower back pain 06/22/2011  . Vitamin D deficiency 06/22/2011  . DEPRESSION 06/30/2007  . HYPERTENSION 06/30/2007  . ARTHRITIS 06/30/2007  . ALLERGIC RHINITIS 01/19/2007  . GERD 01/19/2007  . BREAST CANCER, HX OF 01/19/2007  . ESOPHAGEAL STRICTURE 08/24/2004  . DIVERTICULOSIS OF COLON 07/28/2004    Prior to Admission medications   Medication Sig Start Date End Date Taking? Authorizing Provider  acetaminophen (TYLENOL) 500 MG tablet Take 500 mg by mouth every 8 (eight) hours as needed for moderate pain.   Yes Historical Provider, MD  alendronate (FOSAMAX) 70 MG tablet TAKE 1 TABLET BY MOUTH ONCE A WEEK 10/16/15  Yes Elby Beck, FNP  aluminum & magnesium hydroxide-simethicone (MYLANTA) 500-450-40 MG/5ML suspension Take by mouth every 6 (six) hours as needed for indigestion.   Yes Historical Provider, MD  fluticasone (FLONASE) 50 MCG/ACT nasal spray Place 1 spray into both nostrils daily. Patient needs to establish with a new pcp 04/06/16  Yes Mancel Bale, PA-C  Fluticasone-Salmeterol (ADVAIR DISKUS) 250-50 MCG/DOSE AEPB INHALE 2 PUFFS EVERY DAY RINSE MOUTH AFTER [place] 04/10/16  Yes Sedalia Muta, FNP  Ketotifen Fumarate (ALAWAY OP) Place 1 drop into both eyes daily as needed  (for allergies / itching).   Yes Historical Provider, MD  montelukast (SINGULAIR) 10 MG tablet Take 1 tablet (10 mg total) by mouth at bedtime. 02/24/16  Yes Elby Beck, FNP  naproxen sodium (ANAPROX) 220 MG tablet Take 220 mg by mouth daily as needed (for pain).   Yes Historical Provider, MD  Polyethyl Glycol-Propyl Glycol (SYSTANE OP) Place 1 drop into both eyes daily as needed (for dry eyes).   Yes Historical Provider, MD    Allergies  Allergen Reactions  . Aspirin Other (See Comments)    GI UPSET-STOMACH BURNS  . Ibuprofen Other (See Comments)    STOMACH BURNS SLIGHTLY    Past Surgical History:  Procedure Laterality Date  . BREAST SURGERY  1999  1996   R BREAST (CANCEROUS)  L BREAST (BENIGN)     . MOUTH SURGERY     WISDOM TEETH REMOVAL  . PARTIAL KNEE ARTHROPLASTY Left 12/26/2013   Procedure: UNICOMPARTMENTAL KNEE;  Surgeon: Vickey Huger, MD;  Location: [facility];  Service: Orthopedics;  Laterality: Left;  . TONSILLECTOMY     AGE 73    Social History  Substance Use Topics  . Smoking status: Former Smoker    Years: 2.00    Types: Cigarettes    Quit date: 06/07/1973  . Smokeless tobacco: Never Used  . Alcohol use No    Family History  Problem Relation Age of Onset  . Hypertension Mother   . Heart disease Mother   . Stroke Mother     HEMORRAGHIC  . Cancer Father 53    PANCREATIC  . Hypertension Father   . Hypertension Sister   . Thyroid disease Sister   . Stroke Sister   . Hypertension Brother     Medication list has been reviewed and updated.  Physical Examination:  Physical Exam  Constitutional: She is oriented to person, place, and time. She appears well-developed and well-nourished. No distress.  HENT:  Head: Normocephalic and atraumatic.  Mouth/Throat: Oropharynx is clear and moist.  Eyes: Conjunctivae and EOM are normal. Pupils are equal, round, and reactive to light.  Cardiovascular: Normal rate, regular rhythm, S1 normal, S2 normal, normal heart  sounds and normal pulses.   No murmur heard. Pulmonary/Chest: Effort normal and breath sounds normal. She has no wheezes.  Kyphosis   Abdominal: Soft. Normal appearance, normal aorta and bowel sounds are normal. She exhibits no abdominal bruit and no pulsatile midline mass. There is tenderness (mild) in the epigastric area. There is no CVA tenderness.  Musculoskeletal: Normal range of motion.  Neurological: She is alert and oriented to person, place, and time. She has normal reflexes.  Skin: Skin is warm and dry.  Psychiatric: She has a normal mood and affect. Her behavior is normal. Judgment and thought content normal.  Vitals reviewed.   BP (!) 152/82 (BP Location: Left Arm, Patient Position: Sitting, Cuff Size: Normal)   Pulse 86   Temp 98.3 F (36.8 C) (Oral)   Resp 16   Wt 120 lb (54.4 kg)   SpO2 98%   BMI 24.65 kg/m   Assessment and Plan: 1. Annual physical exam - CMP14+EGFR - Advanced directive and living will discussed with patient. She has not considered her options. Information discussed with and given to patient. 2. Screen for colon cancer - Ambulatory referral to Gastroenterology 3. Estrogen deficiency - DG DXA FRACTURE ASSESSMENT; Future 4. Need for hepatitis C screening test -  Hepatitis C antibody 5. Dyslipidemia - Lipid panel 6. Other fatigue - CBC - TSH 7. Vitamin D deficiency 8. Constipation, unspecified constipation type 9. Epigastric pain - Discussed increase in fluids, fitness and fiber to help with constipation. She is interested in joining a gym and working out with a Clinical research associate. Discussed importance of weight bearing exercises for osteoporosis. Advised to d/c NSAID for OA and switch to Tylenol.  10. History of breast cancer - She will call the breast center for a follow-up appt. She plans to schedule mammogram in January 2018.  Mercer Pod, PA-C  [facility] 05/12/2016 10:35 AM

## 2016-05-12 NOTE — Addendum Note (Signed)
Addended by: Dorise Hiss on: 05/12/2016 02:31 PM   Modules accepted: Orders

## 2016-05-12 NOTE — Patient Instructions (Addendum)
  For constipation   Make sure you are drinking enough water daily. Make sure you are getting enough fiber in your diet - this will make you regular - you can eat high fiber foods or use metamucil as a supplement - it is really important to drink enough water when using fiber supplements.  If your stools are hard or are formed balls or you have to strain a stool softener will help - use colace 2-3 capsule a day  For gentle treatment of constipation Use Miralax 1-2 capfuls a day until your stools are soft and regular and then decrease the usage - you can use this daily  For more aggressive treatment of constipation Use 4 capfuls of Colace and 6 doses of Miralax and drink it in 2 hours - this should result in several watery stools - if it does not repeat the next day and then go to daily miralax for a week to make sure your bowels are clean and retrained to work properly  For the most aggressive treatment of constipation Use 14 capfuls of Miralax in 1 gallon of fluid (gatoraid or water work well or a combination of the two) and drink over 12h - it is ok to eat during this time and then use Miralax 1 capful daily for about 2 weeks to prevent the constipation from returning    IF you received an x-ray today, you will receive an invoice from [facility]. Please contact [facility] at [phone] with questions or concerns regarding your invoice.   IF you received labwork today, you will receive an invoice from [employer]. Please contact [facility] at [phone] with questions or concerns regarding your invoice.   Our billing staff will not be able to assist you with questions regarding bills from these companies.  You will be contacted with the lab results as soon as they are available. The fastest way to get your results is to activate your My Chart account. Instructions are located on the last page of this paperwork. If you have not heard from [place]  regarding the results in 2 weeks, please contact this office.

## 2016-05-13 ENCOUNTER — Encounter: Payer: Self-pay | Admitting: Gastroenterology

## 2016-05-13 LAB — CMP14+EGFR
ALT: 18 IU/L (ref 0–32)
AST: 22 IU/L (ref 0–40)
Albumin/Globulin Ratio: 1.8 (ref 1.2–2.2)
Albumin: 4.2 g/dL (ref 3.5–4.8)
Alkaline Phosphatase: 101 IU/L (ref 39–117)
BUN/Creatinine Ratio: 14 (ref 12–28)
BUN: 9 mg/dL (ref 8–27)
Bilirubin Total: 0.5 mg/dL (ref 0.0–1.2)
CO2: 24 mmol/L (ref 18–29)
Calcium: 10 mg/dL (ref 8.7–10.3)
Chloride: 103 mmol/L (ref 96–106)
Creatinine, Ser: 0.63 mg/dL (ref 0.57–1.00)
GFR calc Af Amer: 104 mL/min/{1.73_m2} (ref >59)
GFR calc non Af Amer: 91 mL/min/{1.73_m2} (ref >59)
Globulin, Total: 2.3 g/dL (ref 1.5–4.5)
Glucose: 96 mg/dL (ref 65–99)
Potassium: 4.6 mmol/L (ref 3.5–5.2)
Sodium: 144 mmol/L (ref 134–144)
Total Protein: 6.5 g/dL (ref 6.0–8.5)

## 2016-05-13 LAB — CBC
Hematocrit: 43.5 % (ref 34.0–46.6)
Hemoglobin: 14.5 g/dL (ref 11.1–15.9)
MCH: 30 pg (ref 26.6–33.0)
MCHC: 33.3 g/dL (ref 31.5–35.7)
MCV: 90 fL (ref 79–97)
Platelets: 365 10*3/uL (ref 150–379)
RBC: 4.84 x10E6/uL (ref 3.77–5.28)
RDW: 12.9 % (ref 12.3–15.4)
WBC: 6.8 10*3/uL (ref 3.4–10.8)

## 2016-05-13 LAB — HEPATITIS C ANTIBODY: Hep C Virus Ab: <0.1 s/co ratio (ref 0.0–0.9)

## 2016-05-13 LAB — LIPID PANEL
Chol/HDL Ratio: 2.2 ratio units (ref 0.0–4.4)
Cholesterol, Total: 254 mg/dL — ABNORMAL HIGH (ref 100–199)
HDL: 118 mg/dL (ref >39)
LDL Calculated: 122 mg/dL — ABNORMAL HIGH (ref 0–99)
Triglycerides: 69 mg/dL (ref 0–149)
VLDL Cholesterol Cal: 14 mg/dL (ref 5–40)

## 2016-05-13 LAB — TSH: TSH: 1.83 u[IU]/mL (ref 0.450–4.500)

## 2016-05-26 ENCOUNTER — Encounter: Payer: Medicare Other | Admitting: Physician Assistant

## 2016-05-27 ENCOUNTER — Telehealth: Payer: Self-pay

## 2016-05-27 NOTE — Telephone Encounter (Signed)
Pt came into office to get her avs from previous visit and asked about her lab results from visit on 12/6 I advised her labs had not yet been reviewed and she would get a call when they were completed and reviewed    Please advise [phone]

## 2016-05-28 NOTE — Telephone Encounter (Signed)
Please see results and advise, lipid abn

## 2016-05-29 ENCOUNTER — Encounter: Payer: Self-pay | Admitting: Physician Assistant

## 2016-05-29 NOTE — Telephone Encounter (Signed)
Please call patient and let her know most of your lab works looks great! Thyroid, kidney, liver, salts in your blood all are normal. Negative for Hep C.  Your cholesterol levels continue to rise from your previous visits. Cholesterol is 254 and your LDL is 122. You are at approximately 14.1% risk of developing major heart or vascular disease in the next 10 years with your current cholesterol levels. At your visit you mentioned that you do not like taking mediations. I highly recommend starting you on statin therapy at this time in order to lower this risk of heart disease. However, we can do a three month period of focusing on lowering these levels with lifestyle modifications, then come back for recheck. If not improved, will start Statin therapy. I sent her a letter with all lab results.  Thank you!

## 2016-05-29 NOTE — Progress Notes (Signed)
Patient's lipids continue to rise, at her last appt she states she does not want to be on medications. Will advise lifestyle modifications and f/u in three months for recheck.  Other labs WNL.

## 2016-06-01 NOTE — Telephone Encounter (Signed)
Lm with note back

## 2016-06-25 ENCOUNTER — Other Ambulatory Visit: Payer: Self-pay | Admitting: Family Medicine

## 2016-07-05 ENCOUNTER — Other Ambulatory Visit: Payer: Self-pay | Admitting: Physician Assistant

## 2016-07-19 ENCOUNTER — Other Ambulatory Visit: Payer: Self-pay | Admitting: Physician Assistant

## 2016-07-19 DIAGNOSIS — J302 Other seasonal allergic rhinitis: Secondary | ICD-10-CM

## 2016-07-20 NOTE — Telephone Encounter (Signed)
Last annual exam was 05/2016 with PA-McVey. A 12 month supply was sent in for refills of Flonase.

## 2016-07-21 ENCOUNTER — Ambulatory Visit (AMBULATORY_SURGERY_CENTER): Payer: Self-pay

## 2016-07-21 VITALS — Ht 59.0 in | Wt 123.6 lb

## 2016-07-21 DIAGNOSIS — Z1211 Encounter for screening for malignant neoplasm of colon: Secondary | ICD-10-CM

## 2016-07-21 MED ORDER — NA SULFATE-K SULFATE-MG SULF 17.5-3.13-1.6 GM/177ML PO SOLN: 1.0000 | Freq: Once | ORAL | 0 refills | Status: AC

## 2016-07-21 NOTE — Progress Notes (Signed)
Patient is not allergic to eggs or soy. Patient is not taking diet pills. Patient is not on 02 at home.  Patient has not had a history of problems with anesthesia.  Patient declined emmi.

## 2016-08-04 ENCOUNTER — Ambulatory Visit (AMBULATORY_SURGERY_CENTER): Payer: Medicare Other | Admitting: Gastroenterology

## 2016-08-04 ENCOUNTER — Encounter: Payer: Self-pay | Admitting: Gastroenterology

## 2016-08-04 VITALS — BP 139/76 | HR 82 | Temp 99.1°F | Resp 10 | Ht 59.0 in | Wt 123.0 lb

## 2016-08-04 DIAGNOSIS — Z1212 Encounter for screening for malignant neoplasm of rectum: Secondary | ICD-10-CM | POA: Diagnosis not present

## 2016-08-04 DIAGNOSIS — Z1211 Encounter for screening for malignant neoplasm of colon: Secondary | ICD-10-CM | POA: Diagnosis not present

## 2016-08-04 MED ORDER — SODIUM CHLORIDE 0.9 % IV SOLN: 500.0000 mL | INTRAVENOUS | Status: DC

## 2016-08-04 NOTE — Patient Instructions (Signed)
YOU HAD AN ENDOSCOPIC PROCEDURE TODAY AT THE [facility]:   Refer to the procedure report that was given to you for any specific questions about what was found during the examination.  If the procedure report does not answer your questions, please call your gastroenterologist to clarify.  If you requested that your care partner not be given the details of your procedure findings, then the procedure report has been included in a sealed envelope for you to review at your convenience later.  YOU SHOULD EXPECT: Some feelings of bloating in the abdomen. Passage of more gas than usual.  Walking can help get rid of the air that was put into your GI tract during the procedure and reduce the bloating. If you had a lower endoscopy (such as a colonoscopy or flexible sigmoidoscopy) you may notice spotting of blood in your stool or on the toilet paper. If you underwent a bowel prep for your procedure, you may not have a normal bowel movement for a few days.  Please Note:  You might notice some irritation and congestion in your nose or some drainage.  This is from the oxygen used during your procedure.  There is no need for concern and it should clear up in a day or so.  SYMPTOMS TO REPORT IMMEDIATELY:   Following lower endoscopy (colonoscopy or flexible sigmoidoscopy):  Excessive amounts of blood in the stool  Significant tenderness or worsening of abdominal pains  Swelling of the abdomen that is new, acute  Fever of 100F or higher  For urgent or emergent issues, a gastroenterologist can be reached at any hour by calling [phone].   DIET:  We do recommend a small meal at first, but then you may proceed to your regular diet.  Drink plenty of fluids but you should avoid alcoholic beverages for 24 hours.  MEDICATIONS:  Continue present medications.  ACTIVITY:  You should plan to take it easy for the rest of today and you should NOT DRIVE or use heavy machinery until tomorrow (because of the  sedation medicines used during the test).    FOLLOW UP: Our staff will call the number listed on your records the next business day following your procedure to check on you and address any questions or concerns that you may have regarding the information given to you following your procedure. If we do not reach you, we will leave a message.  However, if you are feeling well and you are not experiencing any problems, there is no need to return our call.  We will assume that you have returned to your regular daily activities without incident.  If any biopsies were taken you will be contacted by phone or by letter within the next 1-3 weeks.  Please call us at [phone] if you have not heard about the biopsies in 3 weeks.   Thank you for allowing us to provide for your healthcare needs today.   SIGNATURES/CONFIDENTIALITY: You and/or your care partner have signed paperwork which will be entered into your electronic medical record.  These signatures attest to the fact that that the information above on your After Visit Summary has been reviewed and is understood.  Full responsibility of the confidentiality of this discharge information lies with you and/or your care-partner. 

## 2016-08-04 NOTE — Op Note (Signed)
[facility] Patient Name: Alyssa Nielsen Procedure Date: 08/04/2016 11:19 AM MRN: [id number] Endoscopist: [place]. Loletha Carrow , MD Age: 71 Referring MD:  Date of Birth: 1946/04/22 Gender: Female Account #: [id number] Procedure:                Colonoscopy Indications:              Screening for colorectal malignant neoplasm (no                            polyps on 2006 colonoscopy) Medicines:                Monitored Anesthesia Care Procedure:                Pre-Anesthesia Assessment:                           - Prior to the procedure, a History and Physical                            was performed, and patient medications and                            allergies were reviewed. The patient's tolerance of                            previous anesthesia was also reviewed. The risks                            and benefits of the procedure and the sedation                            options and risks were discussed with the patient.                            All questions were answered, and informed consent                            was obtained. Prior Anticoagulants: The patient has                            taken no previous anticoagulant or antiplatelet                            agents. ASA Grade Assessment: II - A patient with                            mild systemic disease. After reviewing the risks                            and benefits, the patient was deemed in                            satisfactory condition to undergo the procedure.  After obtaining informed consent, the colonoscope                            was passed under direct vision. Throughout the                            procedure, the patient's blood pressure, pulse, and                            oxygen saturations were monitored continuously. The                            Model PCF-H190DL [id number]) scope was introduced                            through the anus and advanced to  the the cecum,                            identified by appendiceal orifice and ileocecal                            valve. The colonoscopy was performed without                            difficulty. The patient tolerated the procedure                            well. The quality of the bowel preparation was                            good. The ileocecal valve, appendiceal orifice, and                            rectum were photographed. The quality of the bowel                            preparation was evaluated using the BBPS [facility]                            Bowel Preparation Scale) with scores of: Right                            Colon = 2, Transverse Colon = 2 and Left Colon = 2.                            The total BBPS score equals 6. The bowel                            preparation used was SUPREP. Scope In: 11:37:24 AM Scope Out: 11:48:16 AM Scope Withdrawal Time: 0 hours 6 minutes 55 seconds  Total Procedure Duration: 0 hours 10 minutes 52 seconds  Findings:                 The perianal  and digital rectal examinations were                            normal.                           Diverticula were found in the left colon.                           The exam was otherwise without abnormality on                            direct and retroflexion views. Complications:            No immediate complications. Estimated Blood Loss:     Estimated blood loss: none. Impression:               - Diverticulosis in the left colon.                           - The examination was otherwise normal on direct                            and retroflexion views.                           - No specimens collected. Recommendation:           - Patient has a contact number available for                            emergencies. The signs and symptoms of potential                            delayed complications were discussed with the                            patient. Return to normal activities  tomorrow.                            Written discharge instructions were provided to the                            patient.                           - Resume previous diet.                           - Continue present medications.                           - No recommendation at this time regarding repeat                            colonoscopy due to age. [name] L. Loletha Carrow, MD 08/04/2016 11:53:27 AM This report has been signed electronically.

## 2016-08-04 NOTE — Progress Notes (Signed)
To recovery, report to Hines, RN, VSS 

## 2016-08-05 ENCOUNTER — Telehealth: Payer: Self-pay

## 2016-08-05 ENCOUNTER — Telehealth: Payer: Self-pay | Admitting: *Deleted

## 2016-08-05 NOTE — Telephone Encounter (Signed)
  Follow up Call-  Call back number 08/04/2016  Post procedure Call Back phone  # [phone]  Permission to leave phone message Yes  Some recent data might be hidden     Patient questions:  Do you have a fever, pain , or abdominal swelling? No. Pain Score  0 *  Have you tolerated food without any problems? Yes.    Have you been able to return to your normal activities? Yes.    Do you have any questions about your discharge instructions: Diet   No. Medications  No. Follow up visit  No.  Do you have questions or concerns about your Care? No.  Actions: * If pain score is 4 or above: No action needed, pain <4.

## 2016-08-06 ENCOUNTER — Other Ambulatory Visit: Payer: Self-pay | Admitting: Family Medicine

## 2016-08-06 DIAGNOSIS — Z1231 Encounter for screening mammogram for malignant neoplasm of breast: Secondary | ICD-10-CM

## 2016-08-13 NOTE — Telephone Encounter (Signed)
Pt states she has had AWV and will bring report.

## 2016-08-16 ENCOUNTER — Telehealth: Payer: Self-pay

## 2016-08-16 ENCOUNTER — Ambulatory Visit: Payer: Medicare Other | Admitting: Family Medicine

## 2016-08-16 NOTE — Telephone Encounter (Signed)
Pre viist call completed.

## 2016-08-25 ENCOUNTER — Ambulatory Visit
Admission: RE | Admit: 2016-08-25 | Discharge: 2016-08-25 | Disposition: A | Discharge status: Home / Self Care | Payer: Medicare Other | Source: Ambulatory Visit | Attending: Family Medicine | Admitting: Family Medicine

## 2016-08-25 DIAGNOSIS — Z1231 Encounter for screening mammogram for malignant neoplasm of breast: Secondary | ICD-10-CM

## 2016-08-26 ENCOUNTER — Other Ambulatory Visit: Payer: Self-pay | Admitting: Family Medicine

## 2016-09-02 ENCOUNTER — Ambulatory Visit (INDEPENDENT_AMBULATORY_CARE_PROVIDER_SITE_OTHER): Payer: Medicare Other | Admitting: Family Medicine

## 2016-09-02 VITALS — BP 145/68 | HR 70 | Temp 98.4°F | Wt 123.0 lb

## 2016-09-02 DIAGNOSIS — E2839 Other primary ovarian failure: Secondary | ICD-10-CM

## 2016-09-02 DIAGNOSIS — M810 Age-related osteoporosis without current pathological fracture: Secondary | ICD-10-CM

## 2016-09-02 NOTE — Patient Instructions (Signed)
We will set you up for your bone density scan If you want to try adding a daily claritin to your regimen it may be helpful for you Please see me in about 6 months and happy easter!

## 2016-09-02 NOTE — Progress Notes (Addendum)
[place] at [facility] [address] [place], [place] [address] [phone]  Date:  09/02/2016   Name:  Alyssa Nielsen   DOB:  August 16, 1945   MRN:  [id number]  PCP:  Lamar Blinks, MD    Chief Complaint: No chief complaint on file.   History of Present Illness:  Alyssa Nielsen is a 71 y.o. very pleasant female patient who presents with the following:  bSeen by Ms. Whitney, PA-C in December for a CPE  She has history of OA, asthma, osteoporosis, vitamin D def, depression, HTN, breast cancer years ago.   Complete labs done in December- all looked fine except I do not see a vitamin D for her  She is overdue for a dexa scan- this was ordered at her CPE but has not been done as of yet   For her BP she is on no medication BP Readings from Last 3 Encounters:  08/04/16 139/76  05/12/16 (!) 152/82  04/16/16 132/76   She does have adviar, flonase, singulair for asthma.  Her sx are generally under control but can flare up with exposure to cold air She is also on fosamax - she has been on this for 10 years She needs a bone density scan There is some family history of osteoporosis- her sister Bethena Roys who is my patient also has this  She did smoke for about 2 years in her youth- quit many years ago She is from [employer]- she lives in [place].   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 History of artificial joint 01/01/2014  . Osteoarthritis of left knee 12/26/2013  . UNSPECIFIED ASTHMA 03/14/2013  . OSTEOPOROSIS 06/22/2011  . Psoriasis 06/22/2011  . Lower back pain 06/22/2011  . Vitamin D deficiency 06/22/2011  . DEPRESSION 06/30/2007  . HYPERTENSION 06/30/2007  . ARTHRITIS 06/30/2007  . ALLERGIC RHINITIS 01/19/2007  . GERD 01/19/2007  . BREAST CANCER, HX OF 01/19/2007  . ESOPHAGEAL STRICTURE 08/24/2004  . DIVERTICULOSIS OF COLON 07/28/2004     Past Medical History:  Diagnosis Date  . Allergy   . Anxiety   . Arthritis 2011 and 2013   spine and L knee  . Asthma   . Cancer [facility])    breast cancer  . GERD (gastroesophageal reflux disease)   . Headache(784.0)   . Hyperlipidemia   . Osteoporosis   . Ulcer ([place])    STOMACH    Past Surgical History:  Procedure Laterality Date  . BREAST EXCISIONAL BIOPSY Left 1996  . BREAST EXCISIONAL BIOPSY Right 1974  . BREAST LUMPECTOMY Right 1999   malignant  . BREAST SURGERY  1999  1996   R BREAST (CANCEROUS)  L BREAST (BENIGN)     . MOUTH SURGERY     WISDOM TEETH REMOVAL  . PARTIAL KNEE ARTHROPLASTY Left 12/26/2013   Procedure: UNICOMPARTMENTAL KNEE;  Surgeon: Vickey Huger, MD;  Location: [facility];  Service: Orthopedics;  Laterality: Left;  . TONSILLECTOMY     AGE 3    Social History  Substance Use Topics  . Smoking status: Former Smoker    Years: 2.00    Types: Cigarettes    Quit date: 06/07/1973  . Smokeless tobacco: Never Used  . Alcohol use No    Family History  Problem Relation Age of Onset  . Hypertension Mother   . Heart disease Mother   .  Stroke Mother     HEMORRAGHIC  . Cancer Father 11    PANCREATIC  . Hypertension Father   . Hypertension Sister   . Thyroid disease Sister   . Stroke Sister   . Hypertension Brother   . Colon cancer Cousin     Allergies  Allergen Reactions  . Aspirin Other (See Comments)    GI UPSET-STOMACH BURNS  . Ibuprofen Other (See Comments)    STOMACH BURNS SLIGHTLY    Medication list has been reviewed and updated.  Current Outpatient Prescriptions on File Prior to Visit  Medication Sig Dispense Refill  . acetaminophen (TYLENOL) 500 MG tablet Take 500 mg by mouth every 8 (eight) hours as needed for moderate pain.    Marland Kitchen ADVAIR DISKUS 250-50 MCG/DOSE AEPB INHALE 2 PUFFS EVERY DAY RINSE MOUTH AFTER USE 60 each 2  . alendronate (FOSAMAX) 70 MG tablet TAKE 1 TABLET BY MOUTH ONCE A WEEK 12 tablet 1  . aluminum & magnesium  hydroxide-simethicone (MYLANTA) 500-450-40 MG/5ML suspension Take by mouth every 6 (six) hours as needed for indigestion.    . Coenzyme Q10 (CO Q10) 100 MG CAPS Take 1 capsule by mouth daily.    . Cyanocobalamin (VITAMIN B-12) 500 MCG LOZG Take 3 lozenges by mouth 3 (three) times daily.    . fluticasone (FLONASE) 50 MCG/ACT nasal spray Place 2 sprays into both nostrils daily. 16 g 11  . Ketotifen Fumarate (ALAWAY OP) Place 1 drop into both eyes daily as needed (for allergies / itching).    . montelukast (SINGULAIR) 10 MG tablet TAKE 1 TABLET (10 MG TOTAL) BY MOUTH AT BEDTIME. 90 tablet 1  . niacin 500 MG tablet Take 500 mg by mouth daily.    . Omega-3 Fatty Acids (OMEGA 3 PO) Take 1 capsule by mouth daily.    Vladimir Faster Glycol-Propyl Glycol (SYSTANE OP) Place 1 drop into both eyes daily as needed (for dry eyes).     Current Facility-Administered Medications on File Prior to Visit  Medication Dose Route Frequency Provider Last Rate Last Dose  . 0.9 %  sodium chloride infusion  500 mL Intravenous Continuous Nelida Meuse III, MD        Review of Systems:  As per HPI- otherwise negative. She does not have glaucoma but has been noted to have higher pressure in her eyes No fever   Physical Examination: There were no vitals filed for this visit. Vitals:   09/02/16 1138  Weight: 123 lb (55.8 kg)   Body mass index is 24.84 kg/m. Ideal Body Weight:    GEN: WDWN, NAD, Non-toxic, A & O x 3, slight build, looks well HEENT: Atraumatic, Normocephalic. Neck supple. No masses, No LAD. Bilateral TM wnl, oropharynx normal.  PEERL,EOMI.   Ears and Nose: No external deformity. CV: RRR, No M/G/R. No JVD. No thrill. No extra heart sounds. PULM: CTA B, no wheezes, crackles, rhonchi. No retractions. No resp. distress. No accessory muscle use. ABD: S, NT, ND, +BS. No rebound. No HSM. EXTR: No c/c/e NEURO Normal gait.  PSYCH: Normally interactive. Conversant. Not depressed or anxious appearing.  Calm  demeanor.    Assessment and Plan: Estrogen deficiency - Plan: DG Bone Density  Osteoporosis, unspecified osteoporosis type, unspecified pathological fracture presence - Plan: DG Bone Density  Here today to establish care- she needs a dexa scan,she has been on fosamax for about 10 years.  Will order this for her. Otherwise she had a CPE in December- will plan to see her  in about 9 months   Signed Lamar Blinks, MD addnd 4/6-called regarding her bone density scan.  No answer- will send letter to pt Received her bone density scan- last Dexa in system from 2011.  At that time her left femur T score was -2.6, now at -2.7.  Otherwise cannot directly compare scans as different sites were used.   However would estimate that her bone density is stable to minimally worse.  As she has been on fosmax for 10 years can consider stopping this.  Letter to pt

## 2016-09-07 ENCOUNTER — Encounter: Payer: Self-pay | Admitting: Emergency Medicine

## 2016-09-08 ENCOUNTER — Ambulatory Visit (HOSPITAL_BASED_OUTPATIENT_CLINIC_OR_DEPARTMENT_OTHER)
Admission: RE | Admit: 2016-09-08 | Discharge: 2016-09-08 | Disposition: A | Discharge status: Home / Self Care | Payer: Medicare Other | Source: Ambulatory Visit | Attending: Family Medicine | Admitting: Family Medicine

## 2016-09-08 DIAGNOSIS — E2839 Other primary ovarian failure: Secondary | ICD-10-CM | POA: Insufficient documentation

## 2016-09-08 DIAGNOSIS — M810 Age-related osteoporosis without current pathological fracture: Secondary | ICD-10-CM | POA: Insufficient documentation

## 2016-09-08 DIAGNOSIS — Z87891 Personal history of nicotine dependence: Secondary | ICD-10-CM | POA: Diagnosis not present

## 2016-09-08 DIAGNOSIS — Z780 Asymptomatic menopausal state: Secondary | ICD-10-CM | POA: Diagnosis not present

## 2016-09-12 ENCOUNTER — Other Ambulatory Visit: Payer: Self-pay | Admitting: Physician Assistant

## 2016-10-07 NOTE — Telephone Encounter (Signed)
Phone note made in error ?

## 2016-11-29 NOTE — Telephone Encounter (Signed)
Erroneous

## 2017-02-08 ENCOUNTER — Other Ambulatory Visit: Payer: Self-pay | Admitting: Family Medicine

## 2017-02-13 NOTE — Progress Notes (Addendum)
[place] at [facility] [address], [place], [place] [address] [phone]  Date:  02/16/2017   Name:  Alyssa Nielsen   DOB:  1946/06/01   MRN:  [id number]  PCP:  Darreld Mclean, MD    Chief Complaint: Follow-up (Pt here for follow up visit. )   History of Present Illness:  Alyssa Nielsen is a 71 y.o. very pleasant female patient who presents with the following:  Here today for a follow-up visit- from our visit 3 months ago:  She has history of OA, asthma, osteoporosis, vitamin D def, depression, HTN, breast cancer years ago.   Complete labs done in December- all looked fine except I do not see a vitamin D for her  She is overdue for a dexa scan- this was ordered at her CPE but has not been done as of yet   For her BP she is on no medication    BP Readings from Last 3 Encounters:  08/04/16 139/76  05/12/16 (!) 152/82  04/16/16 132/76   She does have adviar, flonase, singulair for asthma.  Her sx are generally under control but can flare up with exposure to cold air She is also on fosamax - she has been on this for 10 years She needs a bone density scan There is some family history of osteoporosis- her sister Bethena Roys who is my patient also has this  She did smoke for about 2 years in her youth- quit many years ago She is from [employer]- she lives in [place].   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  However, we discussed prolia which may provide her more benefit.  She would be interested in looking into this option  She has noted abd distention, gassiness, and she will sometimes have abd pain.  This has been the case for a year or more- she is starting to get concerned.  She is not sure  if her sx may be in part due to height loss/ kyphosis of her spine Her bowels are generally ok, but she may have some constipation at times She feels like she becomes full earlier than she did in the past- she cannot eat much at one time  She has not had a hysterectomy   She had some cereal this am, berries.  Otherwise fasting   Wt Readings from Last 3 Encounters:  02/16/17 121 lb 6.4 oz (55.1 kg)  09/02/16 123 lb (55.8 kg)  08/04/16 123 lb (55.8 kg)     Patient Active Problem List   Diagnosis Date Noted  . History of artificial joint 01/01/2014  . Osteoarthritis of left knee 12/26/2013  . UNSPECIFIED ASTHMA 03/14/2013  . OSTEOPOROSIS 06/22/2011  . Psoriasis 06/22/2011  . Lower back pain 06/22/2011  . Vitamin D deficiency 06/22/2011  . DEPRESSION 06/30/2007  . HYPERTENSION 06/30/2007  . ARTHRITIS 06/30/2007  . ALLERGIC RHINITIS 01/19/2007  . GERD 01/19/2007  . BREAST CANCER, HX OF 01/19/2007  . ESOPHAGEAL STRICTURE 08/24/2004  . DIVERTICULOSIS OF COLON 07/28/2004    Past Medical History:  Diagnosis Date  . Allergy   . Anxiety   . Arthritis 2011 and 2013   spine and L knee  .  Asthma   . Cancer [facility])    breast cancer  . Chicken pox   . GERD (gastroesophageal reflux disease)   . Glaucoma   . Hay fever   . Headache(784.0)   . Hyperlipidemia   . Osteoporosis   . Personal history of urinary (tract) infections   . Ulcer    STOMACH    Past Surgical History:  Procedure Laterality Date  . BREAST EXCISIONAL BIOPSY Left 1996  . BREAST EXCISIONAL BIOPSY Right 1974  . BREAST LUMPECTOMY Right 1999   malignant  . BREAST SURGERY  1999  1996   R BREAST (CANCEROUS)  L BREAST (BENIGN)     . MOUTH SURGERY     WISDOM TEETH REMOVAL  . PARTIAL KNEE ARTHROPLASTY Left 12/26/2013   Procedure: UNICOMPARTMENTAL KNEE;  Surgeon: Vickey Huger, MD;  Location: [place];  Service: Orthopedics;  Laterality: Left;  . TONSILLECTOMY  1954    Social History  Substance Use Topics  . Smoking  status: Former Smoker    Years: 2.00    Types: Cigarettes    Quit date: 06/07/1973  . Smokeless tobacco: Never Used  . Alcohol use No    Family History  Problem Relation Age of Onset  . Hypertension Mother   . Heart disease Mother   . Stroke Mother        HEMORRAGHIC  . Cancer Father 63       PANCREATIC  . Hypertension Father   . Hypertension Sister   . Thyroid disease Sister   . Stroke Sister   . Hypertension Brother   . Colon cancer Cousin     Allergies  Allergen Reactions  . Aspirin Other (See Comments)    GI UPSET-STOMACH BURNS  . Ibuprofen Other (See Comments)    STOMACH BURNS SLIGHTLY    Medication list has been reviewed and updated.  Current Outpatient Prescriptions on File Prior to Visit  Medication Sig Dispense Refill  . acetaminophen (TYLENOL) 500 MG tablet Take 500 mg by mouth every 8 (eight) hours as needed for moderate pain.    Marland Kitchen ADVAIR DISKUS 250-50 MCG/DOSE AEPB INHALE 2 PUFFS EVERY DAY RINSE MOUTH AFTER USE 60 each 11  . aluminum & magnesium hydroxide-simethicone (MYLANTA) 500-450-40 MG/5ML suspension Take by mouth every 6 (six) hours as needed for indigestion.    . Coenzyme Q10 (CO Q10) 100 MG CAPS Take 1 capsule by mouth daily.    . fluticasone (FLONASE) 50 MCG/ACT nasal spray Place 2 sprays into both nostrils daily. 16 g 11  . Ketotifen Fumarate (ALAWAY OP) Place 1 drop into both eyes daily as needed (for allergies / itching).    . montelukast (SINGULAIR) 10 MG tablet Take 1 tablet (10 mg total) by mouth at bedtime. 90 tablet 1  . niacin 500 MG tablet Take 500 mg by mouth daily.    Vladimir Faster Glycol-Propyl Glycol (SYSTANE OP) Place 1 drop into both eyes daily as needed (for dry eyes).     Current Facility-Administered Medications on File Prior to Visit  Medication Dose Route Frequency Provider Last Rate Last Dose  . 0.9 %  sodium chloride infusion  500 mL Intravenous Continuous Danis, Estill Cotta III, MD        Review of Systems:  As per HPI-  otherwise negative.   Physical Examination: Vitals:   02/16/17 1111  BP: 140/73  Pulse: 85  Temp: 98.9 F (37.2 C)  SpO2: 98%   Vitals:   02/16/17 1111  Weight: 121 lb 6.4 oz (  55.1 kg)  Height: 4' 10.5" (1.486 m)   Body mass index is 24.94 kg/m. Ideal Body Weight: Weight in (lb) to have BMI = 25: 121.4  GEN: WDWN, NAD, Non-toxic, A & O x 3, petite build,looks well HEENT: Atraumatic, Normocephalic. Neck supple. No masses, No LAD. Ears and Nose: No external deformity. CV: RRR, No M/G/R. No JVD. No thrill. No extra heart sounds. PULM: CTA B, no wheezes, crackles, rhonchi. No retractions. No resp. distress. No accessory muscle use. ABD: S, NT, ND, +BS. No rebound. No HSM.  Possible mass vs increased stool in RLQ No tenderness at this time EXTR: No c/c/e NEURO Normal gait.  PSYCH: Normally interactive. Conversant. Not depressed or anxious appearing.  Calm demeanor.  Mild to moderate kyphosis is evident.  Pt is unable to straighten her spine completely    Assessment and Plan: Abdominal distension (gaseous) - Plan: CBC, Comprehensive metabolic panel, CT Abdomen Pelvis W Contrast  Immunization due - Plan: Flu vaccine HIGH DOSE PF (Fluzone High dose)  Age-related osteoporosis without current pathological fracture  Dyslipidemia - Plan: Lipid panel  Right lower quadrant abdominal mass - Plan: CT Abdomen Pelvis W Contrast  Early satiety - Plan: Lipase  Flu shot today Will look into prolia for her Cholesterol panel Early satiety, abd distention, possible RLQ mass- ordered a CT of her abd and pelvis today Will plan further follow- up pending labs.   Signed Lamar Blinks, MD  Results for orders placed or performed in visit on 02/16/17  CBC  Result Value Ref Range   WBC 5.5 4.0 - 10.5 K/uL   RBC 4.57 3.87 - 5.11 Mil/uL   Platelets 320.0 150.0 - 400.0 K/uL   Hemoglobin 13.6 12.0 - 15.0 g/dL   HCT 41.6 36.0 - 46.0 %   MCV 91.0 78.0 - 100.0 fl   MCHC 32.8 30.0 - 36.0  g/dL   RDW 12.9 11.5 - 15.5 %  Comprehensive metabolic panel  Result Value Ref Range   Sodium 140 135 - 145 mEq/L   Potassium 4.0 3.5 - 5.1 mEq/L   Chloride 102 96 - 112 mEq/L   CO2 28 19 - 32 mEq/L   Glucose, Bld 111 (H) 70 - 99 mg/dL   BUN 14 6 - 23 mg/dL   Creatinine, Ser 0.70 0.40 - 1.20 mg/dL   Total Bilirubin 0.6 0.2 - 1.2 mg/dL   Alkaline Phosphatase 83 39 - 117 U/L   AST 20 0 - 37 U/L   ALT 16 0 - 35 U/L   Total Protein 6.6 6.0 - 8.3 g/dL   Albumin 4.1 3.5 - 5.2 g/dL   Calcium 10.5 8.4 - 10.5 mg/dL   GFR 87.31 >60.00 mL/min  Lipid panel  Result Value Ref Range   Cholesterol 243 (H) 0 - 200 mg/dL   Triglycerides 61.0 0.0 - 149.0 mg/dL   HDL 134.00 >39.00 mg/dL   VLDL 12.2 0.0 - 40.0 mg/dL   LDL Cholesterol 97 0 - 99 mg/dL   Total CHOL/HDL Ratio 2    NonHDL 109.44   Lipase  Result Value Ref Range   Lipase 24.0 11.0 - 59.0 U/L     Received her CT scan 10/2- LMOM that CT is ok, will send her a copy in the mail  Ct Abdomen Pelvis W Contrast  Result Date: 03/06/2017 CLINICAL DATA:  Initial evaluation for abdominal distension, fullness after meals. EXAM: CT ABDOMEN AND PELVIS WITH CONTRAST TECHNIQUE: Multidetector CT imaging of the abdomen and pelvis was performed using  the standard protocol following bolus administration of intravenous contrast. CONTRAST:  14mL ISOVUE-300 IOPAMIDOL (ISOVUE-300) INJECTION 61% COMPARISON:  None available. FINDINGS: Lower chest: Visualized lung bases are clear. Hepatobiliary: 17 mm heterogeneous Lee enhancing lesion at the posterior right hepatic lobe noted (series 2, image 14), most consistent with a benign hemangioma. Liver otherwise demonstrates a normal contrast enhanced appearance. Gallbladder within normal limits. Common bile duct within normal limits measuring up to 5 mm. Mild intrahepatic biliary dilatation noted within the left hepatic lobe, of uncertain significance. Pancreas: Pancreas within normal limits without mass lesion or acute  inflammatory changes. Pancreatic duct measures within normal limits at 2 mm. Spleen: Spleen within normal limits. Adrenals/Urinary Tract: Adrenal glands are normal. Kidneys equal in size with symmetric enhancement. Punctate 2 mm nonobstructive stone present within the interpolar left kidney. 3 mm nonobstructive stone present within the interpolar right kidney. No hydronephrosis. No focal enhancing renal mass. No hydroureter. Bladder within normal limits. Stomach/Bowel: Mild diffuse wall thickening seen at the level of the gastric antrum (series 2, image 27). Finding is nonspecific, but could reflect sequelae of underlying gastritis. No hiatal hernia. Small bowel of normal caliber and appearance without evidence for obstruction or acute inflammation. Appendix well visualized within the right lower quadrant and is of normal caliber and appearance without associated inflammatory changes to suggest acute appendicitis. No abnormal wall thickening, mucosal enhancement, or inflammatory fat stranding seen elsewhere about the bowels. Oral contrast material reaches the distal descending colon. Vascular/Lymphatic: Normal intravascular enhancement seen throughout the intra- abdominal aorta and its branch vessels. No aneurysm. No pathologically enlarged intra-abdominal or pelvic lymph nodes identified. Reproductive: Few scattered calcific densities noted within the uterus. Uterus otherwise unremarkable. 11 mm cyst present within the left ovary. 2.5 cm cyst present within the right ovary. Ovaries otherwise unremarkable. Other: No free air or fluid. Small fat containing paraumbilical hernia noted. Musculoskeletal: No acute osseus abnormality. No worrisome lytic or blastic osseous lesions. Grade 1 anterolisthesis of L5 on S1. Dextroscoliosis of the lumbar spine with apex at L2-3. IMPRESSION: 1. Mild wall thickening about the gastric antrum. Finding is nonspecific, but could reflect the sequelae of underlying gastritis. Clinical  correlation recommended. 2. Mild intrahepatic biliary dilatation within the left hepatic lobe, of uncertain etiology or significance. Correlation with LFTs recommended. No extrahepatic biliary dilatation or other acute biliary pathology identified. 3. Bilateral nonobstructive nephrolithiasis. 4. 1.7 cm benign hemangioma within the right hepatic lobe. 5. Bilateral ovarian cysts measuring up to it 2.5 cm as above. These are almost certainly benign based on size and appearance, with no follow-up imaging recommended. 6. Dextroscoliosis with multilevel degenerative spondylolysis of the lumbar spine. Grade 1 anterolisthesis of L5 on S1. Electronically Signed   By: Jeannine Boga M.D.   On: 03/06/2017 15:33

## 2017-02-16 ENCOUNTER — Ambulatory Visit (INDEPENDENT_AMBULATORY_CARE_PROVIDER_SITE_OTHER): Payer: Medicare Other | Admitting: Family Medicine

## 2017-02-16 VITALS — BP 140/73 | HR 85 | Temp 98.9°F | Ht 58.5 in | Wt 121.4 lb

## 2017-02-16 DIAGNOSIS — Z23 Encounter for immunization: Secondary | ICD-10-CM | POA: Diagnosis not present

## 2017-02-16 DIAGNOSIS — M810 Age-related osteoporosis without current pathological fracture: Secondary | ICD-10-CM | POA: Diagnosis not present

## 2017-02-16 DIAGNOSIS — R6881 Early satiety: Secondary | ICD-10-CM

## 2017-02-16 DIAGNOSIS — R140 Abdominal distension (gaseous): Secondary | ICD-10-CM | POA: Diagnosis not present

## 2017-02-16 DIAGNOSIS — E785 Hyperlipidemia, unspecified: Secondary | ICD-10-CM

## 2017-02-16 DIAGNOSIS — R1903 Right lower quadrant abdominal swelling, mass and lump: Secondary | ICD-10-CM | POA: Diagnosis not present

## 2017-02-16 LAB — CBC
HCT: 41.6 % (ref 36.0–46.0)
HEMOGLOBIN: 13.6 g/dL (ref 12.0–15.0)
MCHC: 32.8 g/dL (ref 30.0–36.0)
MCV: 91 fl (ref 78.0–100.0)
Platelets: 320 10*3/uL (ref 150.0–400.0)
RBC: 4.57 Mil/uL (ref 3.87–5.11)
RDW: 12.9 % (ref 11.5–15.5)
WBC: 5.5 10*3/uL (ref 4.0–10.5)

## 2017-02-16 LAB — COMPREHENSIVE METABOLIC PANEL
ALBUMIN: 4.1 g/dL (ref 3.5–5.2)
ALK PHOS: 83 U/L (ref 39–117)
ALT: 16 U/L (ref 0–35)
AST: 20 U/L (ref 0–37)
BUN: 14 mg/dL (ref 6–23)
CO2: 28 mEq/L (ref 19–32)
Calcium: 10.5 mg/dL (ref 8.4–10.5)
Chloride: 102 mEq/L (ref 96–112)
Creatinine, Ser: 0.7 mg/dL (ref 0.40–1.20)
GFR: 87.31 mL/min (ref >60.00)
Glucose, Bld: 111 mg/dL — ABNORMAL HIGH (ref 70–99)
POTASSIUM: 4 meq/L (ref 3.5–5.1)
Sodium: 140 mEq/L (ref 135–145)
TOTAL PROTEIN: 6.6 g/dL (ref 6.0–8.3)
Total Bilirubin: 0.6 mg/dL (ref 0.2–1.2)

## 2017-02-16 LAB — LIPID PANEL
CHOLESTEROL: 243 mg/dL — AB (ref 0–200)
HDL: 134 mg/dL (ref >39.00)
LDL Cholesterol: 97 mg/dL (ref 0–99)
NonHDL: 109.44
TRIGLYCERIDES: 61 mg/dL (ref 0.0–149.0)
Total CHOL/HDL Ratio: 2
VLDL: 12.2 mg/dL (ref 0.0–40.0)

## 2017-02-16 LAB — LIPASE: Lipase: 24 U/L (ref 11.0–59.0)

## 2017-02-16 NOTE — Patient Instructions (Signed)
It was good to see you today!  I will look into prolia for you- you can do your research and then we can decide if we would like to use this medication I will be in touch with your labs asap We will set you up for a CT scan of your abdomen and pelvis to make sure all is well

## 2017-02-17 ENCOUNTER — Encounter: Payer: Self-pay | Admitting: Family Medicine

## 2017-03-05 ENCOUNTER — Ambulatory Visit (HOSPITAL_BASED_OUTPATIENT_CLINIC_OR_DEPARTMENT_OTHER)
Admission: RE | Admit: 2017-03-05 | Discharge: 2017-03-05 | Disposition: A | Discharge status: Home / Self Care | Payer: Medicare Other | Source: Ambulatory Visit | Attending: Family Medicine | Admitting: Family Medicine

## 2017-03-05 ENCOUNTER — Encounter (HOSPITAL_BASED_OUTPATIENT_CLINIC_OR_DEPARTMENT_OTHER): Payer: Self-pay

## 2017-03-05 DIAGNOSIS — D1803 Hemangioma of intra-abdominal structures: Secondary | ICD-10-CM | POA: Insufficient documentation

## 2017-03-05 DIAGNOSIS — K839 Disease of biliary tract, unspecified: Secondary | ICD-10-CM | POA: Diagnosis not present

## 2017-03-05 DIAGNOSIS — M4186 Other forms of scoliosis, lumbar region: Secondary | ICD-10-CM | POA: Insufficient documentation

## 2017-03-05 DIAGNOSIS — R140 Abdominal distension (gaseous): Secondary | ICD-10-CM | POA: Insufficient documentation

## 2017-03-05 DIAGNOSIS — K319 Disease of stomach and duodenum, unspecified: Secondary | ICD-10-CM | POA: Insufficient documentation

## 2017-03-05 DIAGNOSIS — K429 Umbilical hernia without obstruction or gangrene: Secondary | ICD-10-CM | POA: Insufficient documentation

## 2017-03-05 DIAGNOSIS — N83202 Unspecified ovarian cyst, left side: Secondary | ICD-10-CM | POA: Insufficient documentation

## 2017-03-05 DIAGNOSIS — R1903 Right lower quadrant abdominal swelling, mass and lump: Secondary | ICD-10-CM

## 2017-03-05 DIAGNOSIS — M4306 Spondylolysis, lumbar region: Secondary | ICD-10-CM | POA: Diagnosis not present

## 2017-03-05 DIAGNOSIS — N200 Calculus of kidney: Secondary | ICD-10-CM | POA: Insufficient documentation

## 2017-03-05 DIAGNOSIS — N83201 Unspecified ovarian cyst, right side: Secondary | ICD-10-CM | POA: Diagnosis not present

## 2017-03-05 MED ORDER — IOPAMIDOL (ISOVUE-300) INJECTION 61%
100.0000 mL | Freq: Once | INTRAVENOUS | Status: AC | PRN
  Administered 2017-03-05: 100 mL via INTRAVENOUS

## 2017-03-18 ENCOUNTER — Telehealth: Payer: Self-pay | Admitting: Family Medicine

## 2017-03-18 NOTE — Telephone Encounter (Signed)
Prolia benefits verified PA is required for [employer] -call  [phone] to obtain Subject to $183 ded-met 20% co-insurance which will be covered by [facility]  Patient may owe approximately $0 OOP

## 2017-04-05 ENCOUNTER — Other Ambulatory Visit: Payer: Self-pay | Admitting: Emergency Medicine

## 2017-04-08 DIAGNOSIS — H2510 Age-related nuclear cataract, unspecified eye: Secondary | ICD-10-CM

## 2017-04-08 HISTORY — DX: Age-related nuclear cataract, unspecified eye: H25.10

## 2017-04-27 ENCOUNTER — Encounter: Payer: Self-pay | Admitting: Family Medicine

## 2017-06-29 ENCOUNTER — Telehealth: Payer: Self-pay | Admitting: Family Medicine

## 2017-06-29 NOTE — Telephone Encounter (Signed)
Prolia benefits received PA is required- auth# [id number] valid thru 10.22.19 $185 ded 20% co-insurance Covered 100% by supplement unless OV charged then $40 copay    Patient may owe approximately $0 OOP   Letter mailed to inform patient of benefits and to schedule

## 2017-07-28 ENCOUNTER — Other Ambulatory Visit: Payer: Self-pay | Admitting: Urgent Care

## 2017-07-28 DIAGNOSIS — J302 Other seasonal allergic rhinitis: Secondary | ICD-10-CM

## 2017-08-03 ENCOUNTER — Other Ambulatory Visit: Payer: Self-pay | Admitting: Family Medicine

## 2017-08-24 ENCOUNTER — Other Ambulatory Visit: Payer: Self-pay | Admitting: Family Medicine

## 2018-03-10 ENCOUNTER — Telehealth: Payer: Self-pay

## 2018-03-10 NOTE — Telephone Encounter (Signed)
Author reviewing prolia status, and noticed pt. Has not only not received it, she has not been seen by PCP in over one year. Appointment made with PCP 10/21. Pt. Did not want to pursue prolia at this time.

## 2018-03-25 NOTE — Progress Notes (Signed)
[place] [facility] [address] [place], [place] [address] [phone]  Date:  03/27/2018   Name:  Alyssa Nielsen   DOB:  1945-07-16   MRN:  [id number]  PCP:  Darreld Mclean, MD    Chief Complaint: Annual Exam (discuss prolia-does not want to start it)   History of Present Illness:  Alyssa Nielsen is a 72 y.o. very pleasant female patient who presents with the following:  Pt in for "CPE" today- however as pt has medicare will bill as office visit  Last seen here a year ago: She has history of OA, asthma, osteoporosis, vitamin D def, depression, HTN, breast cancer years ago.  She is concerned about degenerative changes in her spine, and height loss We stopped her fosamax this spring because she had been on it for 10 years and her bones were not better.  However, we discussed prolia which may provide her more benefit.  She would be interested in looking into this option  We looked at starting Prolia but she did not end up doing it after our last visit.   She is still not sure if she wants to try this medication  Labs: one year ago- do today. She had just a light meal this am  Immun: mention shingrix Mammo: 3/18- due, she will set this up at the [facility] Colon: 2/18- Danis, negative  Dexa: 4/18 ASSESSMENT: The BMD measured at Femur Neck Left is 0.668 g/cm2 with a T-score of -2.7. This patient is considered osteoporotic according to [facility]) criteria. Lumbar spine was not utilized due to advanced degenerative changes.  Ht Readings from Last 3 Encounters:  03/27/18 4' 10.5" (1.486 m)  02/16/17 4' 10.5" (1.486 m)  08/04/16 4\' 11"  (1.499 m)   Her max height was 5'2" when she was younger   She has significant arthritis in her hands and uses tylenol which does help Her back is also painful but her exercises help here   Her sister in [place] is not doing well- history of stroke and  recent fall.  She is staying with Vaughan Basta at this time in [place] along with her other sister   She would like to have a tetanus booster and does have a small wound on her left thumb to justify this charge to medicare  She has felt more fatigued for a year or so- is not sure why this is.  She does note that she has a harder time when it is hot out- it makes her asthma worse- now that things are cooling down she is a bit better She is sleeping 7.5- 8 hours at night, does not nap She feels like she sleeps well at night She is not aware of snoring   advair flonase singulair Patient Active Problem List   Diagnosis Date Noted  . History of artificial joint 01/01/2014  . Osteoarthritis of left knee 12/26/2013  . UNSPECIFIED ASTHMA 03/14/2013  . OSTEOPOROSIS 06/22/2011  . Psoriasis 06/22/2011  . Lower back pain 06/22/2011  . Vitamin D deficiency 06/22/2011  . DEPRESSION 06/30/2007  . HYPERTENSION 06/30/2007  . ARTHRITIS 06/30/2007  . ALLERGIC RHINITIS 01/19/2007  . GERD 01/19/2007  . BREAST CANCER, HX OF 01/19/2007  . ESOPHAGEAL STRICTURE 08/24/2004  . DIVERTICULOSIS OF COLON 07/28/2004    Past Medical History:  Diagnosis Date  . Allergy   . Anxiety   . Arthritis 2011 and 2013  spine and L knee  . Asthma   . Cancer [facility])    breast cancer  . Chicken pox   . GERD (gastroesophageal reflux disease)   . Glaucoma   . Hay fever   . Headache(784.0)   . Hyperlipidemia   . Osteoporosis   . Personal history of urinary (tract) infections   . Ulcer    STOMACH    Past Surgical History:  Procedure Laterality Date  . BREAST EXCISIONAL BIOPSY Left 1996  . BREAST EXCISIONAL BIOPSY Right 1974  . BREAST LUMPECTOMY Right 1999   malignant  . BREAST SURGERY  1999  1996   R BREAST (CANCEROUS)  L BREAST (BENIGN)     . MOUTH SURGERY     WISDOM TEETH REMOVAL  . PARTIAL KNEE ARTHROPLASTY Left 12/26/2013   Procedure: UNICOMPARTMENTAL KNEE;  Surgeon: Vickey Huger, MD;  Location: [facility];  Service:  Orthopedics;  Laterality: Left;  . TONSILLECTOMY  1954    Social History   Tobacco Use  . Smoking status: Former Smoker    Years: 2.00    Types: Cigarettes    Last attempt to quit: 06/07/1973    Years since quitting: 44.8  . Smokeless tobacco: Never Used  Substance Use Topics  . Alcohol use: No  . Drug use: No    Family History  Problem Relation Age of Onset  . Hypertension Mother   . Heart disease Mother   . Stroke Mother        HEMORRAGHIC  . Cancer Father 78       PANCREATIC  . Hypertension Father   . Hypertension Sister   . Thyroid disease Sister   . Stroke Sister   . Hypertension Brother   . Colon cancer Cousin     Allergies  Allergen Reactions  . Nsaids Other (See Comments)    Pt stated they make her stomach burn  . Aspirin Other (See Comments)    GI UPSET-STOMACH BURNS  . Ibuprofen Other (See Comments)    STOMACH BURNS SLIGHTLY    Medication list has been reviewed and updated.  Current Outpatient Medications on File Prior to Visit  Medication Sig Dispense Refill  . acetaminophen (TYLENOL) 500 MG tablet Take 500 mg by mouth every 8 (eight) hours as needed for moderate pain.    Marland Kitchen ADVAIR DISKUS 250-50 MCG/DOSE AEPB INHALE 2 PUFFS EVERY DAY RINSE MOUTH AFTER USE 60 each 11  . aluminum & magnesium hydroxide-simethicone (MYLANTA) 500-450-40 MG/5ML suspension Take by mouth every 6 (six) hours as needed for indigestion.    . Calcium Citrate-Vitamin D (CITRACAL + D PO) Take 1 tablet by mouth daily.    . fluticasone (FLONASE) 50 MCG/ACT nasal spray PLACE 2 SPRAYS INTO BOTH NOSTRILS DAILY. 16 g 11  . ketorolac (ACULAR) 0.5 % ophthalmic solution INSTILL 1 DROP INTO BOTH EYES TWICE A DAY  6  . Ketotifen Fumarate (ALAWAY OP) Place 1 drop into both eyes daily as needed (for allergies / itching).    . Latanoprostene Bunod (VYZULTA) 0.024 % SOLN Apply 1 drop to eye at bedtime.    Marland Kitchen loratadine (CLARITIN) 10 MG tablet Take by mouth.    . montelukast (SINGULAIR) 10 MG tablet  Take 1 tablet (10 mg total) by mouth at bedtime. 90 tablet 3  . niacin 500 MG tablet Take 500 mg by mouth daily.    Vladimir Faster Glycol-Propyl Glycol (SYSTANE OP) Place 1 drop into both eyes daily as needed (for dry eyes).    . prednisoLONE  acetate (PRED FORTE) 1 % ophthalmic suspension 1 DROP IN RIGHT EYE TWICE A DAY AND ONE DROP IN THE LEFT EYE FOUR TIMES A DAY AS DIRECTED  6  . timolol (TIMOPTIC) 0.5 % ophthalmic solution INSTILL 1 DROP INTO LEFT EYE TWICE A DAY  1   Current Facility-Administered Medications on File Prior to Visit  Medication Dose Route Frequency Provider Last Rate Last Dose  . 0.9 %  sodium chloride infusion  500 mL Intravenous Continuous Danis, Estill Cotta III, MD        Review of Systems:  As per HPI- otherwise negative. No PMB No CP or SOB    Physical Examination: Vitals:   03/27/18 1304  BP: 122/88  Pulse: 77  Resp: 16  Temp: 98.2 F (36.8 C)  SpO2: 96%   Vitals:   03/27/18 1304  Weight: 120 lb 12.8 oz (54.8 kg)  Height: 4' 10.5" (1.486 m)   Body mass index is 24.82 kg/m. Ideal Body Weight: Weight in (lb) to have BMI = 25: 121.4  GEN: WDWN, NAD, Non-toxic, A & O x 3, petite build, kyphosis, OW looks well  HEENT: Atraumatic, Normocephalic. Neck supple. No masses, No LAD.  Bilateral TM wnl, oropharynx normal.  PEERL,EOMI.   Ears and Nose: No external deformity. CV: RRR, No M/G/R. No JVD. No thrill. No extra heart sounds. PULM: CTA B, no wheezes, crackles, rhonchi. No retractions. No resp. distress. No accessory muscle use. ABD: S, NT, ND, +BS. No rebound. No HSM. EXTR: No c/c/e NEURO Normal gait.  PSYCH: Normally interactive. Conversant. Not depressed or anxious appearing.  Calm demeanor.  Small wound left thumb- skin has split. No need for suture   Assessment and Plan: Age-related osteoporosis without current pathological fracture - Plan: Vitamin D (25 hydroxy), DG Bone Density  Dyslipidemia - Plan: Lipid panel  Vitamin D deficiency - Plan:  Vitamin D (25 hydroxy)  Medication monitoring encounter - Plan: CBC, Comprehensive metabolic panel  Open wound of left thumb, initial encounter - Plan: Td vaccine greater than or equal to 7yo preservative free IM  Fatigue, unspecified type - Plan: TSH  Mild intermittent asthma without complication  Following up today She has been off fosamax for a little over a year.,  Check dexa now to see if any change She will also get her mammogram Labs pending as above   Signed Lamar Blinks, MD Received her labs, letter to pt  Results for orders placed or performed in visit on 03/27/18  CBC  Result Value Ref Range   WBC 6.1 4.0 - 10.5 K/uL   RBC 4.43 3.87 - 5.11 Mil/uL   Platelets 277.0 150.0 - 400.0 K/uL   Hemoglobin 13.3 12.0 - 15.0 g/dL   HCT 40.0 36.0 - 46.0 %   MCV 90.4 78.0 - 100.0 fl   MCHC 33.3 30.0 - 36.0 g/dL   RDW 12.6 11.5 - 15.5 %  Comprehensive metabolic panel  Result Value Ref Range   Sodium 139 135 - 145 mEq/L   Potassium 4.0 3.5 - 5.1 mEq/L   Chloride 104 96 - 112 mEq/L   CO2 27 19 - 32 mEq/L   Glucose, Bld 93 70 - 99 mg/dL   BUN 14 6 - 23 mg/dL   Creatinine, Ser 0.68 0.40 - 1.20 mg/dL   Total Bilirubin 0.7 0.2 - 1.2 mg/dL   Alkaline Phosphatase 75 39 - 117 U/L   AST 21 0 - 37 U/L   ALT 17 0 - 35 U/L  Total Protein 6.2 6.0 - 8.3 g/dL   Albumin 4.0 3.5 - 5.2 g/dL   Calcium 10.3 8.4 - 10.5 mg/dL   GFR 90.00 >60.00 mL/min  Lipid panel  Result Value Ref Range   Cholesterol 233 (H) 0 - 200 mg/dL   Triglycerides 71.0 0.0 - 149.0 mg/dL   HDL 97.20 >39.00 mg/dL   VLDL 14.2 0.0 - 40.0 mg/dL   LDL Cholesterol 122 (H) 0 - 99 mg/dL   Total CHOL/HDL Ratio 2    NonHDL 136.21   Vitamin D (25 hydroxy)  Result Value Ref Range   VITD 42.59 30.00 - 100.00 ng/mL  TSH  Result Value Ref Range   TSH 1.46 0.35 - 4.50 uIU/mL

## 2018-03-27 ENCOUNTER — Encounter: Payer: Self-pay | Admitting: Family Medicine

## 2018-03-27 ENCOUNTER — Ambulatory Visit (INDEPENDENT_AMBULATORY_CARE_PROVIDER_SITE_OTHER): Payer: Medicare Other | Admitting: Family Medicine

## 2018-03-27 VITALS — BP 122/88 | HR 77 | Temp 98.2°F | Resp 16 | Ht 58.5 in | Wt 120.8 lb

## 2018-03-27 DIAGNOSIS — Z23 Encounter for immunization: Secondary | ICD-10-CM | POA: Diagnosis not present

## 2018-03-27 DIAGNOSIS — M810 Age-related osteoporosis without current pathological fracture: Secondary | ICD-10-CM | POA: Diagnosis not present

## 2018-03-27 DIAGNOSIS — J4520 Mild intermittent asthma, uncomplicated: Secondary | ICD-10-CM

## 2018-03-27 DIAGNOSIS — E785 Hyperlipidemia, unspecified: Secondary | ICD-10-CM

## 2018-03-27 DIAGNOSIS — E559 Vitamin D deficiency, unspecified: Secondary | ICD-10-CM | POA: Diagnosis not present

## 2018-03-27 DIAGNOSIS — R5383 Other fatigue: Secondary | ICD-10-CM

## 2018-03-27 DIAGNOSIS — Z5181 Encounter for therapeutic drug level monitoring: Secondary | ICD-10-CM | POA: Diagnosis not present

## 2018-03-27 DIAGNOSIS — S61002A Unspecified open wound of left thumb without damage to nail, initial encounter: Secondary | ICD-10-CM

## 2018-03-27 LAB — LIPID PANEL
CHOLESTEROL: 233 mg/dL — AB (ref 0–200)
HDL: 97.2 mg/dL (ref >39.00)
LDL Cholesterol: 122 mg/dL — ABNORMAL HIGH (ref 0–99)
NonHDL: 136.21
TRIGLYCERIDES: 71 mg/dL (ref 0.0–149.0)
Total CHOL/HDL Ratio: 2
VLDL: 14.2 mg/dL (ref 0.0–40.0)

## 2018-03-27 LAB — CBC
HCT: 40 % (ref 36.0–46.0)
Hemoglobin: 13.3 g/dL (ref 12.0–15.0)
MCHC: 33.3 g/dL (ref 30.0–36.0)
MCV: 90.4 fl (ref 78.0–100.0)
PLATELETS: 277 10*3/uL (ref 150.0–400.0)
RBC: 4.43 Mil/uL (ref 3.87–5.11)
RDW: 12.6 % (ref 11.5–15.5)
WBC: 6.1 10*3/uL (ref 4.0–10.5)

## 2018-03-27 LAB — COMPREHENSIVE METABOLIC PANEL
ALBUMIN: 4 g/dL (ref 3.5–5.2)
ALK PHOS: 75 U/L (ref 39–117)
ALT: 17 U/L (ref 0–35)
AST: 21 U/L (ref 0–37)
BILIRUBIN TOTAL: 0.7 mg/dL (ref 0.2–1.2)
BUN: 14 mg/dL (ref 6–23)
CALCIUM: 10.3 mg/dL (ref 8.4–10.5)
CHLORIDE: 104 meq/L (ref 96–112)
CO2: 27 mEq/L (ref 19–32)
CREATININE: 0.68 mg/dL (ref 0.40–1.20)
GFR: 90 mL/min (ref >60.00)
Glucose, Bld: 93 mg/dL (ref 70–99)
Potassium: 4 mEq/L (ref 3.5–5.1)
Sodium: 139 mEq/L (ref 135–145)
TOTAL PROTEIN: 6.2 g/dL (ref 6.0–8.3)

## 2018-03-27 LAB — VITAMIN D 25 HYDROXY (VIT D DEFICIENCY, FRACTURES): VITD: 42.59 ng/mL (ref 30.00–100.00)

## 2018-03-27 LAB — TSH: TSH: 1.46 u[IU]/mL (ref 0.35–4.50)

## 2018-03-27 NOTE — Patient Instructions (Addendum)
It was good to see you today!   Tetanus booster given today  You might also want to consider having the shingles vaccine (shingrix) given at your drug store at your convenience   [facility]- perhaps see if your mammogram can be done at the same time  We will look for any cause of your fatigue in your labs today. If all ok we might want to do a sleep study to look for sleep apnea for you  Health Maintenance, Female Adopting a healthy lifestyle and getting preventive care can go a long way to promote health and wellness. Talk with your health care provider about what schedule of regular examinations is right for you. This is a good chance for you to check in with your provider about disease prevention and staying healthy. In between checkups, there are plenty of things you can do on your own. Experts have done a lot of research about which lifestyle changes and preventive measures are most likely to keep you healthy. Ask your health care provider for more information. Weight and diet Eat a healthy diet  Be sure to include plenty of vegetables, fruits, low-fat dairy products, and lean protein.  Do not eat a lot of foods high in solid fats, added sugars, or salt.  Get regular exercise. This is one of the most important things you can do for your health. ? Most adults should exercise for at least 150 minutes each week. The exercise should increase your heart rate and make you sweat (moderate-intensity exercise). ? Most adults should also do strengthening exercises at least twice a week. This is in addition to the moderate-intensity exercise.  Maintain a healthy weight  Body mass index (BMI) is a measurement that can be used to identify possible weight problems. It estimates body fat based on height and weight. Your health care provider can help determine your BMI and help you achieve or maintain a healthy weight.  For females 74 years of age and  older: ? A BMI below 18.5 is considered underweight. ? A BMI of 18.5 to 24.9 is normal. ? A BMI of 25 to 29.9 is considered overweight. ? A BMI of 30 and above is considered obese.  Watch levels of cholesterol and blood lipids  You should start having your blood tested for lipids and cholesterol at 73 years of age, then have this test every 5 years.  You may need to have your cholesterol levels checked more often if: ? Your lipid or cholesterol levels are high. ? You are older than 73 years of age. ? You are at high risk for heart disease.  Cancer screening Lung Cancer  Lung cancer screening is recommended for adults 22-64 years old who are at high risk for lung cancer because of a history of smoking.  A yearly low-dose CT scan of the lungs is recommended for people who: ? Currently smoke. ? Have quit within the past 15 years. ? Have at least a 30-pack-year history of smoking. A pack year is smoking an average of one pack of cigarettes a day for 1 year.  Yearly screening should continue until it has been 15 years since you quit.  Yearly screening should stop if you develop a health problem that would prevent you from having lung cancer treatment.  Breast Cancer  Practice breast self-awareness. This means understanding how your breasts normally appear and feel.  It also means doing regular breast  self-exams. Let your health care provider know about any changes, no matter how small.  If you are in your 20s or 30s, you should have a clinical breast exam (CBE) by a health care provider every 1-3 years as part of a regular health exam.  If you are 22 or older, have a CBE every year. Also consider having a breast X-ray (mammogram) every year.  If you have a family history of breast cancer, talk to your health care provider about genetic screening.  If you are at high risk for breast cancer, talk to your health care provider about having an MRI and a mammogram every year.  Breast  cancer gene (BRCA) assessment is recommended for women who have family members with BRCA-related cancers. BRCA-related cancers include: ? Breast. ? Ovarian. ? Tubal. ? Peritoneal cancers.  Results of the assessment will determine the need for genetic counseling and BRCA1 and BRCA2 testing.  Cervical Cancer Your health care provider may recommend that you be screened regularly for cancer of the pelvic organs (ovaries, uterus, and vagina). This screening involves a pelvic examination, including checking for microscopic changes to the surface of your cervix (Pap test). You may be encouraged to have this screening done every 3 years, beginning at age 3.  For women ages 53-65, health care providers may recommend pelvic exams and Pap testing every 3 years, or they may recommend the Pap and pelvic exam, combined with testing for human papilloma virus (HPV), every 5 years. Some types of HPV increase your risk of cervical cancer. Testing for HPV may also be done on women of any age with unclear Pap test results.  Other health care providers may not recommend any screening for nonpregnant women who are considered low risk for pelvic cancer and who do not have symptoms. Ask your health care provider if a screening pelvic exam is right for you.  If you have had past treatment for cervical cancer or a condition that could lead to cancer, you need Pap tests and screening for cancer for at least 20 years after your treatment. If Pap tests have been discontinued, your risk factors (such as having a new sexual partner) need to be reassessed to determine if screening should resume. Some women have medical problems that increase the chance of getting cervical cancer. In these cases, your health care provider may recommend more frequent screening and Pap tests.  Colorectal Cancer  This type of cancer can be detected and often prevented.  Routine colorectal cancer screening usually begins at 73 years of age and  continues through 73 years of age.  Your health care provider may recommend screening at an earlier age if you have risk factors for colon cancer.  Your health care provider may also recommend using home test kits to check for hidden blood in the stool.  A small camera at the end of a tube can be used to examine your colon directly (sigmoidoscopy or colonoscopy). This is done to check for the earliest forms of colorectal cancer.  Routine screening usually begins at age 9.  Direct examination of the colon should be repeated every 5-10 years through 73 years of age. However, you may need to be screened more often if early forms of precancerous polyps or small growths are found.  Skin Cancer  Check your skin from head to toe regularly.  Tell your health care provider about any new moles or changes in moles, especially if there is a change in a mole's shape or  color.  Also tell your health care provider if you have a mole that is larger than the size of a pencil eraser.  Always use sunscreen. Apply sunscreen liberally and repeatedly throughout the day.  Protect yourself by wearing long sleeves, pants, a wide-brimmed hat, and sunglasses whenever you are outside.  Heart disease, diabetes, and high blood pressure  High blood pressure causes heart disease and increases the risk of stroke. High blood pressure is more likely to develop in: ? People who have blood pressure in the high end of the normal range (130-139/85-89 mm Hg). ? People who are overweight or obese. ? People who are African American.  If you are 25-73 years of age, have your blood pressure checked every 3-5 years. If you are 45 years of age or older, have your blood pressure checked every year. You should have your blood pressure measured twice-once when you are at a hospital or clinic, and once when you are not at a hospital or clinic. Record the average of the two measurements. To check your blood pressure when you are not  at a hospital or clinic, you can use: ? An automated blood pressure machine at a pharmacy. ? A home blood pressure monitor.  If you are between 27 years and 51 years old, ask your health care provider if you should take aspirin to prevent strokes.  Have regular diabetes screenings. This involves taking a blood sample to check your fasting blood sugar level. ? If you are at a normal weight and have a low risk for diabetes, have this test once every three years after 73 years of age. ? If you are overweight and have a high risk for diabetes, consider being tested at a younger age or more often. Preventing infection Hepatitis B  If you have a higher risk for hepatitis B, you should be screened for this virus. You are considered at high risk for hepatitis B if: ? You were born in a country where hepatitis B is common. Ask your health care provider which countries are considered high risk. ? Your parents were born in a high-risk country, and you have not been immunized against hepatitis B (hepatitis B vaccine). ? You have HIV or AIDS. ? You use needles to inject street drugs. ? You live with someone who has hepatitis B. ? You have had sex with someone who has hepatitis B. ? You get hemodialysis treatment. ? You take certain medicines for conditions, including cancer, organ transplantation, and autoimmune conditions.  Hepatitis C  Blood testing is recommended for: ? Everyone born from [birth year] through 1966. ? Anyone with known risk factors for hepatitis C.  Sexually transmitted infections (STIs)  You should be screened for sexually transmitted infections (STIs) including gonorrhea and chlamydia if: ? You are sexually active and are younger than 73 years of age. ? You are older than 73 years of age and your health care provider tells you that you are at risk for this type of infection. ? Your sexual activity has changed since you were last screened and you are at an increased risk for chlamydia  or gonorrhea. Ask your health care provider if you are at risk.  If you do not have HIV, but are at risk, it may be recommended that you take a prescription medicine daily to prevent HIV infection. This is called pre-exposure prophylaxis (PrEP). You are considered at risk if: ? You are sexually active and do not regularly use condoms or know the HIV  status of your partner(s). ? You take drugs by injection. ? You are sexually active with a partner who has HIV.  Talk with your health care provider about whether you are at high risk of being infected with HIV. If you choose to begin PrEP, you should first be tested for HIV. You should then be tested every 3 months for as long as you are taking PrEP. Pregnancy  If you are premenopausal and you may become pregnant, ask your health care provider about preconception counseling.  If you may become pregnant, take 400 to 800 micrograms (mcg) of folic acid every day.  If you want to prevent pregnancy, talk to your health care provider about birth control (contraception). Osteoporosis and menopause  Osteoporosis is a disease in which the bones lose minerals and strength with aging. This can result in serious bone fractures. Your risk for osteoporosis can be identified using a bone density scan.  If you are 30 years of age or older, or if you are at risk for osteoporosis and fractures, ask your health care provider if you should be screened.  Ask your health care provider whether you should take a calcium or vitamin D supplement to lower your risk for osteoporosis.  Menopause may have certain physical symptoms and risks.  Hormone replacement therapy may reduce some of these symptoms and risks. Talk to your health care provider about whether hormone replacement therapy is right for you. Follow these instructions at home:  Schedule regular health, dental, and eye exams.  Stay current with your immunizations.  Do not use any tobacco products  including cigarettes, chewing tobacco, or electronic cigarettes.  If you are pregnant, do not drink alcohol.  If you are breastfeeding, limit how much and how often you drink alcohol.  Limit alcohol intake to no more than 1 drink per day for nonpregnant women. One drink equals 12 ounces of beer, 5 ounces of wine, or 1 ounces of hard liquor.  Do not use street drugs.  Do not share needles.  Ask your health care provider for help if you need support or information about quitting drugs.  Tell your health care provider if you often feel depressed.  Tell your health care provider if you have ever been abused or do not feel safe at home. This information is not intended to replace advice given to you by your health care provider. Make sure you discuss any questions you have with your health care provider. Document Released: 12/07/2010 Document Revised: 10/30/2015 Document Reviewed: 02/25/2015 Elsevier Interactive Patient Education  [employer].

## 2018-03-31 ENCOUNTER — Other Ambulatory Visit: Payer: Self-pay | Admitting: Family Medicine

## 2018-03-31 DIAGNOSIS — Z1231 Encounter for screening mammogram for malignant neoplasm of breast: Secondary | ICD-10-CM

## 2018-04-20 ENCOUNTER — Ambulatory Visit: Payer: Medicare Other

## 2018-04-27 ENCOUNTER — Ambulatory Visit
Admission: RE | Admit: 2018-04-27 | Discharge: 2018-04-27 | Disposition: A | Discharge status: Home / Self Care | Payer: Medicare Other | Source: Ambulatory Visit | Attending: Family Medicine | Admitting: Family Medicine

## 2018-04-27 DIAGNOSIS — Z1231 Encounter for screening mammogram for malignant neoplasm of breast: Secondary | ICD-10-CM

## 2018-08-31 ENCOUNTER — Other Ambulatory Visit: Payer: Self-pay | Admitting: Family Medicine

## 2018-09-01 MED ORDER — MONTELUKAST SODIUM 10 MG PO TABS: 10.0000 mg | ORAL_TABLET | Freq: Every day | ORAL | 1 refills | Status: DC

## 2018-09-01 MED ORDER — FLUTICASONE-SALMETEROL 250-50 MCG/DOSE IN AEPB: INHALATION_SPRAY | RESPIRATORY_TRACT | 4 refills | Status: DC

## 2018-09-13 ENCOUNTER — Other Ambulatory Visit: Payer: Medicare Other

## 2018-09-21 IMAGING — CT CT ABD-PELV W/ CM
2 of 5 series · 15 of 46 positions shown, 17 images · IV contrast (APPLIED)
Comparison: None available.

CLINICAL DATA: Initial evaluation for abdominal distension,
fullness after meals.

EXAM:
CT ABDOMEN AND PELVIS WITH CONTRAST
TECHNIQUE: Multidetector CT imaging of the abdomen and pelvis was performed
using the standard protocol following bolus administration of
intravenous contrast.
CONTRAST:  100mL [id number] IOPAMIDOL ([id number]) INJECTION 61%

[Series 2: axial st · axial · 0.75mm/px · z∈[-409,-69]mm · 12 of 77 slices shown, 14 images]
[im 5/77  soft-tissue]
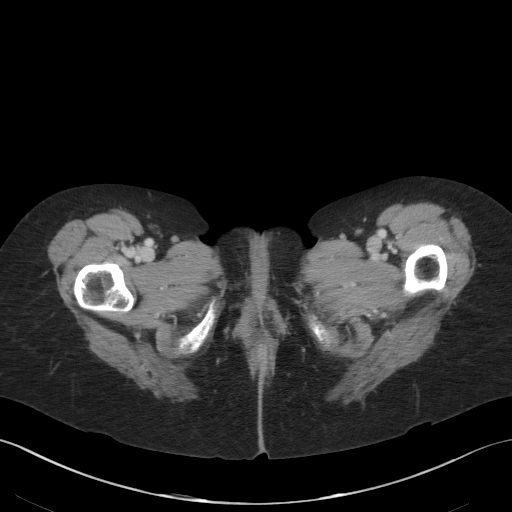
[im 5/77  bone]
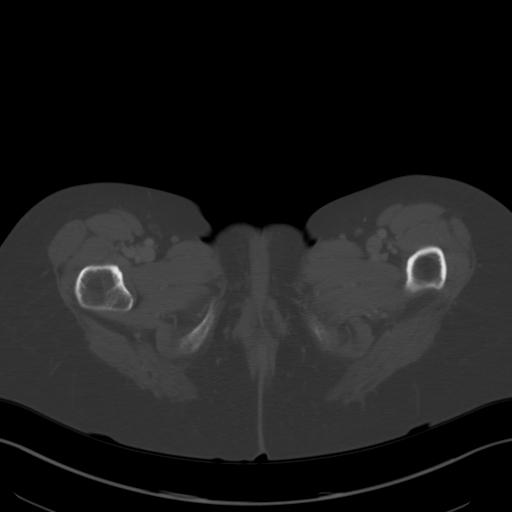
[im 13/77  soft-tissue]
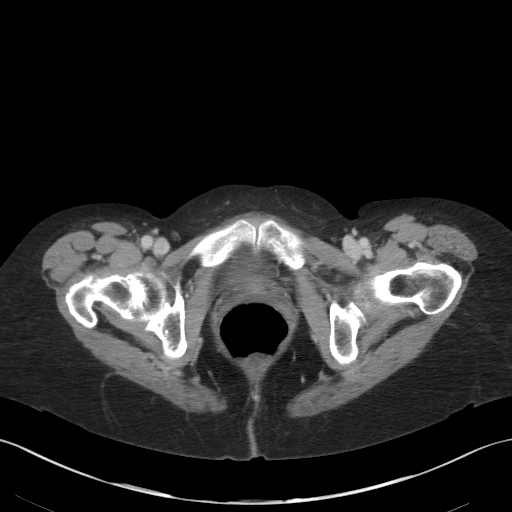
[im 17/77  soft-tissue]
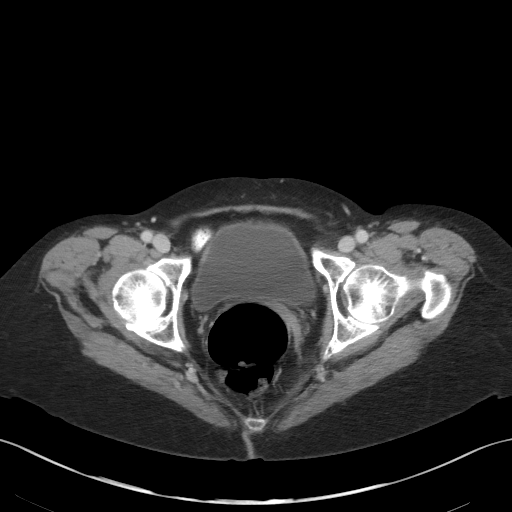
[im 25/77  soft-tissue]
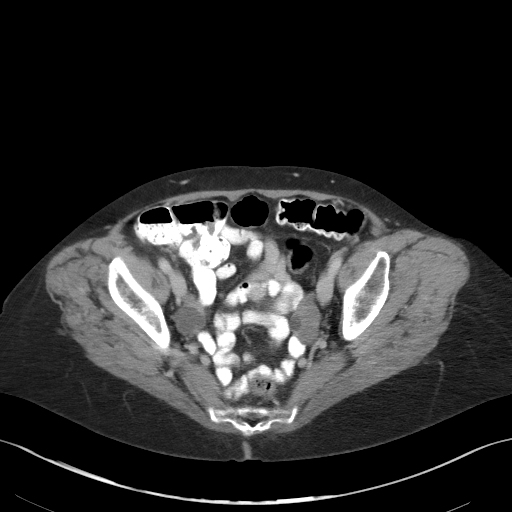
[im 29/77  soft-tissue]
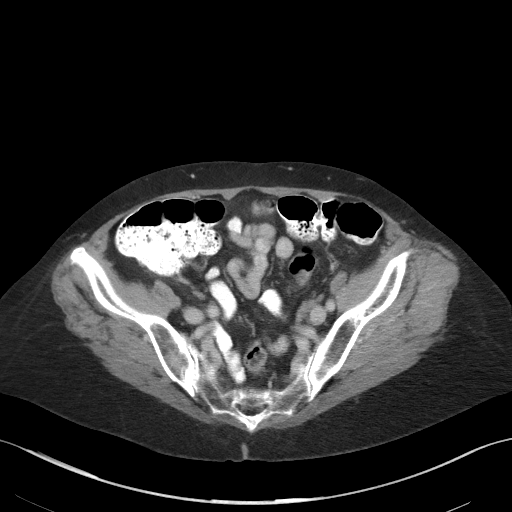
[im 37/77  soft-tissue]
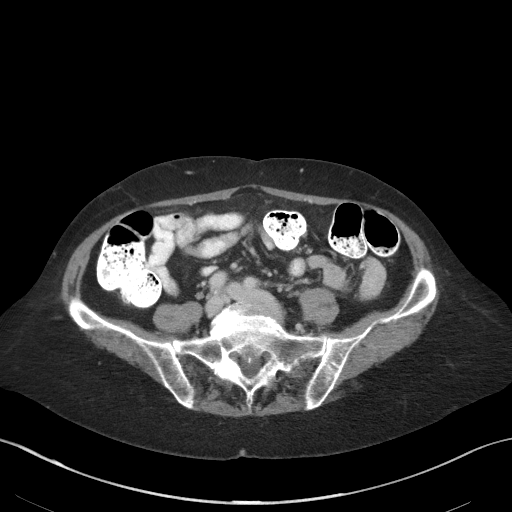
[im 41/77  soft-tissue]
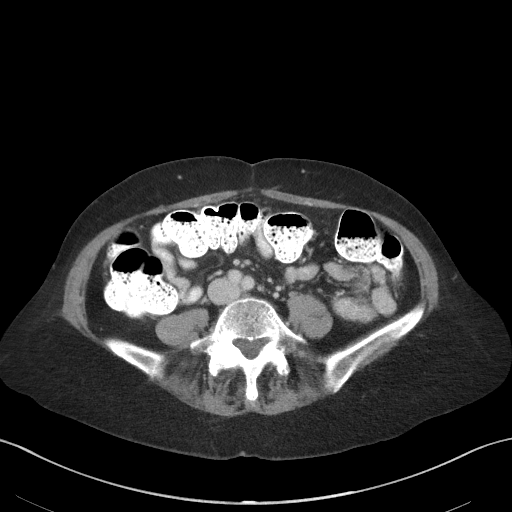
[im 49/77  soft-tissue]
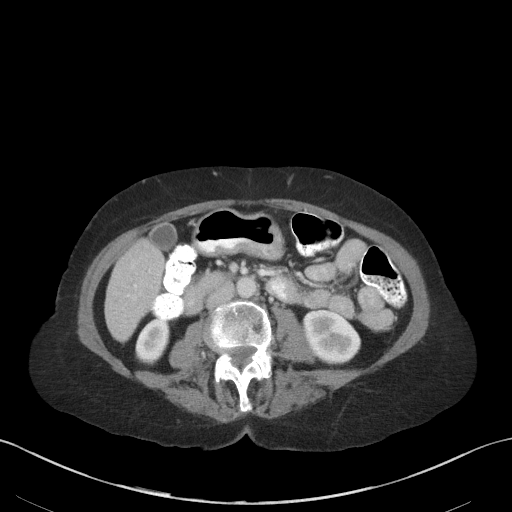
[im 53/77  soft-tissue]
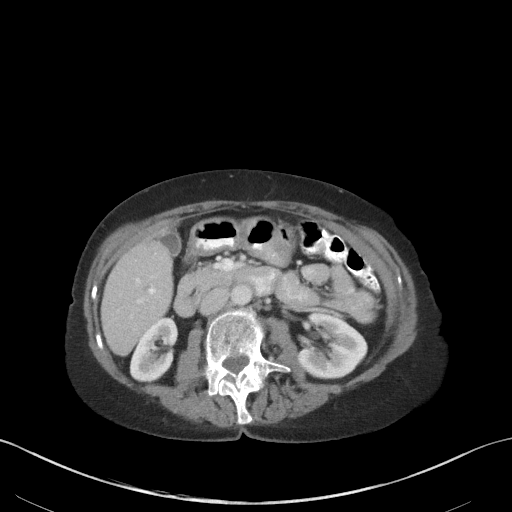
[im 53/77  bone]
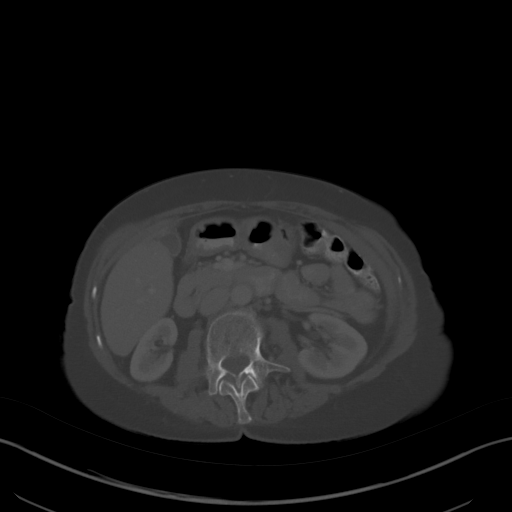
[im 61/77  soft-tissue]
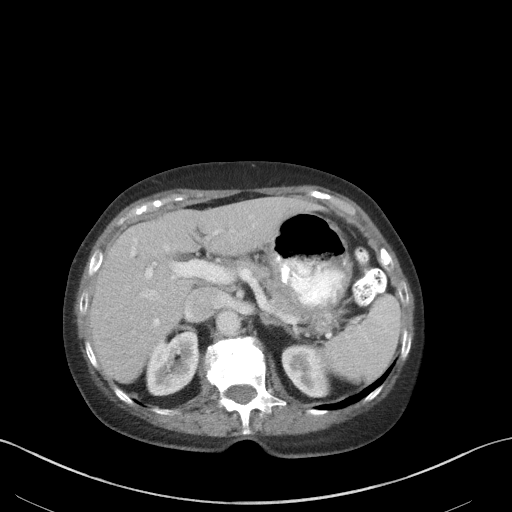
[im 65/77  soft-tissue]
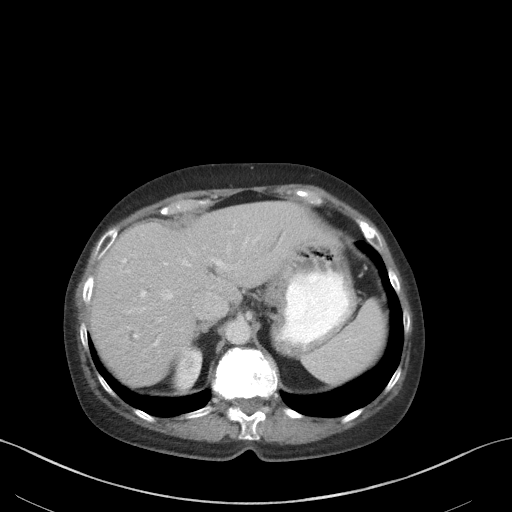
[im 73/77  soft-tissue]
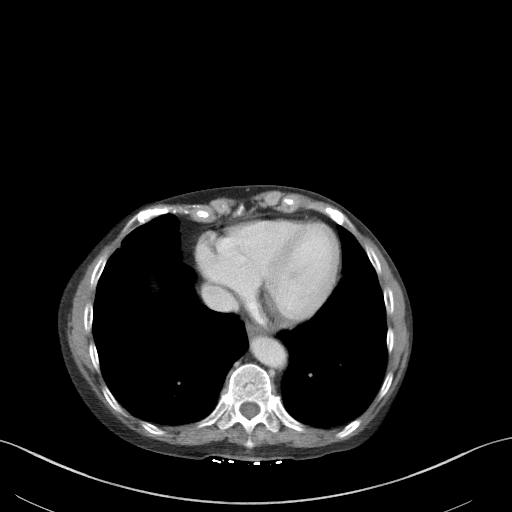

[Series 5: coronal st · coronal · 0.75mm/px · 3 of 70 slices shown]
[im 24/70  soft-tissue]
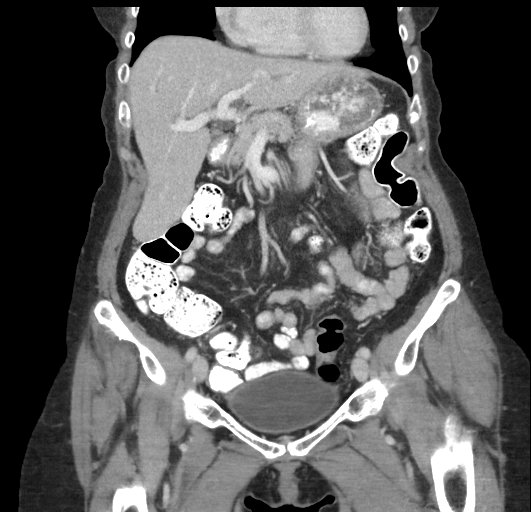
[im 31/70  soft-tissue]
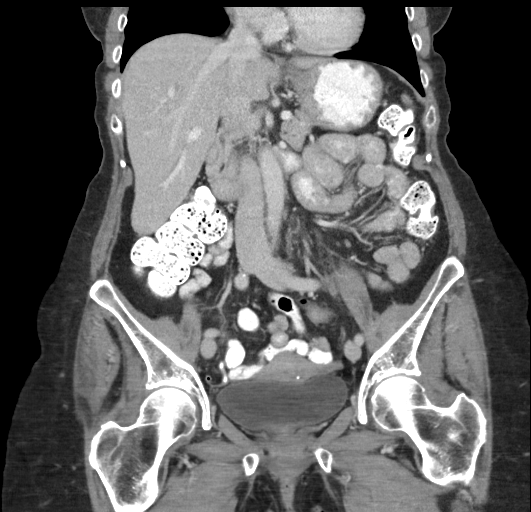
[im 39/70  soft-tissue]
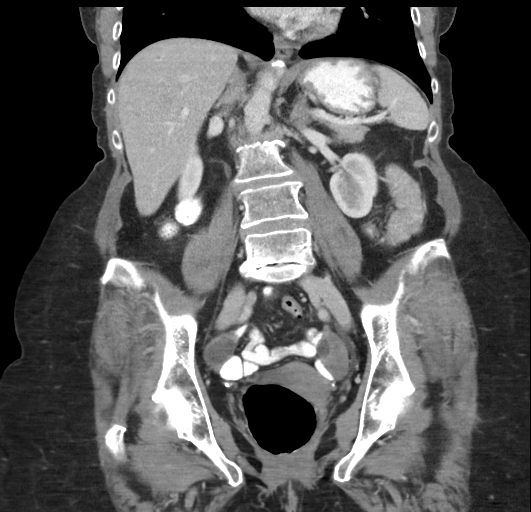

[15 of 46 positions shown; findings below may reference images not displayed]

FINDINGS: Lower chest: Visualized lung bases are clear.

Hepatobiliary: 17 mm heterogeneous Dieujuste enhancing lesion at the
posterior right hepatic lobe noted (series 2, image 14), most
consistent with a benign hemangioma. Liver otherwise demonstrates a
normal contrast enhanced appearance. Gallbladder within normal
limits. Common bile duct within normal limits measuring up to 5 mm.
Mild intrahepatic biliary dilatation noted within the left hepatic
lobe, of uncertain significance.

Pancreas: Pancreas within normal limits without mass lesion or acute
inflammatory changes. Pancreatic duct measures within normal limits
at 2 mm.

Spleen: Spleen within normal limits.

Adrenals/Urinary Tract: Adrenal glands are normal.

Kidneys equal in size with symmetric enhancement. Punctate 2 mm
nonobstructive stone present within the interpolar left kidney. 3 mm
nonobstructive stone present within the interpolar right kidney. No
hydronephrosis. No focal enhancing renal mass. No hydroureter.
Bladder within normal limits.

Stomach/Bowel: Mild diffuse wall thickening seen at the level of the
gastric antrum (series 2, image 27). Finding is nonspecific, but
could reflect sequelae of underlying gastritis. No hiatal hernia.
Small bowel of normal caliber and appearance without evidence for
obstruction or acute inflammation. Appendix well visualized within
the right lower quadrant and is of normal caliber and appearance
without associated inflammatory changes to suggest acute
appendicitis. No abnormal wall thickening, mucosal enhancement, or
inflammatory fat stranding seen elsewhere about the bowels. Oral
contrast material reaches the distal descending colon.

Vascular/Lymphatic: Normal intravascular enhancement seen throughout
the intra- abdominal aorta and its branch vessels. No aneurysm. No
pathologically enlarged intra-abdominal or pelvic lymph nodes
identified.

Reproductive: Few scattered calcific densities noted within the
uterus. Uterus otherwise unremarkable. 11 mm cyst present within the
left ovary. 2.5 cm cyst present within the right ovary. Ovaries
otherwise unremarkable.

Other: No free air or fluid. Small fat containing paraumbilical
hernia noted.

Musculoskeletal: No acute osseus abnormality. No worrisome lytic or
blastic osseous lesions. Grade 1 anterolisthesis of L5 on S1.
Dextroscoliosis of the lumbar spine with apex at L2-3.
IMPRESSION: 1. Mild wall thickening about the gastric antrum. Finding is
nonspecific, but could reflect the sequelae of underlying gastritis.
Clinical correlation recommended.
2. Mild intrahepatic biliary dilatation within the left hepatic
lobe, of uncertain etiology or significance. Correlation with LFTs
recommended. No extrahepatic biliary dilatation or other acute
biliary pathology identified.
3. Bilateral nonobstructive nephrolithiasis.
4. 1.7 cm benign hemangioma within the right hepatic lobe.
5. Bilateral ovarian cysts measuring up to it 2.5 cm as above. These
are almost certainly benign based on size and appearance, with no
follow-up imaging recommended.
6. Dextroscoliosis with multilevel degenerative spondylolysis of the
lumbar spine. Grade 1 anterolisthesis of L5 on S1.

## 2018-11-15 ENCOUNTER — Encounter: Payer: Self-pay | Admitting: Family Medicine

## 2018-11-15 ENCOUNTER — Other Ambulatory Visit: Payer: Self-pay

## 2018-11-15 ENCOUNTER — Ambulatory Visit
Admission: RE | Admit: 2018-11-15 | Discharge: 2018-11-15 | Disposition: A | Discharge status: Home / Self Care | Payer: Medicare Other | Source: Ambulatory Visit | Attending: Family Medicine | Admitting: Family Medicine

## 2018-11-15 DIAGNOSIS — M810 Age-related osteoporosis without current pathological fracture: Secondary | ICD-10-CM

## 2019-01-08 ENCOUNTER — Other Ambulatory Visit: Payer: Self-pay

## 2019-01-14 ENCOUNTER — Other Ambulatory Visit: Payer: Self-pay | Admitting: Family Medicine

## 2019-02-01 ENCOUNTER — Encounter: Payer: Self-pay | Admitting: *Deleted

## 2019-02-18 ENCOUNTER — Other Ambulatory Visit: Payer: Self-pay | Admitting: Family Medicine

## 2019-04-17 ENCOUNTER — Other Ambulatory Visit: Payer: Self-pay | Admitting: Family Medicine

## 2019-05-10 ENCOUNTER — Other Ambulatory Visit: Payer: Self-pay | Admitting: Family Medicine

## 2019-05-23 NOTE — Progress Notes (Addendum)
[facility] [address], [place], [place] [address] [phone]  Date:  05/24/2019   Name:  Alyssa Nielsen   DOB:  1945/09/10   MRN:  [id number]  PCP:  Darreld Mclean, MD    Chief Complaint: Annual Exam   History of Present Illness:  Alyssa Nielsen is a 73 y.o. very pleasant female patient who presents with the following:  Here today for periodic physical exam/wellness visit-Medicare History of osteoporosis, vitamin D deficiency, hypertension, arthritis, breast cancer Last seen by myself about 1 year ago  She is very slight in build, has lost height and failed to respond well to Fosamax in the past She is not interested in other treatments right now either   Mammogram due- she will schedule with the breast center  Immunizations up-to-date, except second dose of Shingrix not done yet  Colonoscopy 2018 DEXA scan June 2020, osteoporosis  Patient Active Problem List   Diagnosis Date Noted  . History of artificial joint 01/01/2014  . Osteoarthritis of left knee 12/26/2013  . Asthma 03/14/2013  . OSTEOPOROSIS 06/22/2011  . Psoriasis 06/22/2011  . Lower back pain 06/22/2011  . Vitamin D deficiency 06/22/2011  . DEPRESSION 06/30/2007  . Essential hypertension 06/30/2007  . ARTHRITIS 06/30/2007  . ALLERGIC RHINITIS 01/19/2007  . GERD 01/19/2007  . BREAST CANCER, HX OF 01/19/2007  . ESOPHAGEAL STRICTURE 08/24/2004  . DIVERTICULOSIS OF COLON 07/28/2004    Past Medical History:  Diagnosis Date  . Allergy   . Anxiety   . Arthritis 2011 and 2013   spine and L knee  . Asthma   . Cancer [facility])    breast cancer  . Chicken pox   . GERD (gastroesophageal reflux disease)   . Glaucoma   . Hay fever   . Headache(784.0)   . Hyperlipidemia   . Osteoporosis   . Personal history of urinary (tract) infections   . Ulcer    STOMACH    Past Surgical History:  Procedure Laterality Date  . BREAST EXCISIONAL  BIOPSY Left 1996  . BREAST EXCISIONAL BIOPSY Right 1974  . BREAST LUMPECTOMY Right 1999   malignant  . BREAST SURGERY  1999  1996   R BREAST (CANCEROUS)  L BREAST (BENIGN)     . MOUTH SURGERY     WISDOM TEETH REMOVAL  . PARTIAL KNEE ARTHROPLASTY Left 12/26/2013   Procedure: UNICOMPARTMENTAL KNEE;  Surgeon: Vickey Huger, MD;  Location: [facility];  Service: Orthopedics;  Laterality: Left;  . TONSILLECTOMY  1954    Social History   Tobacco Use  . Smoking status: Former Smoker    Years: 2.00    Types: Cigarettes    Quit date: 06/07/1973    Years since quitting: 45.9  . Smokeless tobacco: Never Used  Substance Use Topics  . Alcohol use: No  . Drug use: No    Family History  Problem Relation Age of Onset  . Hypertension Mother   . Heart disease Mother   . Stroke Mother        HEMORRAGHIC  . Cancer Father 18       PANCREATIC  . Hypertension Father   . Hypertension Sister   . Thyroid disease Sister   . Stroke Sister   . Hypertension Brother   . Colon cancer Cousin     Allergies  Allergen Reactions  . Nsaids Other (See Comments)    Pt stated they make her stomach  burn  . Aspirin Other (See Comments)    GI UPSET-STOMACH BURNS  . Ibuprofen Other (See Comments)    STOMACH BURNS SLIGHTLY    Medication list has been reviewed and updated.  Current Outpatient Medications on File Prior to Visit  Medication Sig Dispense Refill  . acetaminophen (TYLENOL) 500 MG tablet Take 500 mg by mouth every 8 (eight) hours as needed for moderate pain.    Marland Kitchen aluminum & magnesium hydroxide-simethicone (MYLANTA) 500-450-40 MG/5ML suspension Take by mouth every 6 (six) hours as needed for indigestion.    . brimonidine (ALPHAGAN) 0.2 % ophthalmic solution Place 1 drop into the left eye 3 (three) times daily.    . Calcium Citrate-Vitamin D (CITRACAL + D PO) Take 1 tablet by mouth daily.    . fluticasone (FLONASE) 50 MCG/ACT nasal spray PLACE 2 SPRAYS INTO BOTH NOSTRILS DAILY. 16 g 11  . ketorolac  (ACULAR) 0.5 % ophthalmic solution INSTILL 1 DROP INTO BOTH EYES TWICE A DAY  6  . Ketotifen Fumarate (ALAWAY OP) Place 1 drop into both eyes daily as needed (for allergies / itching).    . Latanoprostene Bunod (VYZULTA) 0.024 % SOLN Apply 1 drop to eye at bedtime.    Marland Kitchen loratadine (CLARITIN) 10 MG tablet Take by mouth.    . montelukast (SINGULAIR) 10 MG tablet TAKE 1 TABLET BY MOUTH EVERYDAY AT BEDTIME 30 tablet 0  . niacin 500 MG tablet Take 500 mg by mouth daily.    Vladimir Faster Glycol-Propyl Glycol (SYSTANE OP) Place 1 drop into both eyes daily as needed (for dry eyes).    Grant Ruts INHUB 250-50 MCG/DOSE AEPB INHALE 2 PUFFS EVERY DAY RINSE MOUTH AFTER USE 30 each 0   No current facility-administered medications on file prior to visit.    Review of Systems:  As per HPI- otherwise negative. Her asthma is well controlled with her current medications She rarely if ever has any wheezing   Physical Examination: Vitals:   05/24/19 1356  BP: (!) 151/88  Pulse: 95  Resp: 16  Temp: (!) 96.8 F (36 C)  SpO2: 98%   Vitals:   05/24/19 1356  Weight: 117 lb (53.1 kg)  Height: 4\' 11"  (1.499 m)   Body mass index is 23.63 kg/m. Ideal Body Weight: Weight in (lb) to have BMI = 25: 123.5  GEN: WDWN, NAD, Non-toxic, A & O x 3, petite build, looks well HEENT: Atraumatic, Normocephalic. Neck supple. No masses, No LAD. Ears and Nose: No external deformity. CV: RRR, No M/G/R. No JVD. No thrill. No extra heart sounds. PULM: CTA B, no wheezes, crackles, rhonchi. No retractions. No resp. distress. No accessory muscle use. ABD: S, NT, ND, +BS. No rebound. No HSM. EXTR: No c/c/e NEURO Normal gait.  PSYCH: Normally interactive. Conversant. Not depressed or anxious appearing.  Calm demeanor.    Assessment and Plan: Age-related osteoporosis without current pathological fracture  Dyslipidemia - Plan: Lipid panel  Vitamin D deficiency - Plan: Vitamin D (25 hydroxy)  Mild intermittent asthma  without complication - Plan: montelukast (SINGULAIR) 10 MG tablet, Fluticasone-Salmeterol (WIXELA INHUB) 250-50 MCG/DOSE AEPB  Medication monitoring encounter  Essential hypertension - Plan: CBC, Comprehensive metabolic panel  Here today for a routine follow-up visit; not CPE as pt has medicare Mild asthma sx are well controlled BP ok Suggested 2nd shingles vaccine and mammogram Will plan further follow- up pending labs.  This visit occurred during the SARS-CoV-2 public health emergency.  Safety protocols were in place, including screening questions  prior to the visit, additional usage of staff PPE, and extensive cleaning of exam room while observing appropriate contact time as indicated for disinfecting solutions.    Signed Lamar Blinks, MD  Received her labs, letter to pt  Results for orders placed or performed in visit on 05/24/19  CBC  Result Value Ref Range   WBC 5.5 4.0 - 10.5 K/uL   RBC 4.37 3.87 - 5.11 Mil/uL   Platelets 283.0 150.0 - 400.0 K/uL   Hemoglobin 13.2 12.0 - 15.0 g/dL   HCT 39.9 36.0 - 46.0 %   MCV 91.3 78.0 - 100.0 fl   MCHC 33.0 30.0 - 36.0 g/dL   RDW 12.6 11.5 - 15.5 %  Comprehensive metabolic panel  Result Value Ref Range   Sodium 140 135 - 145 mEq/L   Potassium 4.1 3.5 - 5.1 mEq/L   Chloride 104 96 - 112 mEq/L   CO2 29 19 - 32 mEq/L   Glucose, Bld 97 70 - 99 mg/dL   BUN 15 6 - 23 mg/dL   Creatinine, Ser 0.70 0.40 - 1.20 mg/dL   Total Bilirubin 0.5 0.2 - 1.2 mg/dL   Alkaline Phosphatase 84 39 - 117 U/L   AST 20 0 - 37 U/L   ALT 16 0 - 35 U/L   Total Protein 6.5 6.0 - 8.3 g/dL   Albumin 4.1 3.5 - 5.2 g/dL   GFR 81.63 >60.00 mL/min   Calcium 10.0 8.4 - 10.5 mg/dL  Lipid panel  Result Value Ref Range   Cholesterol 237 (H) 0 - 200 mg/dL   Triglycerides 78.0 0.0 - 149.0 mg/dL   HDL 108.30 >39.00 mg/dL   VLDL 15.6 0.0 - 40.0 mg/dL   LDL Cholesterol 113 (H) 0 - 99 mg/dL   Total CHOL/HDL Ratio 2    NonHDL 128.92   Vitamin D (25 hydroxy)   Result Value Ref Range   VITD 42.78 30.00 - 100.00 ng/mL

## 2019-05-24 ENCOUNTER — Ambulatory Visit (INDEPENDENT_AMBULATORY_CARE_PROVIDER_SITE_OTHER): Payer: Medicare Other | Admitting: Family Medicine

## 2019-05-24 ENCOUNTER — Encounter: Payer: Self-pay | Admitting: Family Medicine

## 2019-05-24 ENCOUNTER — Other Ambulatory Visit: Payer: Self-pay

## 2019-05-24 VITALS — BP 138/85 | HR 95 | Temp 96.8°F | Resp 16 | Ht 59.0 in | Wt 117.0 lb

## 2019-05-24 DIAGNOSIS — I10 Essential (primary) hypertension: Secondary | ICD-10-CM | POA: Diagnosis not present

## 2019-05-24 DIAGNOSIS — E559 Vitamin D deficiency, unspecified: Secondary | ICD-10-CM

## 2019-05-24 DIAGNOSIS — Z5181 Encounter for therapeutic drug level monitoring: Secondary | ICD-10-CM

## 2019-05-24 DIAGNOSIS — E785 Hyperlipidemia, unspecified: Secondary | ICD-10-CM

## 2019-05-24 DIAGNOSIS — J4520 Mild intermittent asthma, uncomplicated: Secondary | ICD-10-CM

## 2019-05-24 DIAGNOSIS — M810 Age-related osteoporosis without current pathological fracture: Secondary | ICD-10-CM

## 2019-05-24 MED ORDER — MONTELUKAST SODIUM 10 MG PO TABS: ORAL_TABLET | ORAL | 3 refills | Status: DC

## 2019-05-24 MED ORDER — FLUTICASONE-SALMETEROL 250-50 MCG/DOSE IN AEPB: INHALATION_SPRAY | RESPIRATORY_TRACT | 3 refills | Status: DC

## 2019-05-24 NOTE — Patient Instructions (Signed)
It was great to see you again today Please schedule your mammogram, and have your 2nd shingles vaccine at your convenience  I will be in touch with your labs   Health Maintenance After Age 74 After age 21, you are at a higher risk for certain long-term diseases and infections as well as injuries from falls. Falls are a major cause of broken bones and head injuries in people who are older than age 38. Getting regular preventive care can help to keep you healthy and well. Preventive care includes getting regular testing and making lifestyle changes as recommended by your health care provider. Talk with your health care provider about:  Which screenings and tests you should have. A screening is a test that checks for a disease when you have no symptoms.  A diet and exercise plan that is right for you. What should I know about screenings and tests to prevent falls? Screening and testing are the best ways to find a health problem early. Early diagnosis and treatment give you the best chance of managing medical conditions that are common after age 41. Certain conditions and lifestyle choices may make you more likely to have a fall. Your health care provider may recommend:  Regular vision checks. Poor vision and conditions such as cataracts can make you more likely to have a fall. If you wear glasses, make sure to get your prescription updated if your vision changes.  Medicine review. Work with your health care provider to regularly review all of the medicines you are taking, including over-the-counter medicines. Ask your health care provider about any side effects that may make you more likely to have a fall. Tell your health care provider if any medicines that you take make you feel dizzy or sleepy.  Osteoporosis screening. Osteoporosis is a condition that causes the bones to get weaker. This can make the bones weak and cause them to break more easily.  Blood pressure screening. Blood pressure  changes and medicines to control blood pressure can make you feel dizzy.  Strength and balance checks. Your health care provider may recommend certain tests to check your strength and balance while standing, walking, or changing positions.  Foot health exam. Foot pain and numbness, as well as not wearing proper footwear, can make you more likely to have a fall.  Depression screening. You may be more likely to have a fall if you have a fear of falling, feel emotionally low, or feel unable to do activities that you used to do.  Alcohol use screening. Using too much alcohol can affect your balance and may make you more likely to have a fall. What actions can I take to lower my risk of falls? General instructions  Talk with your health care provider about your risks for falling. Tell your health care provider if: ? You fall. Be sure to tell your health care provider about all falls, even ones that seem minor. ? You feel dizzy, sleepy, or off-balance.  Take over-the-counter and prescription medicines only as told by your health care provider. These include any supplements.  Eat a healthy diet and maintain a healthy weight. A healthy diet includes low-fat dairy products, low-fat (lean) meats, and fiber from whole grains, beans, and lots of fruits and vegetables. Home safety  Remove any tripping hazards, such as rugs, cords, and clutter.  Install safety equipment such as grab bars in bathrooms and safety rails on stairs.  Keep rooms and walkways well-lit. Activity   Follow a regular  exercise program to stay fit. This will help you maintain your balance. Ask your health care provider what types of exercise are appropriate for you.  If you need a cane or walker, use it as recommended by your health care provider.  Wear supportive shoes that have nonskid soles. Lifestyle  Do not drink alcohol if your health care provider tells you not to drink.  If you drink alcohol, limit how much you  have: ? 0-1 drink a day for women. ? 0-2 drinks a day for men.  Be aware of how much alcohol is in your drink. In the [place] one drink equals one typical bottle of beer (12 oz), one-half glass of wine (5 oz), or one shot of hard liquor (1 oz).  Do not use any products that contain nicotine or tobacco, such as cigarettes and e-cigarettes. If you need help quitting, ask your health care provider. Summary  Having a healthy lifestyle and getting preventive care can help to protect your health and wellness after age 15.  Screening and testing are the best way to find a health problem early and help you avoid having a fall. Early diagnosis and treatment give you the best chance for managing medical conditions that are more common for people who are older than age 38.  Falls are a major cause of broken bones and head injuries in people who are older than age 36. Take precautions to prevent a fall at home.  Work with your health care provider to learn what changes you can make to improve your health and wellness and to prevent falls. This information is not intended to replace advice given to you by your health care provider. Make sure you discuss any questions you have with your health care provider. Document Released: 04/06/2017 Document Revised: 09/14/2018 Document Reviewed: 04/06/2017 Elsevier Patient Education  2020 [employer].

## 2019-05-25 LAB — CBC
HCT: 39.9 % (ref 36.0–46.0)
Hemoglobin: 13.2 g/dL (ref 12.0–15.0)
MCHC: 33 g/dL (ref 30.0–36.0)
MCV: 91.3 fl (ref 78.0–100.0)
Platelets: 283 10*3/uL (ref 150.0–400.0)
RBC: 4.37 Mil/uL (ref 3.87–5.11)
RDW: 12.6 % (ref 11.5–15.5)
WBC: 5.5 10*3/uL (ref 4.0–10.5)

## 2019-05-25 LAB — COMPREHENSIVE METABOLIC PANEL
ALT: 16 U/L (ref 0–35)
AST: 20 U/L (ref 0–37)
Albumin: 4.1 g/dL (ref 3.5–5.2)
Alkaline Phosphatase: 84 U/L (ref 39–117)
BUN: 15 mg/dL (ref 6–23)
CO2: 29 mEq/L (ref 19–32)
Calcium: 10 mg/dL (ref 8.4–10.5)
Chloride: 104 mEq/L (ref 96–112)
Creatinine, Ser: 0.7 mg/dL (ref 0.40–1.20)
GFR: 81.63 mL/min (ref >60.00)
Glucose, Bld: 97 mg/dL (ref 70–99)
Potassium: 4.1 mEq/L (ref 3.5–5.1)
Sodium: 140 mEq/L (ref 135–145)
Total Bilirubin: 0.5 mg/dL (ref 0.2–1.2)
Total Protein: 6.5 g/dL (ref 6.0–8.3)

## 2019-05-25 LAB — LIPID PANEL
Cholesterol: 237 mg/dL — ABNORMAL HIGH (ref 0–200)
HDL: 108.3 mg/dL (ref >39.00)
LDL Cholesterol: 113 mg/dL — ABNORMAL HIGH (ref 0–99)
NonHDL: 128.92
Total CHOL/HDL Ratio: 2
Triglycerides: 78 mg/dL (ref 0.0–149.0)
VLDL: 15.6 mg/dL (ref 0.0–40.0)

## 2019-05-25 LAB — VITAMIN D 25 HYDROXY (VIT D DEFICIENCY, FRACTURES): VITD: 42.78 ng/mL (ref 30.00–100.00)

## 2020-05-16 ENCOUNTER — Other Ambulatory Visit: Payer: Self-pay | Admitting: Family Medicine

## 2020-05-16 DIAGNOSIS — I10 Essential (primary) hypertension: Secondary | ICD-10-CM

## 2020-05-16 DIAGNOSIS — M810 Age-related osteoporosis without current pathological fracture: Secondary | ICD-10-CM

## 2020-05-16 DIAGNOSIS — J4520 Mild intermittent asthma, uncomplicated: Secondary | ICD-10-CM

## 2020-05-29 ENCOUNTER — Other Ambulatory Visit: Payer: Self-pay | Admitting: Family Medicine

## 2020-05-29 DIAGNOSIS — J4520 Mild intermittent asthma, uncomplicated: Secondary | ICD-10-CM

## 2020-06-22 ENCOUNTER — Other Ambulatory Visit: Payer: Self-pay | Admitting: Family Medicine

## 2020-06-22 DIAGNOSIS — J4520 Mild intermittent asthma, uncomplicated: Secondary | ICD-10-CM

## 2020-07-17 ENCOUNTER — Other Ambulatory Visit: Payer: Self-pay | Admitting: Family Medicine

## 2020-07-17 DIAGNOSIS — J4520 Mild intermittent asthma, uncomplicated: Secondary | ICD-10-CM

## 2020-07-17 NOTE — Telephone Encounter (Signed)
Okay to refuse rx? Last seen 2020 no pending appointments.

## 2020-12-04 ENCOUNTER — Ambulatory Visit (INDEPENDENT_AMBULATORY_CARE_PROVIDER_SITE_OTHER): Payer: Medicare Other | Admitting: Family Medicine

## 2020-12-04 ENCOUNTER — Ambulatory Visit (HOSPITAL_BASED_OUTPATIENT_CLINIC_OR_DEPARTMENT_OTHER)
Admission: RE | Admit: 2020-12-04 | Discharge: 2020-12-04 | Disposition: A | Discharge status: Home / Self Care | Payer: Medicare Other | Source: Ambulatory Visit | Attending: Family Medicine | Admitting: Family Medicine

## 2020-12-04 ENCOUNTER — Encounter: Payer: Self-pay | Admitting: Family Medicine

## 2020-12-04 ENCOUNTER — Other Ambulatory Visit: Payer: Self-pay

## 2020-12-04 VITALS — BP 134/80 | HR 96 | Temp 98.8°F | Resp 18 | Ht 59.0 in | Wt 117.2 lb

## 2020-12-04 DIAGNOSIS — M25561 Pain in right knee: Secondary | ICD-10-CM

## 2020-12-04 HISTORY — DX: Pain in right knee: M25.561

## 2020-12-04 MED ORDER — PREDNISONE 10 MG PO TABS: ORAL_TABLET | ORAL | 0 refills | Status: DC

## 2020-12-04 NOTE — Patient Instructions (Addendum)
Voltaren Gel-- Otc      Acute Knee Pain, Adult Acute knee pain is sudden and may be caused by damage, swelling, or irritation of the muscles and tissues that support the knee. Pain may result from: A fall. An injury to the knee from twisting motions. A hit to the knee. Infection. Acute knee pain may go away on its own with time and rest. If it does not, your health care provider may order tests to find the cause of the pain. These may include: Imaging tests, such as an X-ray, MRI, CT scan, or ultrasound. Joint aspiration. In this test, fluid is removed from the knee and evaluated. Arthroscopy. In this test, a lighted tube is inserted into the knee and an image is projected onto a TV screen. Biopsy. In this test, a sample of tissue is removed from the body and studied under a microscope. Follow these instructions at home: If you have a knee sleeve or brace:  Wear the knee sleeve or brace as told by your health care provider. Remove it only as told by your health care provider. Loosen it if your toes tingle, become numb, or turn cold and blue. Keep it clean. If the knee sleeve or brace is not waterproof: Do not let it get wet. Cover it with a watertight covering when you take a bath or shower.  Activity Rest your knee. Do not do things that cause pain or make pain worse. Avoid high-impact activities or exercises, such as running, jumping rope, or doing jumping jacks. Work with a physical therapist to make a safe exercise program, as recommended by your health care provider. Do exercises as told by your physical therapist. Managing pain, stiffness, and swelling  If directed, put ice on the affected knee. To do this: If you have a removable knee sleeve or brace, remove it as told by your health care provider. Put ice in a plastic bag. Place a towel between your skin and the bag. Leave the ice on for 20 minutes, 2-3 times a day. Remove the ice if your skin turns bright red. This is  very important. If you cannot feel pain, heat, or cold, you have a greater risk of damage to the area. If directed, use an elastic bandage to put pressure (compression) on your injured knee. This may control swelling, give support, and help with discomfort. Raise (elevate) your knee above the level of your heart while you are sitting or lying down. Sleep with a pillow under your knee.  General instructions Take over-the-counter and prescription medicines only as told by your health care provider. Do not use any products that contain nicotine or tobacco, such as cigarettes, e-cigarettes, and chewing tobacco. If you need help quitting, ask your health care provider. If you are overweight, work with your health care provider and a dietitian to set a weight-loss goal that is healthy and reasonable for you. Extra weight can put pressure on your knee. Pay attention to any changes in your symptoms. Keep all follow-up visits. This is important. Contact a health care provider if: Your knee pain continues, changes, or gets worse. You have a fever along with knee pain. Your knee feels warm to the touch or is red. Your knee buckles or locks up. Get help right away if: Your knee swells, and the swelling becomes worse. You cannot move your knee. You have severe pain in your knee that cannot be managed with pain medicine. Summary Acute knee pain can be caused by a  fall, an injury, an infection, or damage, swelling, or irritation of the tissues that support your knee. Your health care provider may perform tests to find out the cause of the pain. Pay attention to any changes in your symptoms. Relieve your pain with rest, medicines, light activity, and the use of ice. Get help right away if your knee swells, you cannot move your knee, or you have severe pain that cannot be managed with medicine. This information is not intended to replace advice given to you by your health care provider. Make sure you discuss  any questions you have with your healthcare provider. Document Revised: 11/07/2019 Document Reviewed: 11/07/2019 Elsevier Patient Education  2022 [employer].

## 2020-12-04 NOTE — Progress Notes (Deleted)
Established Patient Office Visit  Subjective:  Patient ID: Alyssa Nielsen, female    DOB: 1945-10-01  Age: 75 y.o. MRN: [id number]  CC:  Chief Complaint  Patient presents with   Knee Pain    Right knee, x2 weeks, pt states no falls or injuries. Pt states having pain with walking.     HPI [name] presents for ***  Past Medical History:  Diagnosis Date   Allergy    Anxiety    Arthritis 2011 and 2013   spine and L knee   Asthma    Cancer ([place])    breast cancer   Chicken pox    GERD (gastroesophageal reflux disease)    Glaucoma    Hay fever    Headache(784.0)    Hyperlipidemia    Osteoporosis    Personal history of urinary (tract) infections    Ulcer    STOMACH    Past Surgical History:  Procedure Laterality Date   BREAST EXCISIONAL BIOPSY Left 1996   BREAST EXCISIONAL BIOPSY Right 1974   BREAST LUMPECTOMY Right 1999   malignant   BREAST SURGERY  1999  1996   R BREAST (CANCEROUS)  L BREAST (BENIGN)      MOUTH SURGERY     WISDOM TEETH REMOVAL   PARTIAL KNEE ARTHROPLASTY Left 12/26/2013   Procedure: UNICOMPARTMENTAL KNEE;  Surgeon: Vickey Huger, MD;  Location: [facility];  Service: Orthopedics;  Laterality: Left;   TONSILLECTOMY  1954    Family History  Problem Relation Age of Onset   Hypertension Mother    Heart disease Mother    Stroke Mother        HEMORRAGHIC   Cancer Father 22       PANCREATIC   Hypertension Father    Hypertension Sister    Thyroid disease Sister    Stroke Sister    Hypertension Brother    Colon cancer Cousin     Social History   Socioeconomic History   Marital status: Single    Spouse name: Not on file   Number of children: Not on file   Years of education: Not on file   Highest education level: Not on file  Occupational History   Not on file  Tobacco Use   Smoking status: Former    Years: 2.00    Pack years: 0.00    Types: Cigarettes    Quit date: 06/07/1973    Years since quitting: 47.5   Smokeless tobacco: Never   Substance and Sexual Activity   Alcohol use: No   Drug use: No   Sexual activity: Never  Other Topics Concern   Not on file  Social History Narrative   Not on file   Social Determinants of Health   Financial Resource Strain: Not on file  Food Insecurity: Not on file  Transportation Needs: Not on file  Physical Activity: Not on file  Stress: Not on file  Social Connections: Not on file  Intimate Partner Violence: Not on file    Outpatient Medications Prior to Visit  Medication Sig Dispense Refill   acetaminophen (TYLENOL) 500 MG tablet Take 500 mg by mouth every 8 (eight) hours as needed for moderate pain.     aluminum & magnesium hydroxide-simethicone (MYLANTA) 500-450-40 MG/5ML suspension Take by mouth every 6 (six) hours as needed for indigestion.     brimonidine (ALPHAGAN) 0.2 % ophthalmic solution Place 1 drop into the left eye 3 (three) times daily.     Calcium Citrate-Vitamin  D (CITRACAL + D PO) Take 1 tablet by mouth daily.     fluticasone (FLONASE) 50 MCG/ACT nasal spray PLACE 2 SPRAYS INTO BOTH NOSTRILS DAILY. 16 g 11   ketorolac (ACULAR) 0.5 % ophthalmic solution INSTILL 1 DROP INTO BOTH EYES TWICE A DAY  6   Ketotifen Fumarate (ALAWAY OP) Place 1 drop into both eyes daily as needed (for allergies / itching).     Latanoprostene Bunod 0.024 % SOLN Apply 1 drop to eye at bedtime.     loratadine (CLARITIN) 10 MG tablet Take by mouth.     montelukast (SINGULAIR) 10 MG tablet TAKE 1 TABLET BY MOUTH EVERYDAY AT BEDTIME 90 tablet 3   niacin 500 MG tablet Take 500 mg by mouth daily.     Polyethyl Glycol-Propyl Glycol (SYSTANE OP) Place 1 drop into both eyes daily as needed (for dry eyes).     WIXELA INHUB 250-50 MCG/DOSE AEPB INHALE 1 PUFF INTO THE LUNGS DAILY. RINSE MOUTH AFTER EACH USE 60 each 4   No facility-administered medications prior to visit.    Allergies  Allergen Reactions   Nsaids Other (See Comments)    Pt stated they make her stomach burn   Aspirin Other  (See Comments)    GI UPSET-STOMACH BURNS   Ibuprofen Other (See Comments)    STOMACH BURNS SLIGHTLY    ROS Review of Systems    Objective:    Physical Exam  BP 134/80 (BP Location: Right Arm, Patient Position: Sitting, Cuff Size: Normal)   Pulse 96   Temp 98.8 F (37.1 C) (Oral)   Resp 18   Ht 4\' 11"  (1.499 m)   Wt 117 lb 3.2 oz (53.2 kg)   SpO2 96%   BMI 23.67 kg/m  Wt Readings from Last 3 Encounters:  12/04/20 117 lb 3.2 oz (53.2 kg)  05/24/19 117 lb (53.1 kg)  03/27/18 120 lb 12.8 oz (54.8 kg)     Health Maintenance Due  Topic Date Due   COVID-19 Vaccine (1) Never done   Zoster Vaccines- Shingrix (2 of 2) 03/26/2019    There are no preventive care reminders to display for this patient.  Lab Results  Component Value Date   TSH 1.46 03/27/2018   Lab Results  Component Value Date   WBC 5.5 05/24/2019   HGB 13.2 05/24/2019   HCT 39.9 05/24/2019   MCV 91.3 05/24/2019   PLT 283.0 05/24/2019   Lab Results  Component Value Date   NA 140 05/24/2019   K 4.1 05/24/2019   CO2 29 05/24/2019   GLUCOSE 97 05/24/2019   BUN 15 05/24/2019   CREATININE 0.70 05/24/2019   BILITOT 0.5 05/24/2019   ALKPHOS 84 05/24/2019   AST 20 05/24/2019   ALT 16 05/24/2019   PROT 6.5 05/24/2019   ALBUMIN 4.1 05/24/2019   CALCIUM 10.0 05/24/2019   ANIONGAP 17 (H) 12/20/2013   GFR 81.63 05/24/2019   Lab Results  Component Value Date   CHOL 237 (H) 05/24/2019   Lab Results  Component Value Date   HDL 108.30 05/24/2019   Lab Results  Component Value Date   LDLCALC 113 (H) 05/24/2019   Lab Results  Component Value Date   TRIG 78.0 05/24/2019   Lab Results  Component Value Date   CHOLHDL 2 05/24/2019   Lab Results  Component Value Date   HGBA1C 5.3 10/10/2012      Assessment & Plan:   Problem List Items Addressed This Visit  Unprioritized   Acute pain of right knee - Primary    Knee sleeve Ice voltaren gel pred taper F/u 2 weeks or sooner if pain  does not improve and we will refer to sport me       Relevant Medications   predniSONE (DELTASONE) 10 MG tablet   Other Relevant Orders   DG Knee Complete 4 Views Right    Meds ordered this encounter  Medications   predniSONE (DELTASONE) 10 MG tablet    Sig: TAKE 3 TABLETS PO QD FOR 3 DAYS THEN TAKE 2 TABLETS PO QD FOR 3 DAYS THEN TAKE 1 TABLET PO QD FOR 3 DAYS THEN TAKE 1/2 TAB PO QD FOR 3 DAYS    Dispense:  20 tablet    Refill:  0    Follow-up: Return if symptoms worsen or fail to improve.    Ann Held, DO

## 2020-12-04 NOTE — Progress Notes (Signed)
Established Patient Office Visit  Subjective:  Patient ID: Alyssa Nielsen, female    DOB: June 02, 1946  Age: 75 y.o. MRN: [id number]  CC:  Chief Complaint  Patient presents with   Knee Pain    Right knee, x2 weeks, pt states no falls or injuries. Pt states having pain with walking.     HPI Alyssa Nielsen presents for R knee pain x 2 weeks  no known injury.   Pain is in the med knee.  No redness, no swelling/ pain in calf  Past Medical History:  Diagnosis Date   Allergy    Anxiety    Arthritis 2011 and 2013   spine and L knee   Asthma    Cancer ([place])    breast cancer   Chicken pox    GERD (gastroesophageal reflux disease)    Glaucoma    Hay fever    Headache(784.0)    Hyperlipidemia    Osteoporosis    Personal history of urinary (tract) infections    Ulcer    STOMACH    Past Surgical History:  Procedure Laterality Date   BREAST EXCISIONAL BIOPSY Left 1996   BREAST EXCISIONAL BIOPSY Right 1974   BREAST LUMPECTOMY Right 1999   malignant   BREAST SURGERY  1999  1996   R BREAST (CANCEROUS)  L BREAST (BENIGN)      MOUTH SURGERY     WISDOM TEETH REMOVAL   PARTIAL KNEE ARTHROPLASTY Left 12/26/2013   Procedure: UNICOMPARTMENTAL KNEE;  Surgeon: Vickey Huger, MD;  Location: [facility];  Service: Orthopedics;  Laterality: Left;   TONSILLECTOMY  1954    Family History  Problem Relation Age of Onset   Hypertension Mother    Heart disease Mother    Stroke Mother        HEMORRAGHIC   Cancer Father 40       PANCREATIC   Hypertension Father    Hypertension Sister    Thyroid disease Sister    Stroke Sister    Hypertension Brother    Colon cancer Cousin     Social History   Socioeconomic History   Marital status: Single    Spouse name: Not on file   Number of children: Not on file   Years of education: Not on file   Highest education level: Not on file  Occupational History   Not on file  Tobacco Use   Smoking status: Former    Years: 2.00    Pack years: 0.00     Types: Cigarettes    Quit date: 06/07/1973    Years since quitting: 47.5   Smokeless tobacco: Never  Substance and Sexual Activity   Alcohol use: No   Drug use: No   Sexual activity: Never  Other Topics Concern   Not on file  Social History Narrative   Not on file   Social Determinants of Health   Financial Resource Strain: Not on file  Food Insecurity: Not on file  Transportation Needs: Not on file  Physical Activity: Not on file  Stress: Not on file  Social Connections: Not on file  Intimate Partner Violence: Not on file    Outpatient Medications Prior to Visit  Medication Sig Dispense Refill   acetaminophen (TYLENOL) 500 MG tablet Take 500 mg by mouth every 8 (eight) hours as needed for moderate pain.     aluminum & magnesium hydroxide-simethicone (MYLANTA) 500-450-40 MG/5ML suspension Take by mouth every 6 (six) hours as needed for indigestion.  brimonidine (ALPHAGAN) 0.2 % ophthalmic solution Place 1 drop into the left eye 3 (three) times daily.     Calcium Citrate-Vitamin D (CITRACAL + D PO) Take 1 tablet by mouth daily.     fluticasone (FLONASE) 50 MCG/ACT nasal spray PLACE 2 SPRAYS INTO BOTH NOSTRILS DAILY. 16 g 11   ketorolac (ACULAR) 0.5 % ophthalmic solution INSTILL 1 DROP INTO BOTH EYES TWICE A DAY  6   Ketotifen Fumarate (ALAWAY OP) Place 1 drop into both eyes daily as needed (for allergies / itching).     Latanoprostene Bunod 0.024 % SOLN Apply 1 drop to eye at bedtime.     loratadine (CLARITIN) 10 MG tablet Take by mouth.     montelukast (SINGULAIR) 10 MG tablet TAKE 1 TABLET BY MOUTH EVERYDAY AT BEDTIME 90 tablet 3   niacin 500 MG tablet Take 500 mg by mouth daily.     Polyethyl Glycol-Propyl Glycol (SYSTANE OP) Place 1 drop into both eyes daily as needed (for dry eyes).     WIXELA INHUB 250-50 MCG/DOSE AEPB INHALE 1 PUFF INTO THE LUNGS DAILY. RINSE MOUTH AFTER EACH USE 60 each 4   No facility-administered medications prior to visit.    Allergies  Allergen  Reactions   Nsaids Other (See Comments)    Pt stated they make her stomach burn   Aspirin Other (See Comments)    GI UPSET-STOMACH BURNS   Ibuprofen Other (See Comments)    STOMACH BURNS SLIGHTLY    ROS Review of Systems  Constitutional:  Negative for appetite change, diaphoresis, fatigue and unexpected weight change.  Eyes:  Negative for pain, redness and visual disturbance.  Respiratory:  Negative for cough, chest tightness, shortness of breath and wheezing.   Cardiovascular:  Negative for chest pain, palpitations and leg swelling.  Endocrine: Negative for cold intolerance, heat intolerance, polydipsia, polyphagia and polyuria.  Genitourinary:  Negative for difficulty urinating, dysuria and frequency.  Musculoskeletal:  Positive for arthralgias, gait problem and joint swelling.  Neurological:  Negative for dizziness, light-headedness, numbness and headaches.     Objective:    Physical Exam Vitals and nursing note reviewed.  Constitutional:      Appearance: She is well-developed.  HENT:     Head: Normocephalic and atraumatic.  Eyes:     Conjunctiva/sclera: Conjunctivae normal.  Neck:     Thyroid: No thyromegaly.     Vascular: No carotid bruit or JVD.  Cardiovascular:     Rate and Rhythm: Normal rate and regular rhythm.     Heart sounds: Normal heart sounds. No murmur heard. Pulmonary:     Effort: Pulmonary effort is normal. No respiratory distress.     Breath sounds: Normal breath sounds. No wheezing or rales.  Chest:     Chest wall: No tenderness.  Musculoskeletal:        General: Swelling and tenderness present. No deformity or signs of injury.     Cervical back: Normal range of motion and neck supple.     Right knee: Swelling present. No deformity, erythema, bony tenderness or crepitus. Normal range of motion. Tenderness present over the medial joint line. Normal alignment, normal meniscus and normal patellar mobility.     Left knee: Normal.     Right lower leg: No  edema.     Left lower leg: No edema.  Neurological:     Mental Status: She is alert and oriented to person, place, and time.    BP 134/80 (BP Location: Right Arm, Patient Position: Sitting,  Cuff Size: Normal)   Pulse 96   Temp 98.8 F (37.1 C) (Oral)   Resp 18   Ht 4\' 11"  (1.499 m)   Wt 117 lb 3.2 oz (53.2 kg)   SpO2 96%   BMI 23.67 kg/m  Wt Readings from Last 3 Encounters:  12/04/20 117 lb 3.2 oz (53.2 kg)  05/24/19 117 lb (53.1 kg)  03/27/18 120 lb 12.8 oz (54.8 kg)     Health Maintenance Due  Topic Date Due   COVID-19 Vaccine (1) Never done   Zoster Vaccines- Shingrix (2 of 2) 03/26/2019    There are no preventive care reminders to display for this patient.  Lab Results  Component Value Date   TSH 1.46 03/27/2018   Lab Results  Component Value Date   WBC 5.5 05/24/2019   HGB 13.2 05/24/2019   HCT 39.9 05/24/2019   MCV 91.3 05/24/2019   PLT 283.0 05/24/2019   Lab Results  Component Value Date   NA 140 05/24/2019   K 4.1 05/24/2019   CO2 29 05/24/2019   GLUCOSE 97 05/24/2019   BUN 15 05/24/2019   CREATININE 0.70 05/24/2019   BILITOT 0.5 05/24/2019   ALKPHOS 84 05/24/2019   AST 20 05/24/2019   ALT 16 05/24/2019   PROT 6.5 05/24/2019   ALBUMIN 4.1 05/24/2019   CALCIUM 10.0 05/24/2019   ANIONGAP 17 (H) 12/20/2013   GFR 81.63 05/24/2019   Lab Results  Component Value Date   CHOL 237 (H) 05/24/2019   Lab Results  Component Value Date   HDL 108.30 05/24/2019   Lab Results  Component Value Date   LDLCALC 113 (H) 05/24/2019   Lab Results  Component Value Date   TRIG 78.0 05/24/2019   Lab Results  Component Value Date   CHOLHDL 2 05/24/2019   Lab Results  Component Value Date   HGBA1C 5.3 10/10/2012      Assessment & Plan:   Problem List Items Addressed This Visit       Unprioritized   Acute pain of right knee - Primary    Knee sleeve Ice voltaren gel pred taper F/u 2 weeks or sooner if pain does not improve and we will refer  to sport me       Relevant Medications   predniSONE (DELTASONE) 10 MG tablet   Other Relevant Orders   DG Knee Complete 4 Views Right    Meds ordered this encounter  Medications   predniSONE (DELTASONE) 10 MG tablet    Sig: TAKE 3 TABLETS PO QD FOR 3 DAYS THEN TAKE 2 TABLETS PO QD FOR 3 DAYS THEN TAKE 1 TABLET PO QD FOR 3 DAYS THEN TAKE 1/2 TAB PO QD FOR 3 DAYS    Dispense:  20 tablet    Refill:  0    Follow-up: Return if symptoms worsen or fail to improve.    Ann Held, DO

## 2020-12-04 NOTE — Assessment & Plan Note (Signed)
Knee sleeve Ice voltaren gel pred taper F/u 2 weeks or sooner if pain does not improve and we will refer to sport me

## 2020-12-07 ENCOUNTER — Other Ambulatory Visit: Payer: Self-pay | Admitting: Family Medicine

## 2020-12-07 DIAGNOSIS — G8929 Other chronic pain: Secondary | ICD-10-CM

## 2020-12-11 ENCOUNTER — Other Ambulatory Visit: Payer: Self-pay | Admitting: *Deleted

## 2020-12-11 DIAGNOSIS — G8929 Other chronic pain: Secondary | ICD-10-CM

## 2020-12-15 ENCOUNTER — Telehealth: Payer: Self-pay

## 2020-12-15 NOTE — Telephone Encounter (Signed)
I called spoken to [facility] and she has informed me that that pts Mri is in the que and she can call directly to get it scheduled. The phone number to call is [phone]. I have called the pt and informed her of this. She stated understanding and will call today.

## 2020-12-15 NOTE — Telephone Encounter (Signed)
Pt called to inform us that she now has an orthopedics appointment scheduled for 01/01/21 @ 1pm with [facility]. Pt wanted to see if she was supposed to get the Mri done before she was to go.   Can you help me look into if the MRI is on their work que for radiology?

## 2020-12-22 ENCOUNTER — Other Ambulatory Visit: Payer: Self-pay | Admitting: Family Medicine

## 2020-12-22 DIAGNOSIS — J4520 Mild intermittent asthma, uncomplicated: Secondary | ICD-10-CM

## 2020-12-29 ENCOUNTER — Other Ambulatory Visit: Payer: Self-pay

## 2020-12-29 ENCOUNTER — Ambulatory Visit (HOSPITAL_COMMUNITY)
Admission: RE | Admit: 2020-12-29 | Discharge: 2020-12-29 | Disposition: A | Discharge status: Home / Self Care | Payer: Medicare Other | Source: Ambulatory Visit | Attending: Family Medicine | Admitting: Family Medicine

## 2020-12-29 DIAGNOSIS — M25561 Pain in right knee: Secondary | ICD-10-CM | POA: Diagnosis present

## 2020-12-29 DIAGNOSIS — G8929 Other chronic pain: Secondary | ICD-10-CM | POA: Insufficient documentation

## 2021-02-25 ENCOUNTER — Encounter: Payer: Self-pay | Admitting: Family Medicine

## 2021-02-25 ENCOUNTER — Other Ambulatory Visit: Payer: Self-pay

## 2021-02-25 ENCOUNTER — Ambulatory Visit (INDEPENDENT_AMBULATORY_CARE_PROVIDER_SITE_OTHER): Payer: Medicare Other | Admitting: Family Medicine

## 2021-02-25 VITALS — BP 141/80 | HR 75 | Temp 98.8°F | Ht 59.0 in | Wt 114.4 lb

## 2021-02-25 DIAGNOSIS — M7989 Other specified soft tissue disorders: Secondary | ICD-10-CM

## 2021-02-25 DIAGNOSIS — R634 Abnormal weight loss: Secondary | ICD-10-CM

## 2021-02-25 DIAGNOSIS — Z23 Encounter for immunization: Secondary | ICD-10-CM | POA: Diagnosis not present

## 2021-02-25 LAB — TSH: TSH: 1.57 u[IU]/mL (ref 0.35–5.50)

## 2021-02-25 NOTE — Addendum Note (Signed)
Addended by: Sharon Seller B on: 02/25/2021 11:50 AM   Modules accepted: Orders

## 2021-02-25 NOTE — Patient Instructions (Signed)
Give [place] 2-3 business days to get the results of your labs back.   For the swelling in your lower extremities, be sure to elevate your legs when able, mind the salt intake, stay physically active and consider wearing compression stockings.  Consider metatarsal pads for your R foot.   Let us know if you need anything.

## 2021-02-25 NOTE — Progress Notes (Signed)
Chief Complaint  Patient presents with   Foot Swelling    Ankles swelling Left foot swelling    Alyssa Nielsen here for bilateral ankle swelling.  Duration: 2 weeks Hx of prolonged bedrest, recent surgery, travel or injury? No Pain the calf? No SOB? No Personal or family history of clot or bleeding disorder? No Hx of heart failure, renal failure, hepatic failure? No Has been less active over the past 3 mo due to knee pain.   Past Medical History:  Diagnosis Date   Allergy    Anxiety    Arthritis 2011 and 2013   spine and L knee   Asthma    Cancer ([place])    breast cancer   Chicken pox    GERD (gastroesophageal reflux disease)    Glaucoma    Hay fever    Headache(784.0)    Hyperlipidemia    Osteoporosis    Personal history of urinary (tract) infections    Ulcer    STOMACH   Family History  Problem Relation Age of Onset   Hypertension Mother    Heart disease Mother    Stroke Mother        HEMORRAGHIC   Cancer Father 38       PANCREATIC   Hypertension Father    Hypertension Sister    Thyroid disease Sister    Stroke Sister    Hypertension Brother    Colon cancer Cousin    Past Surgical History:  Procedure Laterality Date   BREAST EXCISIONAL BIOPSY Left 1996   BREAST EXCISIONAL BIOPSY Right 1974   BREAST LUMPECTOMY Right 1999   malignant   BREAST SURGERY  1999  1996   R BREAST (CANCEROUS)  L BREAST (BENIGN)      MOUTH SURGERY     WISDOM TEETH REMOVAL   PARTIAL KNEE ARTHROPLASTY Left 12/26/2013   Procedure: UNICOMPARTMENTAL KNEE;  Surgeon: Vickey Huger, MD;  Location: [facility];  Service: Orthopedics;  Laterality: Left;   TONSILLECTOMY  1954    Current Outpatient Medications:    acetaminophen (TYLENOL) 500 MG tablet, Take 500 mg by mouth every 8 (eight) hours as needed for moderate pain., Disp: , Rfl:    aluminum & magnesium hydroxide-simethicone (MYLANTA) 500-450-40 MG/5ML suspension, Take by mouth every 6 (six) hours as needed for indigestion., Disp: , Rfl:     brimonidine (ALPHAGAN) 0.2 % ophthalmic solution, Place 1 drop into the left eye 3 (three) times daily., Disp: , Rfl:    Calcium Citrate-Vitamin D (CITRACAL + D PO), Take 1 tablet by mouth daily., Disp: , Rfl:    fluticasone (FLONASE) 50 MCG/ACT nasal spray, PLACE 2 SPRAYS INTO BOTH NOSTRILS DAILY., Disp: 16 g, Rfl: 11   ketorolac (ACULAR) 0.5 % ophthalmic solution, INSTILL 1 DROP INTO BOTH EYES TWICE A DAY, Disp: , Rfl: 6   Ketotifen Fumarate (ALAWAY OP), Place 1 drop into both eyes daily as needed (for allergies / itching)., Disp: , Rfl:    Latanoprostene Bunod 0.024 % SOLN, Apply 1 drop to eye at bedtime., Disp: , Rfl:    loratadine (CLARITIN) 10 MG tablet, Take by mouth., Disp: , Rfl:    montelukast (SINGULAIR) 10 MG tablet, TAKE 1 TABLET BY MOUTH EVERYDAY AT BEDTIME, Disp: 90 tablet, Rfl: 3   niacin 500 MG tablet, Take 500 mg by mouth daily., Disp: , Rfl:    Polyethyl Glycol-Propyl Glycol (SYSTANE OP), Place 1 drop into both eyes daily as needed (for dry eyes)., Disp: , Rfl:  WIXELA INHUB 250-50 MCG/ACT AEPB, INHALE 1 PUFF INTO THE LUNGS DAILY. RINSE MOUTH AFTER EACH USE, Disp: 60 each, Rfl: 4  BP (!) 141/80   Pulse 75   Temp 98.8 F (37.1 C) (Oral)   Ht 4\' 11"  (1.499 m)   Wt 114 lb 6 oz (51.9 kg)   SpO2 97%   BMI 23.10 kg/m  Gen- awake, alert, appears stated age Heart- RRR, no gallops, 3+ pitting lower extremity edema around the right ankle, 2+ pitting lower extremity and around the left ankle tapering at the distal third of the tibia Lungs- CTAB, normal effort w/o accessory muscle use MSK- no calf pain b/l, negative Homans bilaterally Psych: Age appropriate judgment and insight  Localized swelling of both lower extremities - Plan: Comprehensive metabolic panel  Weight loss - Plan: TSH  New problem, uncertain prognosis at this time.  Check above labs.  Activity as tolerated with her right knee issue.  Elevate legs.  Mind salt intake, should probably stop adding salt to her  food.  Consider compression stockings. F/u prn. Pt voiced understanding and agreement to the plan.  Normal, DO 02/25/21  11:45 AM

## 2021-02-26 LAB — COMPREHENSIVE METABOLIC PANEL
ALT: 15 U/L (ref 0–35)
AST: 19 U/L (ref 0–37)
Albumin: 3.8 g/dL (ref 3.5–5.2)
Alkaline Phosphatase: 73 U/L (ref 39–117)
BUN: 11 mg/dL (ref 6–23)
CO2: 27 mEq/L (ref 19–32)
Calcium: 10.4 mg/dL (ref 8.4–10.5)
Chloride: 105 mEq/L (ref 96–112)
Creatinine, Ser: 0.66 mg/dL (ref 0.40–1.20)
GFR: 85.16 mL/min (ref >60.00)
Glucose, Bld: 100 mg/dL — ABNORMAL HIGH (ref 70–99)
Potassium: 4.1 mEq/L (ref 3.5–5.1)
Sodium: 142 mEq/L (ref 135–145)
Total Bilirubin: 0.4 mg/dL (ref 0.2–1.2)
Total Protein: 6.1 g/dL (ref 6.0–8.3)

## 2021-05-04 ENCOUNTER — Other Ambulatory Visit: Payer: Self-pay | Admitting: Family Medicine

## 2021-05-04 DIAGNOSIS — J4520 Mild intermittent asthma, uncomplicated: Secondary | ICD-10-CM

## 2021-07-16 ENCOUNTER — Telehealth: Payer: Self-pay | Admitting: Family Medicine

## 2021-07-16 NOTE — Telephone Encounter (Signed)
Contact Type Call Who Is Calling Patient / Member / Family / Caregiver Call Type Triage / Clinical Relationship To Patient Self Return Phone Number [phone] (Primary) Chief Complaint Swallowing Difficulty Reason for Call Symptomatic / Request for Health Information Initial Comment Caller states she is having difficulty swallowing. Translation No Nurse Assessment Nurse: Roselie Awkward, RN, Heather Date/Time (Eastern Time): 07/16/2021 1:33:40 PM Confirm and document reason for call. If symptomatic, describe symptoms. ---Caller states she has had some trouble swallowing the last few weeks. A few days ago, swallowing started to worsen. Denies difficulty breathing. Can swallow but has trouble getting everything down  Guideline Title Affirmed Question Affirmed Notes Nurse Date/Time Eilene Ghazi Time) Swallowing Difficulty Wheezing, stridor, hoarseness, or difficulty breathing Amadeo Garnet 07/16/2021 1:35:53 PM Disp. Time Eilene Ghazi Time) Disposition Final User 07/16/2021 1:44:22 PM 911 Outcome Documentation Roselie Awkward, RN, Nira Conn Reason: Caller aware of 911 outcome. States she will drive to ED or UC. PLEASE NOTE: All timestamps contained within this report are represented as [place] Standard Time. CONFIDENTIALTY NOTICE: This fax transmission is intended only for the addressee. It contains information that is legally privileged, confidential or otherwise protected from use or disclosure. If you are not the intended recipient, you are strictly prohibited from reviewing, disclosing, copying using or disseminating any of this information or taking any action in reliance on or regarding this information. If you have received this fax in error, please notify us immediately by telephone so that we can arrange for its return to [place]. Phone: [phone], Toll-Free: [phone], Fax: [phone] Page: 2 of 2 Call Id: [id number] [place]. Time (Eastern Time) Disposition Final User Reiterated importance of  immediate evaluation for airway concerns. 07/16/2021 1:42:25 PM Call EMS 911 Now Yes Roselie Awkward, RN, Heather  Comments User: Mauri Pole, RN Date/Time Eilene Ghazi Time): 07/16/2021 1:36:08 PM also feeling some heart burn User: Mauri Pole, RN Date/Time (Eastern Time): 07/16/2021 1:44:51 PM Seems to be breathing without difficulty during call

## 2021-07-16 NOTE — Telephone Encounter (Signed)
Patient went to ER>

## 2021-07-16 NOTE — Telephone Encounter (Signed)
Patient is having having difficulty swallowing, transferred patient to triage

## 2021-07-20 ENCOUNTER — Ambulatory Visit (INDEPENDENT_AMBULATORY_CARE_PROVIDER_SITE_OTHER): Payer: Medicare Other | Admitting: Family Medicine

## 2021-07-20 VITALS — BP 157/82 | HR 85 | Temp 98.4°F | Resp 18 | Wt 114.2 lb

## 2021-07-20 DIAGNOSIS — I872 Venous insufficiency (chronic) (peripheral): Secondary | ICD-10-CM

## 2021-07-20 DIAGNOSIS — I10 Essential (primary) hypertension: Secondary | ICD-10-CM

## 2021-07-20 MED ORDER — HYDROCHLOROTHIAZIDE 12.5 MG PO CAPS: 12.5000 mg | ORAL_CAPSULE | Freq: Every day | ORAL | 3 refills | Status: DC

## 2021-07-20 NOTE — Progress Notes (Signed)
[place] at [facility] [address], [place], [place] [address] [phone]  Date:  07/20/2021   Name:  Alyssa Nielsen   DOB:  02-Dec-1945   MRN:  [id number]  PCP:  Darreld Mclean, MD    Chief Complaint: Dysphagia (Was advised to be seen at the ED, once at the ED BP was elevated. Pt has been concerned since then.)   History of Present Illness:  Alyssa Nielsen is a 76 y.o. very pleasant female patient who presents with the following:  Pt seen today with concern of elevated BP- History of osteoporosis, vitamin D deficiency, hypertension, arthritis, breast cancer Last visit with myself was 05/2019 She did contact us recently with concern of difficulty swallowing She ended up being seen at the [facility] on February 9 They recommended follow-up with myself and also ENT- she has an appt with Dr Laurance Flatten with ENT 3/1  BP at ER 158/102 Labs on chart from September 2022-CMP, TSH only  BP Readings from Last 3 Encounters:  07/20/21 (!) 157/82  02/25/21 (!) 141/80  12/04/20 134/80   She note a family history of HTN She has not been treated herself for HTN although there is diagnosis of hypertension on her chart  She has noted some BLE edema which seems most c/w venous insufficiency  She has tried to wear support hose and walk more which is generally helpful She notes the swelling improves overnight, gets worse as the day goes on.  Present in both lower extremities  She had a CBC and CMP done on 2/9 at [facility]   Patient Active Problem List   Diagnosis Date Noted   Acute pain of right knee 12/04/2020   History of artificial joint 01/01/2014   Osteoarthritis of left knee 12/26/2013   Asthma 03/14/2013   OSTEOPOROSIS 06/22/2011   Psoriasis 06/22/2011   Lower back pain 06/22/2011   Vitamin D deficiency 06/22/2011   DEPRESSION 06/30/2007   Essential hypertension 06/30/2007   ARTHRITIS 06/30/2007   ALLERGIC RHINITIS  01/19/2007   GERD 01/19/2007   BREAST CANCER, HX OF 01/19/2007   ESOPHAGEAL STRICTURE 08/24/2004   DIVERTICULOSIS OF COLON 07/28/2004    Past Medical History:  Diagnosis Date   Allergy    Anxiety    Arthritis 2011 and 2013   spine and L knee   Asthma    Cancer ([place])    breast cancer   Chicken pox    GERD (gastroesophageal reflux disease)    Glaucoma    Hay fever    Headache(784.0)    Hyperlipidemia    Osteoporosis    Personal history of urinary (tract) infections    Ulcer    STOMACH    Past Surgical History:  Procedure Laterality Date   BREAST EXCISIONAL BIOPSY Left 1996   BREAST EXCISIONAL BIOPSY Right 1974   BREAST LUMPECTOMY Right 1999   malignant   BREAST SURGERY  1999  1996   R BREAST (CANCEROUS)  L BREAST (BENIGN)      MOUTH SURGERY     WISDOM TEETH REMOVAL   PARTIAL KNEE ARTHROPLASTY Left 12/26/2013   Procedure: UNICOMPARTMENTAL KNEE;  Surgeon: Vickey Huger, MD;  Location: [place];  Service: Orthopedics;  Laterality: Left;   TONSILLECTOMY  1954    Social History   Tobacco Use   Smoking status: Former    Years: 2.00    Types: Cigarettes    Quit date: 06/07/1973  Years since quitting: 48.1   Smokeless tobacco: Never  Substance Use Topics   Alcohol use: No   Drug use: No    Family History  Problem Relation Age of Onset   Hypertension Mother    Heart disease Mother    Stroke Mother        HEMORRAGHIC   Cancer Father 22       PANCREATIC   Hypertension Father    Hypertension Sister    Thyroid disease Sister    Stroke Sister    Hypertension Brother    Colon cancer Cousin     Allergies  Allergen Reactions   Nsaids Other (See Comments)    Pt stated they make her stomach burn   Aspirin Other (See Comments)    GI UPSET-STOMACH BURNS   Ibuprofen Other (See Comments)    STOMACH BURNS SLIGHTLY    Medication list has been reviewed and updated.  Current Outpatient Medications on File Prior to Visit  Medication Sig Dispense Refill    acetaminophen (TYLENOL) 500 MG tablet Take 500 mg by mouth every 8 (eight) hours as needed for moderate pain.     aluminum & magnesium hydroxide-simethicone (MYLANTA) 500-450-40 MG/5ML suspension Take by mouth every 6 (six) hours as needed for indigestion.     Calcium Citrate-Vitamin D (CITRACAL + D PO) Take 1 tablet by mouth daily.     Ketotifen Fumarate (ALAWAY OP) Place 1 drop into both eyes daily as needed (for allergies / itching).     Latanoprostene Bunod 0.024 % SOLN Apply 1 drop to eye at bedtime.     montelukast (SINGULAIR) 10 MG tablet TAKE 1 TABLET BY MOUTH EVERYDAY AT BEDTIME 90 tablet 0   niacin 500 MG tablet Take 500 mg by mouth daily.     Polyethyl Glycol-Propyl Glycol (SYSTANE OP) Place 1 drop into both eyes daily as needed (for dry eyes).     fluticasone-salmeterol (ADVAIR DISKUS) 250-50 MCG/ACT AEPB Inhale into the lungs.     No current facility-administered medications on file prior to visit.    Review of Systems:  As per HPI- otherwise negative.   Physical Examination: Vitals:   07/20/21 1443  BP: (!) 157/82  Pulse: 85  Resp: 18  Temp: 98.4 F (36.9 C)  SpO2: 98%   Vitals:   07/20/21 1443  Weight: 114 lb 3.2 oz (51.8 kg)   Body mass index is 23.07 kg/m. Ideal Body Weight:    GEN: no acute distress.  Petite build, kyphosis.  Otherwise looks well HEENT: Atraumatic, Normocephalic.  Ears and Nose: No external deformity. CV: RRR, No M/G/R. No JVD. No thrill. No extra heart sounds. PULM: CTA B, no wheezes, crackles, rhonchi. No retractions. No resp. distress. No accessory muscle use. EXTR: No c/c/-trace lower extremity edema bilaterally PSYCH: Normally interactive. Conversant.    Assessment and Plan: Essential hypertension - Plan: hydrochlorothiazide (MICROZIDE) 12.5 MG capsule  Venous insufficiency - Plan: hydrochlorothiazide (MICROZIDE) 12.5 MG capsule  Patient seen today with concern of essential hypertension.  She is also noted venous insufficiency  with swelling.  Decided to use a low-dose diuretic in hopes of treating both of these issues.  We will have her start on hydrochlorothiazide 12.5 mg daily.  Reviewed recent lab work from [facility] emergency room  She does not have a home blood pressure cuff  I have asked her to see me in 3 to 4 weeks to check on her blood pressure and labs  Signed Lamar Blinks, MD

## 2021-07-20 NOTE — Patient Instructions (Addendum)
It was good to see you today- we will try hydrochlorothiazide (HCTZ) daily for both BP and ankle swelling Please see me in 3-4 weeks to check on your BP and your labs

## 2021-07-28 ENCOUNTER — Other Ambulatory Visit: Payer: Self-pay | Admitting: Family Medicine

## 2021-07-28 DIAGNOSIS — J4520 Mild intermittent asthma, uncomplicated: Secondary | ICD-10-CM

## 2021-08-05 DIAGNOSIS — R0989 Other specified symptoms and signs involving the circulatory and respiratory systems: Secondary | ICD-10-CM | POA: Insufficient documentation

## 2021-08-05 HISTORY — DX: Other specified symptoms and signs involving the circulatory and respiratory systems: R09.89

## 2021-08-14 NOTE — Progress Notes (Addendum)
Therapist, [facility] at [facility] ?[place], [address] ?[place], [place] [address] ?336 [phone] ?Fax [phone] ? ?Date:  08/17/2021  ? ?Name:  Alyssa Nielsen   DOB:  1946-05-16   MRN:  [id number] ? ?PCP:  Darreld Mclean, MD  ? ? ?Chief Complaint: Follow-up ( 3-4 wk fu appt  for htn and Labs/Concerns/ questions: none/) ? ? ?History of Present Illness: ? ?Alyssa Nielsen is a 76 y.o. very pleasant female patient who presents with the following: ? ?Patient seen today for follow-up ?Last visit with myself was February 13 for concern of elevated blood pressure- History of osteoporosis, vitamin D deficiency, hypertension, arthritis, breast cancer ?At that time she was not on medication for hypertension.  She did also have some bilateral lower extremity edema, most likely venous insufficiency-I started her on HCTZ 12.5 for both conditions ? ?She was in the ER about a month ago with difficulty swallowing and hoarse voice for several days ?She was seen by ENT, Dr. Hassell Done March 1-  ?EXAMINATION reveals relatively normal sounding voice with no audible stridor. Oral cavity oropharynx is unremarkable. Neck without adenopathy or thyromegaly. ?PLAN: Reassured with normal laryngoscopy there is nothing bad going on. We discussed reflux irritation of the larynx as most likely contributor to her symptoms. We will try diagnostic trial of acid suppression. Call if not improving or worsening. Otherwise follow-up as needed ? ?Pt notes that her LE swelling seems to be better, she is using compression stockings ?She has not noted any SE-of medication not getting lightheaded  ?Pt notes she has 2 sisters who are not well and who depend on her- they do not have kids of their own ?She is feeling overwhelmed and anxious ?She has been feeling down since her sister went into the nursing home after her stroke last year  ?No Si ?She would like to try medication for depression and anxiety  ? ?BP Readings from Last 3  Encounters:  ?08/17/21 122/70  ?07/20/21 (!) 157/82  ?02/25/21 (!) 141/80  ? ? ? ? ?Patient Active Problem List  ? Diagnosis Date Noted  ? Acute pain of right knee 12/04/2020  ? History of artificial joint 01/01/2014  ? Osteoarthritis of left knee 12/26/2013  ? Asthma 03/14/2013  ? OSTEOPOROSIS 06/22/2011  ? Psoriasis 06/22/2011  ? Lower back pain 06/22/2011  ? Vitamin D deficiency 06/22/2011  ? DEPRESSION 06/30/2007  ? Essential hypertension 06/30/2007  ? ARTHRITIS 06/30/2007  ? ALLERGIC RHINITIS 01/19/2007  ? GERD 01/19/2007  ? BREAST CANCER, HX OF 01/19/2007  ? ESOPHAGEAL STRICTURE 08/24/2004  ? DIVERTICULOSIS OF COLON 07/28/2004  ? ? ?Past Medical History:  ?Diagnosis Date  ? Allergy   ? Anxiety   ? Arthritis 2011 and 2013  ? spine and L knee  ? Asthma   ? Cancer [facility])   ? breast cancer  ? Chicken pox   ? GERD (gastroesophageal reflux disease)   ? Glaucoma   ? Hay fever   ? Headache(784.0)   ? Hyperlipidemia   ? Osteoporosis   ? Personal history of urinary (tract) infections   ? Ulcer   ? STOMACH  ? ? ?Past Surgical History:  ?Procedure Laterality Date  ? BREAST EXCISIONAL BIOPSY Left 1996  ? BREAST EXCISIONAL BIOPSY Right 1974  ? BREAST LUMPECTOMY Right 1999  ? malignant  ? [place]  ? R BREAST (CANCEROUS)  L BREAST (BENIGN)     ? MOUTH SURGERY    ?  WISDOM TEETH REMOVAL  ? PARTIAL KNEE ARTHROPLASTY Left 12/26/2013  ? Procedure: UNICOMPARTMENTAL KNEE;  Surgeon: Vickey Huger, MD;  Location: [place];  Service: Orthopedics;  Laterality: Left;  ? TONSILLECTOMY  1954  ? ? ?Social History  ? ?Tobacco Use  ? Smoking status: Former  ?  Years: 2.00  ?  Types: Cigarettes  ?  Quit date: 06/07/1973  ?  Years since quitting: 48.2  ? Smokeless tobacco: Never  ?Substance Use Topics  ? Alcohol use: No  ? Drug use: No  ? ? ?Family History  ?Problem Relation Age of Onset  ? Hypertension Mother   ? Heart disease Mother   ? Stroke Mother   ?     HEMORRAGHIC  ? Cancer Father 70  ?     PANCREATIC  ? Hypertension Father    ? Hypertension Sister   ? Thyroid disease Sister   ? Stroke Sister   ? Hypertension Brother   ? Colon cancer Cousin   ? ? ?Allergies  ?Allergen Reactions  ? Nsaids Other (See Comments)  ?  Pt stated they make her stomach burn  ? Aspirin Other (See Comments)  ?  GI UPSET-STOMACH BURNS  ? Ibuprofen Other (See Comments)  ?  STOMACH BURNS SLIGHTLY  ? ? ?Medication list has been reviewed and updated. ? ?Current Outpatient Medications on File Prior to Visit  ?Medication Sig Dispense Refill  ? acetaminophen (TYLENOL) 500 MG tablet Take 500 mg by mouth every 8 (eight) hours as needed for moderate pain.    ? aluminum & magnesium hydroxide-simethicone (MYLANTA) 500-450-40 MG/5ML suspension Take by mouth every 6 (six) hours as needed for indigestion.    ? Calcium Citrate-Vitamin D (CITRACAL + D PO) Take 1 tablet by mouth daily.    ? fluticasone-salmeterol (ADVAIR) 250-50 MCG/ACT AEPB Inhale into the lungs.    ? hydrochlorothiazide (MICROZIDE) 12.5 MG capsule Take 1 capsule (12.5 mg total) by mouth daily. 30 capsule 3  ? Ketotifen Fumarate (ALAWAY OP) Place 1 drop into both eyes daily as needed (for allergies / itching).    ? Latanoprostene Bunod 0.024 % SOLN Apply 1 drop to eye at bedtime.    ? montelukast (SINGULAIR) 10 MG tablet TAKE 1 TABLET BY MOUTH EVERYDAY AT BEDTIME 90 tablet 0  ? niacin 500 MG tablet Take 500 mg by mouth daily.    ? omeprazole (PRILOSEC) 40 MG capsule Take 40 mg by mouth daily.    ? Polyethyl Glycol-Propyl Glycol (SYSTANE OP) Place 1 drop into both eyes daily as needed (for dry eyes).    ? ?No current facility-administered medications on file prior to visit.  ? ? ?Review of Systems: ? ?As per HPI- otherwise negative. ? ? ?Physical Examination: ?Vitals:  ? 08/17/21 1049  ?BP: 122/70  ?Pulse: 64  ?Resp: 18  ?Temp: 98.1 ?F (36.7 ?C)  ?SpO2: 93%  ? ?Vitals:  ? 08/17/21 1049  ?Weight: 109 lb 6.4 oz (49.6 kg)  ?Height: '4\' 11"'$  (1.499 m)  ? ?Body mass index is 22.1 kg/m?. ?Ideal Body Weight: Weight in (lb) to  have BMI = 25: 123.5 ? ?GEN: no acute distress.  Petite build, tearful today  ?HEENT: Atraumatic, Normocephalic.  ?Ears and Nose: No external deformity. ?CV: RRR, No M/G/R. No JVD. No thrill. No extra heart sounds. ?PULM: CTA B, no wheezes, crackles, rhonchi. No retractions. No resp. distress. No accessory muscle use. ?ABD: S, NT, ND, +BS. No rebound. No HSM. ?EXTR: No c/c/e ?PSYCH: Normally interactive. Conversant.  ? ? ?  Assessment and Plan: ?Essential hypertension ? ?Medication monitoring encounter - Plan: Basic metabolic panel ? ?Screening for hyperlipidemia - Plan: Lipid panel ? ?Adjustment disorder with mixed anxiety and depressed mood - Plan: venlafaxine XR (EFFEXOR XR) 37.5 MG 24 hr capsule ? ?Patient seen today for follow-up.  Her blood pressure looks much better on hydrochlorothiazide 12.5.  We will check basic metabolic today ?Also check lipids ?Patient admits she is under some stress, her sister who had been her major support had a stroke and is now in the nursing home.  Another sister is now living with her and needs quite a bit of help. ?We discussed trying some counseling right now and he feels this is being 1 more thing she has to do.  She is interested in trying some medication for depression, called in Effexor for her.  I also let me know how this works for her in about 1 month, sooner if not doing okay. ? ?Signed ?Lamar Blinks, MD ? ?Received labs as below, letter to patient ? ?Results for orders placed or performed in visit on 08/17/21  ?Basic metabolic panel  ?Result Value Ref Range  ? Sodium 140 135 - 145 mEq/L  ? Potassium 3.8 3.5 - 5.1 mEq/L  ? Chloride 102 96 - 112 mEq/L  ? CO2 29 19 - 32 mEq/L  ? Glucose, Bld 96 70 - 99 mg/dL  ? BUN 13 6 - 23 mg/dL  ? Creatinine, Ser 0.64 0.40 - 1.20 mg/dL  ? GFR 85.51 >60.00 mL/min  ? Calcium 10.2 8.4 - 10.5 mg/dL  ?Lipid panel  ?Result Value Ref Range  ? Cholesterol 245 (H) 0 - 200 mg/dL  ? Triglycerides 75.0 0.0 - 149.0 mg/dL  ? HDL 119.70 >39.00 mg/dL   ? VLDL 15.0 0.0 - 40.0 mg/dL  ? LDL Cholesterol 110 (H) 0 - 99 mg/dL  ? Total CHOL/HDL Ratio 2   ? NonHDL 124.89   ? ? ? ?

## 2021-08-14 NOTE — Patient Instructions (Addendum)
It was good to see you again today ?Your BP looks good ?I will be in touch with your labs asap ?Let's try adding effexor 37.5 mg- once a day- for anxiety and sadness.  Let me know how this works for you.   ?

## 2021-08-17 ENCOUNTER — Ambulatory Visit (INDEPENDENT_AMBULATORY_CARE_PROVIDER_SITE_OTHER): Payer: Medicare Other | Admitting: Family Medicine

## 2021-08-17 VITALS — BP 122/70 | HR 64 | Temp 98.1°F | Resp 18 | Ht 59.0 in | Wt 109.4 lb

## 2021-08-17 DIAGNOSIS — I10 Essential (primary) hypertension: Secondary | ICD-10-CM | POA: Diagnosis not present

## 2021-08-17 DIAGNOSIS — F4323 Adjustment disorder with mixed anxiety and depressed mood: Secondary | ICD-10-CM

## 2021-08-17 DIAGNOSIS — Z5181 Encounter for therapeutic drug level monitoring: Secondary | ICD-10-CM | POA: Diagnosis not present

## 2021-08-17 DIAGNOSIS — Z13220 Encounter for screening for lipoid disorders: Secondary | ICD-10-CM

## 2021-08-17 LAB — BASIC METABOLIC PANEL
BUN: 13 mg/dL (ref 6–23)
CO2: 29 mEq/L (ref 19–32)
Calcium: 10.2 mg/dL (ref 8.4–10.5)
Chloride: 102 mEq/L (ref 96–112)
Creatinine, Ser: 0.64 mg/dL (ref 0.40–1.20)
GFR: 85.51 mL/min (ref >60.00)
Glucose, Bld: 96 mg/dL (ref 70–99)
Potassium: 3.8 mEq/L (ref 3.5–5.1)
Sodium: 140 mEq/L (ref 135–145)

## 2021-08-17 LAB — LIPID PANEL
Cholesterol: 245 mg/dL — ABNORMAL HIGH (ref 0–200)
HDL: 119.7 mg/dL (ref >39.00)
LDL Cholesterol: 110 mg/dL — ABNORMAL HIGH (ref 0–99)
NonHDL: 124.89
Total CHOL/HDL Ratio: 2
Triglycerides: 75 mg/dL (ref 0.0–149.0)
VLDL: 15 mg/dL (ref 0.0–40.0)

## 2021-08-17 MED ORDER — VENLAFAXINE HCL ER 37.5 MG PO CP24: 37.5000 mg | ORAL_CAPSULE | Freq: Every day | ORAL | 5 refills | Status: DC

## 2021-09-08 ENCOUNTER — Other Ambulatory Visit: Payer: Self-pay | Admitting: Family Medicine

## 2021-09-08 DIAGNOSIS — F4323 Adjustment disorder with mixed anxiety and depressed mood: Secondary | ICD-10-CM

## 2021-10-06 ENCOUNTER — Other Ambulatory Visit: Payer: Self-pay | Admitting: Family Medicine

## 2021-10-06 DIAGNOSIS — F4323 Adjustment disorder with mixed anxiety and depressed mood: Secondary | ICD-10-CM

## 2021-10-16 ENCOUNTER — Other Ambulatory Visit: Payer: Self-pay | Admitting: Family Medicine

## 2021-10-16 DIAGNOSIS — I872 Venous insufficiency (chronic) (peripheral): Secondary | ICD-10-CM

## 2021-10-16 DIAGNOSIS — I10 Essential (primary) hypertension: Secondary | ICD-10-CM

## 2021-10-23 ENCOUNTER — Other Ambulatory Visit: Payer: Self-pay | Admitting: Family Medicine

## 2021-10-23 DIAGNOSIS — J4520 Mild intermittent asthma, uncomplicated: Secondary | ICD-10-CM

## 2021-11-10 ENCOUNTER — Other Ambulatory Visit: Payer: Self-pay | Admitting: Family Medicine

## 2021-11-10 DIAGNOSIS — J4520 Mild intermittent asthma, uncomplicated: Secondary | ICD-10-CM

## 2022-01-17 ENCOUNTER — Other Ambulatory Visit: Payer: Self-pay | Admitting: Family Medicine

## 2022-01-17 DIAGNOSIS — J4520 Mild intermittent asthma, uncomplicated: Secondary | ICD-10-CM

## 2022-03-10 ENCOUNTER — Encounter: Payer: Self-pay | Admitting: *Deleted

## 2022-03-10 ENCOUNTER — Telehealth: Payer: Self-pay | Admitting: *Deleted

## 2022-03-10 NOTE — Patient Outreach (Signed)
  Care Coordination   03/10/2022 Name: Alyssa Nielsen MRN: [id number] DOB: 1945-07-21   Care Coordination Outreach Attempts:  An unsuccessful telephone outreach was attempted today to offer the patient information about available care coordination services as a benefit of their health plan.   Follow Up Plan:  Additional outreach attempts will be made to offer the patient care coordination information and services.   Encounter Outcome:  No Answer left voice message on home number requesting call-back; additional attempt on cell number- voice mail box has not been set up  Care Coordination Interventions Activated:  No   Care Coordination Interventions:  No, not indicated unsuccessful attempt # 1    Oneta Rack, RN, BSN, CCRN Alumnus RN CM Care Coordination/ Transition of [place] Management [phone]: direct office

## 2022-03-16 ENCOUNTER — Telehealth: Payer: Self-pay | Admitting: *Deleted

## 2022-03-16 ENCOUNTER — Encounter: Payer: Self-pay | Admitting: *Deleted

## 2022-03-16 NOTE — Patient Outreach (Signed)
Care Coordination   Initial Visit Note   03/16/2022 Name: Alyssa Nielsen MRN: [id number] DOB: 12-05-1945  Alyssa Nielsen is a 76 y.o. year old female who sees Copland, Gay Filler, MD for primary care. I spoke with  Alyssa Nielsen by phone today.  What matters to the patients health and wellness today?  "I decided not to take the Effexor Dr. Lorelei Pont prescribed for me because I read about the side effects and didn't want to take the chance that it might make me sleepy since I have to drive so frequently to take care of my 2 sisters...Marland KitchenMarland KitchenMarland Kitchen my stress level is still very high, but I think it may be a little better than when I saw Dr. Lorelei Pont in March..... I appreciate your offer to put me in touch with the social worker for counseling options, but I just don't have time for that right now, I am too busy taking care of my sisters.... I will make an appointment with Dr. Lorelei Pont soon and I will call you back if I decide I want to talk to the social worker"  Interventions provided; provided patient with my direct call-back number should needs arise in the future; patient declines ongoing/ further scheduled outreach today   Goals Addressed             This Visit's Progress    Care Coordination Activities: No follow Up required/ patient declines ongoing follow up   On track    Care Coordination Interventions: Evaluation of current treatment plan related to HTN/ stress- depresssion/ caregiver stress and patient's adherence to plan as established by provider Advised patient to provide appropriate vaccination information to provider or CM team member at next visit Advised patient to discuss ongoing stress levels with caregiver fatigue- she continues to feel overwhelmed in caring for her 2 sisters needs; today, she declines my offer to place CSW referral for counseling options- states she does not have time around caring for her 2 sister's needs; I encouraged her to discuss ongoing stress levels with Dr.  Lorelei Pont Reviewed medications with patient and discussed her decision to not take Effexor, prescribed on 08/17/21-- states she noted a side effect of sleepiness and she decided not to take, as she has to drive frequently to care for her 2 sisters needs- she did not want to risk taking this medication due to potential side effects Provided patient and/or caregiver with counseling information about caregiver stress/ fatigue (community resource) Reviewed scheduled/upcoming provider appointments including none-- encouraged patient to schedule follow up appointment with PCP; reviewed last PCP office visit on 08/17/21  Advised patient to discuss alternative options for antidepressant medications/ ongoing stress from care-giving with provider Screening for signs and symptoms of depression related to chronic disease state  Assessed social determinant of health barriers Confirmed patient has obtained flu vaccine ("about 3 weeks ago") and COVID booster vaccine (yesterday, 03/15/22) from outpatient [facility] in [place]-- EHR records do not indicate records have been sent to PCP office-- encouraged patient to follow up with [facility] to ensure these records are sent to PCP Reviewed blood pressure management at home- she reports occasionally monitors blood pressures at home, states "all look fine;" and confirms she continues taking medication for HTN as prescribed/ instructed Care Coordination outreach to PCP to update re: phone call with patient today, ongoing stress, patient's declining offer for counseling options/ CSW outreach          SDOH assessments and interventions completed:  Yes  SDOH Interventions Today  Flowsheet Row Most Recent Value  SDOH Interventions   Transportation Interventions Intervention Not Indicated  [drives self]  Depression Interventions/Treatment  Patient refuses Treatment  [encouraged to schedule office visit with PCP- she refuses other intervention today, wants to think about it first and  talk to PCP]  Stress Interventions Patient Refused  [declines intervention today- states "too busy"]       Care Coordination Interventions Activated:  Yes  Care Coordination Interventions:  Yes, provided   Follow up plan: No further intervention required.   Encounter Outcome:  Pt. Visit Completed   Oneta Rack, RN, BSN, CCRN Alumnus RN CM Care Coordination/ Transition of [place] Management [phone]: direct office

## 2022-04-13 ENCOUNTER — Other Ambulatory Visit: Payer: Self-pay | Admitting: Family Medicine

## 2022-04-13 DIAGNOSIS — J4520 Mild intermittent asthma, uncomplicated: Secondary | ICD-10-CM

## 2022-05-11 ENCOUNTER — Other Ambulatory Visit: Payer: Self-pay | Admitting: Family Medicine

## 2022-05-11 DIAGNOSIS — J4520 Mild intermittent asthma, uncomplicated: Secondary | ICD-10-CM

## 2022-05-12 ENCOUNTER — Other Ambulatory Visit: Payer: Self-pay | Admitting: Family Medicine

## 2022-05-12 DIAGNOSIS — J4520 Mild intermittent asthma, uncomplicated: Secondary | ICD-10-CM

## 2022-05-14 ENCOUNTER — Telehealth: Payer: Self-pay | Admitting: Family Medicine

## 2022-05-14 NOTE — Telephone Encounter (Signed)
Patient called to advise that after January her insurance will not cover advair and wants to know if Dr. Lorelei Pont could send in a prescription for a generic. Insurance will cover [employer]. Patient said she has used [place] before. She said she has enough to get her through first week of January but will need generic called in.

## 2022-05-17 MED ORDER — FLUTICASONE-SALMETEROL 250-50 MCG/ACT IN AEPB: 1.0000 | INHALATION_SPRAY | Freq: Two times a day (BID) | RESPIRATORY_TRACT | 3 refills | Status: DC

## 2022-05-17 NOTE — Telephone Encounter (Signed)
See below

## 2022-05-17 NOTE — Telephone Encounter (Signed)
Done  Meds ordered this encounter  Medications   fluticasone-salmeterol (WIXELA INHUB) 250-50 MCG/ACT AEPB    Sig: Inhale 1 puff into the lungs in the morning and at bedtime.    Dispense:  3 each    Refill:  3

## 2022-05-29 ENCOUNTER — Other Ambulatory Visit: Payer: Self-pay | Admitting: Family Medicine

## 2022-05-29 DIAGNOSIS — I872 Venous insufficiency (chronic) (peripheral): Secondary | ICD-10-CM

## 2022-05-29 DIAGNOSIS — I10 Essential (primary) hypertension: Secondary | ICD-10-CM

## 2022-06-13 ENCOUNTER — Other Ambulatory Visit: Payer: Self-pay | Admitting: Family Medicine

## 2022-06-13 DIAGNOSIS — J4520 Mild intermittent asthma, uncomplicated: Secondary | ICD-10-CM

## 2022-07-06 ENCOUNTER — Other Ambulatory Visit: Payer: Self-pay | Admitting: Family Medicine

## 2022-07-06 DIAGNOSIS — J4520 Mild intermittent asthma, uncomplicated: Secondary | ICD-10-CM

## 2022-10-02 ENCOUNTER — Other Ambulatory Visit: Payer: Self-pay | Admitting: Family Medicine

## 2022-10-02 DIAGNOSIS — J4520 Mild intermittent asthma, uncomplicated: Secondary | ICD-10-CM

## 2022-10-25 ENCOUNTER — Other Ambulatory Visit: Payer: Self-pay | Admitting: Family Medicine

## 2022-10-25 DIAGNOSIS — J4520 Mild intermittent asthma, uncomplicated: Secondary | ICD-10-CM

## 2022-11-24 ENCOUNTER — Other Ambulatory Visit: Payer: Self-pay | Admitting: Family Medicine

## 2022-11-24 DIAGNOSIS — J4520 Mild intermittent asthma, uncomplicated: Secondary | ICD-10-CM

## 2022-11-25 ENCOUNTER — Other Ambulatory Visit: Payer: Self-pay | Admitting: Family Medicine

## 2022-11-25 DIAGNOSIS — J4520 Mild intermittent asthma, uncomplicated: Secondary | ICD-10-CM

## 2022-12-20 ENCOUNTER — Other Ambulatory Visit: Payer: Self-pay | Admitting: Family Medicine

## 2022-12-20 DIAGNOSIS — J4520 Mild intermittent asthma, uncomplicated: Secondary | ICD-10-CM

## 2023-01-02 NOTE — Progress Notes (Unsigned)
[facility] [address] [place], [place] [address] [phone]  Date:  01/03/2023   Name:  Alyssa Nielsen   DOB:  [birth year]   MRN:  [id number]  PCP:  Pearline Cables, MD    Chief Complaint: No chief complaint on file.   History of Present Illness:  Alyssa Nielsen is a 77 y.o. very pleasant female patient who presents with the following:  Patient seen today for follow-up Most recent visit with myself was in March of this year History of osteoporosis, vitamin D deficiency, hypertension, arthritis, breast cancer   In March we had added HCTZ for lower extremity edema, thought due to venous insufficiency She also was feeling somewhat overwhelmed and depressed, helping to care for 2 of her sisters who are in poor health We started her on venlafaxine 37.5  Shingles vaccine Some lab work done in March, BMP, lipid  Bone density scan Mammogram  Patient Active Problem List   Diagnosis Date Noted   Acute pain of right knee 12/04/2020   History of artificial joint 01/01/2014   Osteoarthritis of left knee 12/26/2013   Asthma 03/14/2013   OSTEOPOROSIS 06/22/2011   Psoriasis 06/22/2011   Lower back pain 06/22/2011   Vitamin D deficiency 06/22/2011   DEPRESSION 06/30/2007   Essential hypertension 06/30/2007   ARTHRITIS 06/30/2007   ALLERGIC RHINITIS 01/19/2007   GERD 01/19/2007   BREAST CANCER, HX OF 01/19/2007   ESOPHAGEAL STRICTURE 08/24/2004   DIVERTICULOSIS OF COLON 07/28/2004    Past Medical History:  Diagnosis Date   Allergy    Anxiety    Arthritis 2011 and 2013   spine and L knee   Asthma    Cancer (HCC)    breast cancer   Chicken pox    GERD (gastroesophageal reflux disease)    Glaucoma    Hay fever    Headache(784.0)    Hyperlipidemia    Osteoporosis    Personal history of urinary (tract) infections    Ulcer    STOMACH    Past Surgical History:  Procedure Laterality Date   BREAST  EXCISIONAL BIOPSY Left 1996   BREAST EXCISIONAL BIOPSY Right 1974   BREAST LUMPECTOMY Right 1999   malignant   BREAST SURGERY  1999  1996   R BREAST (CANCEROUS)  L BREAST (BENIGN)      MOUTH SURGERY     WISDOM TEETH REMOVAL   PARTIAL KNEE ARTHROPLASTY Left 12/26/2013   Procedure: UNICOMPARTMENTAL KNEE;  Surgeon: Dannielle Huh, MD;  Location: [facility] OR;  Service: Orthopedics;  Laterality: Left;   TONSILLECTOMY  1954    Social History   Tobacco Use   Smoking status: Former    Current packs/day: 0.00    Types: Cigarettes    Start date: 06/08/1971    Quit date: 06/07/1973    Years since quitting: 49.6   Smokeless tobacco: Never  Substance Use Topics   Alcohol use: No   Drug use: No    Family History  Problem Relation Age of Onset   Hypertension Mother    Heart disease Mother    Stroke Mother        HEMORRAGHIC   Cancer Father 52       PANCREATIC   Hypertension Father    Hypertension Sister    Thyroid disease Sister    Stroke Sister    Hypertension Brother    Colon cancer Cousin     Allergies  Allergen Reactions   Nsaids Other (See Comments)    Pt stated they make her stomach burn   Aspirin Other (See Comments)    GI UPSET-STOMACH BURNS   Ibuprofen Other (See Comments)    STOMACH BURNS SLIGHTLY    Medication list has been reviewed and updated.  Current Outpatient Medications on File Prior to Visit  Medication Sig Dispense Refill   acetaminophen (TYLENOL) 500 MG tablet Take 500 mg by mouth every 8 (eight) hours as needed for moderate pain.     ADVAIR DISKUS 250-50 MCG/ACT AEPB INHALE 1 PUFF INTO THE LUNGS DAILY. RINSE MOUTH AFTER EACH USE 60 each 4   aluminum & magnesium hydroxide-simethicone (MYLANTA) 500-450-40 MG/5ML suspension Take by mouth every 6 (six) hours as needed for indigestion.     Calcium Citrate-Vitamin D (CITRACAL + D PO) Take 1 tablet by mouth daily.     fluticasone-salmeterol (WIXELA INHUB) 250-50 MCG/ACT AEPB Inhale 1 puff into the lungs in the morning  and at bedtime. 3 each 3   hydrochlorothiazide (MICROZIDE) 12.5 MG capsule TAKE 1 CAPSULE BY MOUTH EVERY DAY 90 capsule 2   Ketotifen Fumarate (ALAWAY OP) Place 1 drop into both eyes daily as needed (for allergies / itching).     Latanoprostene Bunod 0.024 % SOLN Apply 1 drop to eye at bedtime.     montelukast (SINGULAIR) 10 MG tablet TAKE 1 TABLET BY MOUTH EVERYDAY AT BEDTIME 30 tablet 0   niacin 500 MG tablet Take 500 mg by mouth daily.     omeprazole (PRILOSEC) 40 MG capsule Take 40 mg by mouth daily.     Polyethyl Glycol-Propyl Glycol (SYSTANE OP) Place 1 drop into both eyes daily as needed (for dry eyes).     venlafaxine XR (EFFEXOR-XR) 37.5 MG 24 hr capsule TAKE 1 CAPSULE BY MOUTH DAILY WITH BREAKFAST. (Patient not taking: Reported on 03/16/2022) 90 capsule 2   No current facility-administered medications on file prior to visit.    Review of Systems:  As per HPI- otherwise negative.   Physical Examination: There were no vitals filed for this visit. There were no vitals filed for this visit. There is no height or weight on file to calculate BMI. Ideal Body Weight:    GEN: no acute distress. HEENT: Atraumatic, Normocephalic.  Ears and Nose: No external deformity. CV: RRR, No M/G/R. No JVD. No thrill. No extra heart sounds. PULM: CTA B, no wheezes, crackles, rhonchi. No retractions. No resp. distress. No accessory muscle use. ABD: S, NT, ND, +BS. No rebound. No HSM. EXTR: No c/c/e PSYCH: Normally interactive. Conversant.    Assessment and Plan: ***  Signed Abbe Amsterdam, MD

## 2023-01-02 NOTE — Patient Instructions (Incomplete)
It was good to see you today!    Recommend COVID booster, flu shot this fall  Recommend shingles vaccine series at your pharmacy if not done already  [facility] to try stopping your singulair for a bit and see if you feel better; since you have been on it for so long and I think it is less likely to be causing the tremor, etc. you have noticed but certainly okay to try stopping it and see what happens   Please try taking the venlafaxine at bedtime for about a month, I hope it may help with your mood and also your pain.  Please let me know what you think  I will get you set up with orthopedics here in [place] to look at your knee and your shoulders  Ordered bone density and mammogram- stop by imaging on the ground floor to set this up

## 2023-01-03 ENCOUNTER — Ambulatory Visit (INDEPENDENT_AMBULATORY_CARE_PROVIDER_SITE_OTHER): Payer: Medicare Other | Admitting: Family Medicine

## 2023-01-03 VITALS — BP 112/62 | HR 62 | Temp 97.6°F | Resp 18 | Ht 59.0 in | Wt 119.8 lb

## 2023-01-03 DIAGNOSIS — R5383 Other fatigue: Secondary | ICD-10-CM

## 2023-01-03 DIAGNOSIS — Z5181 Encounter for therapeutic drug level monitoring: Secondary | ICD-10-CM | POA: Diagnosis not present

## 2023-01-03 DIAGNOSIS — G8929 Other chronic pain: Secondary | ICD-10-CM

## 2023-01-03 DIAGNOSIS — I872 Venous insufficiency (chronic) (peripheral): Secondary | ICD-10-CM | POA: Diagnosis not present

## 2023-01-03 DIAGNOSIS — Z1231 Encounter for screening mammogram for malignant neoplasm of breast: Secondary | ICD-10-CM

## 2023-01-03 DIAGNOSIS — E2839 Other primary ovarian failure: Secondary | ICD-10-CM

## 2023-01-03 DIAGNOSIS — R739 Hyperglycemia, unspecified: Secondary | ICD-10-CM

## 2023-01-03 DIAGNOSIS — M25511 Pain in right shoulder: Secondary | ICD-10-CM

## 2023-01-04 NOTE — Addendum Note (Signed)
Addended by: Abbe Amsterdam C on: 01/04/2023 11:59 AM   Modules accepted: Orders

## 2023-01-09 ENCOUNTER — Other Ambulatory Visit: Payer: Self-pay | Admitting: Family Medicine

## 2023-01-09 DIAGNOSIS — J4520 Mild intermittent asthma, uncomplicated: Secondary | ICD-10-CM

## 2023-01-10 ENCOUNTER — Ambulatory Visit (HOSPITAL_BASED_OUTPATIENT_CLINIC_OR_DEPARTMENT_OTHER)
Admission: RE | Admit: 2023-01-10 | Discharge: 2023-01-10 | Disposition: A | Discharge status: Home / Self Care | Payer: Medicare Other | Source: Ambulatory Visit | Attending: Family Medicine | Admitting: Family Medicine

## 2023-01-10 ENCOUNTER — Other Ambulatory Visit: Payer: Self-pay | Admitting: Family Medicine

## 2023-01-10 ENCOUNTER — Encounter (HOSPITAL_BASED_OUTPATIENT_CLINIC_OR_DEPARTMENT_OTHER): Payer: Self-pay

## 2023-01-10 DIAGNOSIS — E2839 Other primary ovarian failure: Secondary | ICD-10-CM | POA: Insufficient documentation

## 2023-01-10 DIAGNOSIS — F4323 Adjustment disorder with mixed anxiety and depressed mood: Secondary | ICD-10-CM

## 2023-01-10 DIAGNOSIS — Z1231 Encounter for screening mammogram for malignant neoplasm of breast: Secondary | ICD-10-CM | POA: Insufficient documentation

## 2023-01-12 ENCOUNTER — Telehealth: Payer: Self-pay | Admitting: Family Medicine

## 2023-01-12 ENCOUNTER — Other Ambulatory Visit: Payer: Self-pay | Admitting: Family Medicine

## 2023-01-12 DIAGNOSIS — R928 Other abnormal and inconclusive findings on diagnostic imaging of breast: Secondary | ICD-10-CM

## 2023-01-12 DIAGNOSIS — M810 Age-related osteoporosis without current pathological fracture: Secondary | ICD-10-CM

## 2023-01-12 MED ORDER — ALENDRONATE SODIUM 70 MG PO TABS
70.0000 mg | ORAL_TABLET | ORAL | 3 refills | Status: DC
Start: 2023-01-12 — End: 2023-12-07

## 2023-01-12 NOTE — Telephone Encounter (Signed)
Called her to discuss dexa  She was on fosamax in the past but stopped taking it a few years ago for no reason in particular She has gone from T -2.6 in 2020 She is willing to go back on fosamax- no SE that she can recall   MM 3D SCREENING MAMMOGRAM BILATERAL BREAST  Result Date: 01/11/2023 CLINICAL DATA:  Screening. EXAM: DIGITAL SCREENING BILATERAL MAMMOGRAM WITH TOMOSYNTHESIS AND CAD TECHNIQUE: Bilateral screening digital craniocaudal and mediolateral oblique mammograms were obtained. Bilateral screening digital breast tomosynthesis was performed. The images were evaluated with computer-aided detection. COMPARISON:  Previous exam(s). ACR Breast Density Category c: The breasts are heterogeneously dense, which may obscure small masses. FINDINGS: In the left breast, calcifications warrant further evaluation. In the right breast, no findings suspicious for malignancy. IMPRESSION: Further evaluation is suggested for calcifications in the left breast. RECOMMENDATION: Diagnostic mammogram of the left breast. (Code:[id number]) The patient will be contacted regarding the findings, and additional imaging will be scheduled. BI-RADS CATEGORY  0: Incomplete: Need additional imaging evaluation. Electronically Signed   By: Alyssa Ales M.D.   On: 01/11/2023 16:28   DG Bone Density  Result Date: 01/10/2023 EXAM: DUAL X-RAY ABSORPTIOMETRY (DXA) FOR BONE MINERAL DENSITY IMPRESSION: Alyssa Nielsen C Shawnay Bramel Your patient Alyssa Nielsen completed a BMD test on 01/10/2023 using the Lunar IDXA DXA System (analysis version: 16.SP2) manufactured by [employer]. The following summarizes the results of our evaluation. [facility] PATIENT: Name: Alyssa Nielsen, Alyssa Nielsen Patient ID: [id number] Birth Date: [birth year] Height: 58.0 in. Gender: Female Measured: 01/10/2023 Weight: 118.6 lbs. Indications: Advanced Age, Breast Cancer, Caucasian, Chemotherapy for Cancer, Estrogen Deficiency, Family Hx of Osteoporosis, Height Loss, Post Menopausal, Previous Tobacco  User Fractures: Wrist Treatments: Calcium, Estrogen, Fosamax(Alendronate), Vitamin D ASSESSMENT: The BMD measured at Forearm Radius 33% is 0.559 g/cm2 with a T-score of -3.6. This patient is considered osteoporotic according to World Health Organization [facility]) criteria. Lumbar spine was not utilized due to advanced degenerative changes. Compared with the prior study on, 09/08/2016 the BMD of the total mean shows a statistically significant decrease. The scan quality is good. Site Region Measured Date Measured Age WHO YA BMD Classification T-score DualFemur Total Mean 01/10/2023 78.3 Osteoporosis -2.9 0.643 g/cm2 DualFemur Total Mean 09/08/2016 72.0 Osteoporosis -2.5 0.691 g/cm2 Left Forearm Radius 33% 01/10/2023 78.3 Osteoporosis -3.6 0.559 g/cm2 Left Forearm Radius 33% 09/08/2016 72.0 Low Bone Mass -2.1 0.694 g/cm2 World Health Organization [facility]) criteria for post-menopausal, Caucasian Women: Normal        T-score at or above -1 SD Low Bone Mass T-score between -1 and -2.5 SD Osteoporosis  T-score at or below -2.5 SD RECOMMENDATION: 1. All patients should optimize calcium and vitamin D intake. 2. Consider FDA-approved medical therapies in postmenopausal women and men aged 42 years and older, based on the following: a. A hip or vertebral(clinical or morphometric) fracture. b. T-Score < -2.5 at the femoral neck or spine after appropriate evaluation to exclude secondary causes c. Low bone mass (T-score between -1.0 and -2.5 at the femoral neck or spine) and a 10 year probability of a hip fracture >3% or a 10 year probability of major osteoporosis-related fracture > 20% based on the [place]-adapted WHO algorithm d. Clinical judgement and/or patient preferences may indicate treatment for people with 10-year fracture probabilities above or below these levels FOLLOW-UP: Patients with diagnosis of osteoporosis or at high risk for fracture should have regular bone mineral density tests. For patients eligible for Medicare, routine  testing is allowed once every 2 years. The  testing frequency can be increased to one year for patients who have rapidly progressing disease, those who are receiving or discontinuing medical therapy to restore bone mass, or have additional risk factors. I have reviewed this report, and agree with the above findings. [facility] Radiology Electronically Signed   By: Baird Lyons M.D.   On: 01/10/2023 13:35

## 2023-01-13 ENCOUNTER — Other Ambulatory Visit: Payer: Self-pay | Admitting: Family Medicine

## 2023-01-13 DIAGNOSIS — F4323 Adjustment disorder with mixed anxiety and depressed mood: Secondary | ICD-10-CM

## 2023-01-20 ENCOUNTER — Ambulatory Visit
Admission: RE | Admit: 2023-01-20 | Discharge: 2023-01-20 | Disposition: A | Discharge status: Home / Self Care | Payer: Medicare Other | Source: Ambulatory Visit | Attending: Family Medicine | Admitting: Family Medicine

## 2023-01-20 DIAGNOSIS — R928 Other abnormal and inconclusive findings on diagnostic imaging of breast: Secondary | ICD-10-CM

## 2023-01-21 ENCOUNTER — Other Ambulatory Visit: Payer: Self-pay | Admitting: Family Medicine

## 2023-01-21 DIAGNOSIS — R921 Mammographic calcification found on diagnostic imaging of breast: Secondary | ICD-10-CM

## 2023-01-26 ENCOUNTER — Ambulatory Visit
Admission: RE | Admit: 2023-01-26 | Discharge: 2023-01-26 | Disposition: A | Discharge status: Home / Self Care | Payer: Medicare Other | Source: Ambulatory Visit | Attending: Family Medicine | Admitting: Family Medicine

## 2023-01-26 ENCOUNTER — Ambulatory Visit: Payer: Medicare Other

## 2023-01-26 DIAGNOSIS — R921 Mammographic calcification found on diagnostic imaging of breast: Secondary | ICD-10-CM

## 2023-01-26 HISTORY — PX: BREAST BIOPSY: SHX20

## 2023-01-30 ENCOUNTER — Other Ambulatory Visit: Payer: Self-pay | Admitting: Family Medicine

## 2023-01-30 DIAGNOSIS — F4323 Adjustment disorder with mixed anxiety and depressed mood: Secondary | ICD-10-CM

## 2023-01-31 ENCOUNTER — Encounter: Payer: Self-pay | Admitting: Physician Assistant

## 2023-01-31 ENCOUNTER — Ambulatory Visit (INDEPENDENT_AMBULATORY_CARE_PROVIDER_SITE_OTHER): Payer: Medicare Other | Admitting: Physician Assistant

## 2023-01-31 ENCOUNTER — Other Ambulatory Visit (INDEPENDENT_AMBULATORY_CARE_PROVIDER_SITE_OTHER): Payer: Medicare Other

## 2023-01-31 VITALS — BP 136/82 | HR 90 | Temp 98.4°F | Resp 20 | Ht 59.0 in | Wt 117.0 lb

## 2023-01-31 DIAGNOSIS — E876 Hypokalemia: Secondary | ICD-10-CM | POA: Diagnosis not present

## 2023-01-31 DIAGNOSIS — I471 Supraventricular tachycardia, unspecified: Secondary | ICD-10-CM

## 2023-01-31 NOTE — Progress Notes (Signed)
Established patient visit   Patient: Alyssa Nielsen   DOB: Mar 20, 1946   77 y.o. Female  MRN: [id number] Visit Date: 01/31/2023  Today's healthcare provider: Alfredia Ferguson, PA-C   Cc. Heart palpitations, ED f/u  Subjective    HPI  Pt was seen in the ED 01/22/23 for palpations, found to have SVT with a HR in the 200s. This spontaneously converted to normal sinus.  Pt was found to have mildly low potassium corrected in the ED and sent home w/ an rx.  She was started on metoprolol 12.5 mg.  Pt denies being started on a heart monitor from her ED visit. Reports she is tolerating medication well and denies any recurrent symptoms.  Medications: Outpatient Medications Prior to Visit  Medication Sig   acetaminophen (TYLENOL) 650 MG suppository Place 650 mg rectally every 4 (four) hours as needed.   alendronate (FOSAMAX) 70 MG tablet Take 1 tablet (70 mg total) by mouth every 7 (seven) days. Take with a full glass of water on an empty stomach.   aluminum & magnesium hydroxide-simethicone (MYLANTA) 500-450-40 MG/5ML suspension Take by mouth every 6 (six) hours as needed for indigestion.   Calcium Citrate-Vitamin D (CITRACAL + D PO) Take 1 tablet by mouth daily.   diclofenac Sodium (VOLTAREN) 1 % GEL Apply topically.   dorzolamide-timolol (COSOPT) 2-0.5 % ophthalmic solution Place 1 drop into the left eye every morning.   fluticasone-salmeterol (WIXELA INHUB) 250-50 MCG/ACT AEPB Inhale 1 puff into the lungs in the morning and at bedtime.   hydrochlorothiazide (MICROZIDE) 12.5 MG capsule TAKE 1 CAPSULE BY MOUTH EVERY DAY   Ketotifen Fumarate (ALAWAY OP) Place 1 drop into both eyes daily as needed (for allergies / itching).   Latanoprostene Bunod 0.024 % SOLN Apply 1 drop to eye at bedtime.   metoprolol succinate (TOPROL-XL) 25 MG 24 hr tablet Take 0.5 tablets by mouth daily.   montelukast (SINGULAIR) 10 MG tablet TAKE 1 TABLET BY MOUTH EVERYDAY AT BEDTIME   niacin 500 MG tablet Take 500  mg by mouth daily.   Polyethyl Glycol-Propyl Glycol (SYSTANE OP) Place 1 drop into both eyes daily as needed (for dry eyes).   venlafaxine XR (EFFEXOR-XR) 37.5 MG 24 hr capsule Take 1 capsule (37.5 mg total) by mouth daily with breakfast.   [DISCONTINUED] ADVAIR DISKUS 250-50 MCG/ACT AEPB INHALE 1 PUFF INTO THE LUNGS DAILY. RINSE MOUTH AFTER EACH USE   [DISCONTINUED] diclofenac (VOLTAREN) 50 MG EC tablet Take 50 mg by mouth 2 (two) times daily.   [DISCONTINUED] omeprazole (PRILOSEC) 40 MG capsule Take 40 mg by mouth daily.   No facility-administered medications prior to visit.    Review of Systems  Constitutional:  Negative for fatigue and fever.  Respiratory:  Negative for cough and shortness of breath.   Cardiovascular:  Negative for chest pain and leg swelling.  Gastrointestinal:  Negative for abdominal pain.  Neurological:  Negative for dizziness and headaches.      Objective    BP 136/82 (BP Location: Left Arm, Patient Position: Sitting, Cuff Size: Normal)   Pulse 90   Temp 98.4 F (36.9 C) (Oral)   Resp 20   Ht 4\' 11"  (1.499 m)   Wt 117 lb (53.1 kg)   SpO2 98%   BMI 23.63 kg/m   Physical Exam Constitutional:      General: She is awake.     Appearance: She is well-developed.  HENT:     Head: Normocephalic.  Eyes:  Conjunctiva/sclera: Conjunctivae normal.  Cardiovascular:     Rate and Rhythm: Normal rate and regular rhythm.     Heart sounds: Normal heart sounds.  Pulmonary:     Effort: Pulmonary effort is normal.     Breath sounds: Normal breath sounds.  Skin:    General: Skin is warm.  Neurological:     Mental Status: She is alert and oriented to person, place, and time.  Psychiatric:        Attention and Perception: Attention normal.        Mood and Affect: Mood normal.        Speech: Speech normal.        Behavior: Behavior is cooperative.      No results found for any visits on 01/31/23.  Assessment & Plan     1. Hypokalemia Repeat cmp  - Comp  Met (CMET)  2. SVT (supraventricular tachycardia) Cont metoprolol Advised some at-home techniques to help with rhythm conversion if symptoms recur, pt aware of when/if she needs to return to ED   Referring to cardiology for holter monitor - Ambulatory referral to Cardiology   Return if symptoms worsen or fail to improve.     I, Alfredia Ferguson, PA-C have reviewed all documentation for this visit. The documentation on  01/31/23   for the exam, diagnosis, procedures, and orders are all accurate and complete.    Alfredia Ferguson, PA-C  [facility] [phone] (phone) [phone] (fax)  [facility]

## 2023-02-01 ENCOUNTER — Telehealth: Payer: Self-pay | Admitting: Family Medicine

## 2023-02-01 LAB — COMPREHENSIVE METABOLIC PANEL
ALT: 18 U/L (ref 0–35)
AST: 19 U/L (ref 0–37)
Albumin: 4 g/dL (ref 3.5–5.2)
Alkaline Phosphatase: 101 U/L (ref 39–117)
BUN: 14 mg/dL (ref 6–23)
CO2: 31 meq/L (ref 19–32)
Calcium: 10 mg/dL (ref 8.4–10.5)
Chloride: 101 meq/L (ref 96–112)
Creatinine, Ser: 0.67 mg/dL (ref 0.40–1.20)
GFR: 83.71 mL/min (ref >60.00)
Glucose, Bld: 103 mg/dL — ABNORMAL HIGH (ref 70–99)
Potassium: 3.9 meq/L (ref 3.5–5.1)
Sodium: 141 meq/L (ref 135–145)
Total Bilirubin: 0.5 mg/dL (ref 0.2–1.2)
Total Protein: 6.4 g/dL (ref 6.0–8.3)

## 2023-02-01 NOTE — Telephone Encounter (Signed)
Pt called & would like for nurse to discuss her recent lab results via telephone. Please call and advise pt.

## 2023-02-02 ENCOUNTER — Other Ambulatory Visit: Payer: Self-pay | Admitting: Family Medicine

## 2023-02-02 DIAGNOSIS — F4323 Adjustment disorder with mixed anxiety and depressed mood: Secondary | ICD-10-CM

## 2023-02-02 LAB — PTH, INTACT AND CALCIUM
Calcium: 9.9 mg/dL (ref 8.6–10.4)
PTH: 50 pg/mL (ref 16–77)

## 2023-02-02 NOTE — Telephone Encounter (Signed)
Pearline Cables, MD 02/02/2023  2:07 PM EDT Back to Top    Called pt and LMOM with results

## 2023-02-08 ENCOUNTER — Telehealth: Payer: Self-pay | Admitting: Family Medicine

## 2023-02-08 NOTE — Telephone Encounter (Signed)
Pt called & stated that per [facility], they need approval from Dr. Patsy Lager to refill her prescription for venlafaxine XR (EFFEXOR-XR) 37.5 MG 24 hr capsule. Please call & advise.

## 2023-02-11 ENCOUNTER — Other Ambulatory Visit: Payer: Self-pay

## 2023-02-11 DIAGNOSIS — F4323 Adjustment disorder with mixed anxiety and depressed mood: Secondary | ICD-10-CM

## 2023-02-11 MED ORDER — VENLAFAXINE HCL ER 37.5 MG PO CP24
37.5000 mg | ORAL_CAPSULE | Freq: Every day | ORAL | 0 refills | Status: DC
Start: 2023-02-11 — End: 2023-05-09

## 2023-02-11 NOTE — Telephone Encounter (Signed)
Rx was sent in. 

## 2023-02-21 ENCOUNTER — Telehealth: Payer: Self-pay | Admitting: Family Medicine

## 2023-02-21 NOTE — Telephone Encounter (Signed)
Pt requesting refill for metoprolol xl 25 mg to be sent to [facility] in [place].

## 2023-02-22 ENCOUNTER — Other Ambulatory Visit: Payer: Self-pay

## 2023-02-22 DIAGNOSIS — I471 Supraventricular tachycardia, unspecified: Secondary | ICD-10-CM

## 2023-02-22 MED ORDER — METOPROLOL SUCCINATE ER 25 MG PO TB24: 12.5000 mg | ORAL_TABLET | Freq: Every day | ORAL | 0 refills | Status: DC

## 2023-02-22 NOTE — Telephone Encounter (Signed)
Rx has been sent

## 2023-03-07 ENCOUNTER — Other Ambulatory Visit: Payer: Self-pay

## 2023-03-07 DIAGNOSIS — Z87440 Personal history of urinary (tract) infections: Secondary | ICD-10-CM | POA: Insufficient documentation

## 2023-03-07 DIAGNOSIS — T7840XA Allergy, unspecified, initial encounter: Secondary | ICD-10-CM | POA: Insufficient documentation

## 2023-03-07 DIAGNOSIS — E785 Hyperlipidemia, unspecified: Secondary | ICD-10-CM | POA: Insufficient documentation

## 2023-03-07 DIAGNOSIS — H409 Unspecified glaucoma: Secondary | ICD-10-CM | POA: Insufficient documentation

## 2023-03-07 DIAGNOSIS — J301 Allergic rhinitis due to pollen: Secondary | ICD-10-CM | POA: Insufficient documentation

## 2023-03-07 DIAGNOSIS — C801 Malignant (primary) neoplasm, unspecified: Secondary | ICD-10-CM | POA: Insufficient documentation

## 2023-03-07 DIAGNOSIS — B019 Varicella without complication: Secondary | ICD-10-CM | POA: Insufficient documentation

## 2023-03-07 DIAGNOSIS — F419 Anxiety disorder, unspecified: Secondary | ICD-10-CM | POA: Insufficient documentation

## 2023-03-10 ENCOUNTER — Ambulatory Visit: Payer: Medicare Other

## 2023-03-10 VITALS — BP 132/76 | HR 77 | Ht 59.0 in | Wt 118.0 lb

## 2023-03-10 DIAGNOSIS — I471 Supraventricular tachycardia, unspecified: Secondary | ICD-10-CM | POA: Diagnosis not present

## 2023-03-10 DIAGNOSIS — I10 Essential (primary) hypertension: Secondary | ICD-10-CM | POA: Diagnosis present

## 2023-03-10 NOTE — Assessment & Plan Note (Signed)
Paroxysmal SVT episode, as per ER note consistent with AVNRT.  I do not have the tracings to confirm myself. Another episode short lasting for few minutes at home in the past 6 weeks.  Currently on metoprolol succinate 12.5 mg once daily, tolerating well.  Advised her to keep herself well-hydrated. Advised her to sit down or lay down at the onset of any symptoms and try vagal maneuvers.  Will obtain a transthoracic echocardiogram to rule out any structural and functional issues. Will also obtain a 4-week event monitor, if we are able to capture any of these episodes, can further review with EP consult depending on burden of these arrhythmias to assess candidacy for antiarrhythmics versus ablation.

## 2023-03-10 NOTE — Progress Notes (Signed)
Cardiology Consultation:    Date:  03/10/2023   ID:  Alyssa Nielsen, DOB [birth year], MRN [id number]  PCP:  Pearline Cables, MD  Cardiologist:  Alyssa Corporal Valentina Alcoser, MD   Referring MD: Alfredia Ferguson, PA-C   No chief complaint on file. SVT   ASSESSMENT AND PLAN:   [name] 77 year old woman with history of breast cancer s/p mastectomy and chemoradiation back in 1999, hypertension, osteoporosis recently diagnosed with paroxysmal SVT requiring ER visit on January 22, 2023.  Since then has been on low-dose metoprolol succinate, with a brief episode of palpitations lasting couple minutes.  Here to establish care.   Problem List Items Addressed This Visit     Essential hypertension - Primary    Well-controlled target blood pressure below 140 over 90 mmHg given her elderly age. She is currently on hydrochlorothiazide 12.5 mg once daily and metoprolol succinate 12.5 mg once daily that was recently started for SVT. Blood pressures within good control. Continue current medications.      Relevant Orders   EKG 12-Lead (Completed)   ECHOCARDIOGRAM COMPLETE   SVT (supraventricular tachycardia) (HCC)    Paroxysmal SVT episode, as per ER note consistent with AVNRT.  I do not have the tracings to confirm myself. Another episode short lasting for few minutes at home in the past 6 weeks.  Currently on metoprolol succinate 12.5 mg once daily, tolerating well.  Advised her to keep herself well-hydrated. Advised her to sit down or lay down at the onset of any symptoms and try vagal maneuvers.  Will obtain a transthoracic echocardiogram to rule out any structural and functional issues. Will also obtain a 4-week event monitor, if we are able to capture any of these episodes, can further review with EP consult depending on burden of these arrhythmias to assess candidacy for antiarrhythmics versus ablation.      Relevant Orders   Cardiac event monitor   ECHOCARDIOGRAM COMPLETE   Return to  clinic in 6 months or as needed.   History of Present Illness:    Alyssa Nielsen is a 77 y.o. female who is being seen today for the evaluation of SVT at the request of Drubel, Lillia Abed, PA-C.  Is an woman here for the visit by herself. Has history of hypertension, osteoporosis, right breast cancer s/p mastectomy and chemoradiation back in 1999. No prior cardiac history.  Lives by herself at home.  Good baseline functional status.  She was recently in the emergency room August 17 at [facility] for episodes of palpitations, documented by EMS with heart rates in 200s rhythm consistent with SVT that spontaneously converted to sinus rhythm.  ER stay was notable for mildly low potassium levels at 3.2 the rhythm strip and conversion was apparently noted by EMS rhythm strips and confirmed by the ED physician.  Do not have these strips to review myself.  She was started on low-dose beta-blockers and discharged to follow-up with cardiology as an outpatient.  Describes the episode occurring at night woke her up from sleep heart rates were elevated up to 200s with her pulse ox, she called EMS by the time she got settled and then ready to start to the ER in the ambulance her heart rate settled down.  She has been taking metoprolol succinate 12.5 mg once daily tolerating well.  She reports a brief episode of palpitation in the last month and a half, lasting couple minutes.  Denies any chest pain, orthopnea, paroxysmal nocturnal dyspnea.  Denies any syncopal episodes. For blood pressure she has been chronically on low-dose hydrochlorothiazide 12.5 mg once daily and tolerating well.  EKG in the clinic today shows sinus rhythm heart rate 77/min, PR interval 122 ms, QRS duration 86 ms.  QTc normal 425 ms.   Blood work from ER visit January 22, 2023 noted hemoglobin 13.6, hematocrit 40.2, WBC 7, platelets 310 Sodium 141, potassium 3.2, BUN 16, creatinine 0.63, EGFR greater than  90 Normal transaminases and alkaline phosphatase Recent TSH levels checked from 2 months ago was 1.66, normal  Hemoglobin A1c 5.5 on January 03, 2023. Last lipid panel from March 2023 noted total cholesterol 245, LDL 110, HDL 119, triglycerides 75    Past Medical History:  Diagnosis Date   Acute pain of right knee 12/04/2020   ALLERGIC RHINITIS 01/19/2007   Qualifier: Diagnosis of   By: Durwin Nora      Replacing diagnoses that were inactivated after the 09/06/22 regulatory import     Allergy    Anxiety    ARTHRITIS 06/30/2007   Qualifier: Diagnosis of   By: Misty Stanley CMA (AAMA), Marchelle Folks      Replacing diagnoses that were inactivated after the 09/06/22 regulatory import     Asthma    BREAST CANCER, HX OF 01/19/2007   Qualifier: Diagnosis of   By: Durwin Nora         Cancer [facility])    breast cancer   Chicken pox    Chronic throat clearing 08/05/2021   Last Assessment & Plan:    Emergency department follow-up for acute exacerbation of a chronic problem with significant throat awareness.   Has been present for years.  She was seen over 5 years ago for similar symptoms.  On her most recent visit to the emergency department, she was having intense throat awareness.  Flexible laryngoscopy in the emergency department revealed no lesions, masses or    DIVERTICULOSIS OF COLON 07/28/2004   Qualifier: Diagnosis of   By: Misty Stanley CMA (AAMA), Amanda      Replacing diagnoses that were inactivated after the 09/06/22 regulatory import     ESOPHAGEAL STRICTURE 08/24/2004   Qualifier: Diagnosis of   By: Misty Stanley CMA (AAMA), Amanda         Essential hypertension 06/30/2007   Qualifier: Diagnosis of   By: Misty Stanley CMA (AAMA), Amanda         GERD 01/19/2007   Qualifier: Diagnosis of   By: Durwin Nora         Glaucoma    Hay fever    History of artificial joint 01/01/2014   Hyperlipidemia    Lower back pain 06/22/2011   Osteoarthritis of left knee 12/26/2013   Personal history of urinary (tract)  infections    Psoriasis 06/22/2011   Senile nuclear sclerosis 04/08/2017   Vitamin D deficiency 06/22/2011    Past Surgical History:  Procedure Laterality Date   BREAST BIOPSY Left 01/26/2023   MM LT BREAST BX W LOC DEV 1ST LESION IMAGE BX SPEC STEREO GUIDE 01/26/2023 [facility]   BREAST EXCISIONAL BIOPSY Left 1996   BREAST EXCISIONAL BIOPSY Right 1974   BREAST LUMPECTOMY Right 1999   malignant   BREAST SURGERY  1999  1996   R BREAST (CANCEROUS)  L BREAST (BENIGN)      MOUTH SURGERY     WISDOM TEETH REMOVAL   PARTIAL KNEE ARTHROPLASTY Left 12/26/2013   Procedure: UNICOMPARTMENTAL KNEE;  Surgeon: Dannielle Huh, MD;  Location: [facility] OR;  Service: Orthopedics;  Laterality:  Left;   TONSILLECTOMY  1954    Current Medications: Current Meds  Medication Sig   alendronate (FOSAMAX) 70 MG tablet Take 1 tablet (70 mg total) by mouth every 7 (seven) days. Take with a full glass of water on an empty stomach.   aluminum & magnesium hydroxide-simethicone (MYLANTA) 500-450-40 MG/5ML suspension Take by mouth every 6 (six) hours as needed for indigestion.   Calcium Citrate-Vitamin D (CITRACAL + D PO) Take 1 tablet by mouth daily.   diclofenac Sodium (VOLTAREN) 1 % GEL Apply 1 Application topically as needed (pain).   dorzolamide-timolol (COSOPT) 2-0.5 % ophthalmic solution Place 1 drop into the left eye every morning.   fluticasone-salmeterol (WIXELA INHUB) 250-50 MCG/ACT AEPB Inhale 1 puff into the lungs in the morning and at bedtime.   hydrochlorothiazide (MICROZIDE) 12.5 MG capsule TAKE 1 CAPSULE BY MOUTH EVERY DAY   Latanoprostene Bunod 0.024 % SOLN Apply 1 drop to eye at bedtime.   metoprolol succinate (TOPROL-XL) 25 MG 24 hr tablet Take 0.5 tablets (12.5 mg total) by mouth daily.   montelukast (SINGULAIR) 10 MG tablet TAKE 1 TABLET BY MOUTH EVERYDAY AT BEDTIME   niacin 500 MG tablet Take 500 mg by mouth daily.   Polyethyl Glycol-Propyl Glycol (SYSTANE OP) Place 1 drop into both eyes daily as  needed (for dry eyes).     Allergies:   Nsaids, Aspirin, and Ibuprofen   Social History   Socioeconomic History   Marital status: Single    Spouse name: Not on file   Number of children: Not on file   Years of education: Not on file   Highest education level: Not on file  Occupational History   Not on file  Tobacco Use   Smoking status: Former    Current packs/day: 0.00    Types: Cigarettes    Start date: 06/08/1971    Quit date: 06/07/1973    Years since quitting: 49.7   Smokeless tobacco: Never  Substance and Sexual Activity   Alcohol use: No   Drug use: No   Sexual activity: Never  Other Topics Concern   Not on file  Social History Narrative   Not on file   Social Determinants of Health   Financial Resource Strain: Not on file  Food Insecurity: Not on file  Transportation Needs: No Transportation Needs (03/16/2022)   PRAPARE - Administrator, Civil Service (Medical): No    Lack of Transportation (Non-Medical): No  Physical Activity: Not on file  Stress: Stress Concern Present (03/16/2022)   [employer] of Occupational Health - Occupational Stress Questionnaire    Feeling of Stress : Rather much  Social Connections: Not on file     Family History: The patient's family history includes Cancer (age of onset: 52) in her father; Colon cancer in her cousin; Heart disease in her mother; Hypertension in her brother, father, mother, and sister; Stroke in her mother and sister; Thyroid disease in her sister. ROS:   Please see the history of present illness.    All 14 point review of systems negative except as described per history of present illness.  EKGs/Labs/Other Studies Reviewed:    The following studies were reviewed today:   EKG:  EKG Interpretation Date/Time:  Thursday March 10 2023 10:46:39 EDT Ventricular Rate:  77 PR Interval:  122 QRS Duration:  86 QT Interval:  376 QTC Calculation: 425 R Axis:   53  Text Interpretation: Normal  sinus rhythm Normal ECG No previous ECGs available Confirmed  by Huntley Dec reddy [phone]) on 03/10/2023 10:58:35 AM    Recent Labs: 01/03/2023: Hemoglobin 13.0; Platelets 345.0; TSH 1.66 01/31/2023: ALT 18; BUN 14; Creatinine, Ser 0.67; Potassium 3.9; Sodium 141  Recent Lipid Panel    Component Value Date/Time   CHOL 245 (H) 08/17/2021 1111   CHOL 254 (H) 05/12/2016 1224   TRIG 75.0 08/17/2021 1111   HDL 119.70 08/17/2021 1111   HDL 118 05/12/2016 1224   CHOLHDL 2 08/17/2021 1111   VLDL 15.0 08/17/2021 1111   LDLCALC 110 (H) 08/17/2021 1111   LDLCALC 122 (H) 05/12/2016 1224    Physical Exam:    VS:  BP 132/76   Pulse 77   Ht 4\' 11"  (1.499 m)   Wt 118 lb (53.5 kg)   SpO2 95%   BMI 23.83 kg/m     Wt Readings from Last 3 Encounters:  03/10/23 118 lb (53.5 kg)  01/31/23 117 lb (53.1 kg)  01/03/23 119 lb 12.8 oz (54.3 kg)     GENERAL:  Well nourished, well developed in no acute distress NECK: No JVD; No carotid bruits CARDIAC: RRR, S1 and S2 present, no murmurs, no rubs, no gallops CHEST:  Clear to auscultation without rales, wheezing or rhonchi  Extremities: No pitting pedal edema. Pulses bilaterally symmetric with radial 2+ and dorsalis pedis 2+ NEUROLOGIC:  Alert and oriented x 3  Medication Adjustments/Labs and Tests Ordered: Current medicines are reviewed at length with the patient today.  Concerns regarding medicines are outlined above.  Orders Placed This Encounter  Procedures   Cardiac event monitor   EKG 12-Lead   ECHOCARDIOGRAM COMPLETE   No orders of the defined types were placed in this encounter.   Signed, Isidore Margraf reddy Brysten Reister, MD, MPH, [facility]. 03/10/2023 11:19 AM    [facility]

## 2023-03-10 NOTE — Assessment & Plan Note (Signed)
Well-controlled target blood pressure below 140 over 90 mmHg given her elderly age. She is currently on hydrochlorothiazide 12.5 mg once daily and metoprolol succinate 12.5 mg once daily that was recently started for SVT. Blood pressures within good control. Continue current medications.

## 2023-03-10 NOTE — Patient Instructions (Signed)
Medication Instructions:  Your physician recommends that you continue on your current medications as directed. Please refer to the Current Medication list given to you today.  *If you need a refill on your cardiac medications before your next appointment, please call your pharmacy*   Lab Work: None ordered If you have labs (blood work) drawn today and your tests are completely normal, you will receive your results only by: MyChart Message (if you have MyChart) OR A paper copy in the mail If you have any lab test that is abnormal or we need to change your treatment, we will call you to review the results.   Testing/Procedures: Your physician has requested that you have an echocardiogram. Echocardiography is a painless test that uses sound waves to create images of your heart. It provides your doctor with information about the size and shape of your heart and how well your heart's chambers and valves are working. This procedure takes approximately one hour. There are no restrictions for this procedure. Please do NOT wear cologne, perfume, aftershave, or lotions (deodorant is allowed). Please arrive 15 minutes prior to your appointment time.  Your physician has recommended that you wear an event monitor for 30 days. Event monitors are medical devices that record the heart's electrical activity. Doctors most often [place] these monitors to diagnose arrhythmias. Arrhythmias are problems with the speed or rhythm of the heartbeat. The monitor is a small, portable device. You can wear one while you do your normal daily activities. This is usually used to diagnose what is causing palpitations/syncope (passing out).    Follow-Up: At [facility], you and your health needs are our priority.  As part of our continuing mission to provide you with exceptional heart care, we have created designated Provider Care Teams.  These Care Teams include your primary Cardiologist (physician) and Advanced Practice  Providers (APPs -  Physician Assistants and Nurse Practitioners) who all work together to provide you with the care you need, when you need it.  We recommend signing up for the patient portal called "MyChart".  Sign up information is provided on this After Visit Summary.  MyChart is used to connect with patients for Virtual Visits (Telemedicine).  Patients are able to view lab/test results, encounter notes, upcoming appointments, etc.  Non-urgent messages can be sent to your provider as well.   To learn more about what you can do with MyChart, go to [email or web address].    Your next appointment:   6 month(s)  The format for your next appointment:   In Person  Provider:   Vern Claude Madireddy, MD   Other Instructions Echocardiogram An echocardiogram is a test that uses sound waves (ultrasound) to produce images of the heart. Images from an echocardiogram can provide important information about: Heart size and shape. The size and thickness and movement of your heart's walls. Heart muscle function and strength. Heart valve function or if you have stenosis. Stenosis is when the heart valves are too narrow. If blood is flowing backward through the heart valves (regurgitation). A tumor or infectious growth around the heart valves. Areas of heart muscle that are not working well because of poor blood flow or injury from a heart attack. Aneurysm detection. An aneurysm is a weak or damaged part of an artery wall. The wall bulges out from the normal force of blood pumping through the body. Tell a health care provider about: Any allergies you have. All medicines you are taking, including vitamins, herbs, eye drops, creams, and over-the-counter medicines.  Any blood disorders you have. Any surgeries you have had. Any medical conditions you have. Whether you are pregnant or may be pregnant. What are the risks? Generally, this is a safe test. However, problems may occur, including an allergic  reaction to dye (contrast) that may be used during the test. What happens before the test? No specific preparation is needed. You may eat and drink normally. What happens during the test? You will take off your clothes from the waist up and put on a hospital gown. Electrodes or electrocardiogram (ECG)patches may be placed on your chest. The electrodes or patches are then connected to a device that monitors your heart rate and rhythm. You will lie down on a table for an ultrasound exam. A gel will be applied to your chest to help sound waves pass through your skin. A handheld device, called a transducer, will be pressed against your chest and moved over your heart. The transducer produces sound waves that travel to your heart and bounce back (or "echo" back) to the transducer. These sound waves will be captured in real-time and changed into images of your heart that can be viewed on a video monitor. The images will be recorded on a computer and reviewed by your health care provider. You may be asked to change positions or hold your breath for a short time. This makes it easier to get different views or better views of your heart. In some cases, you may receive contrast through an IV in one of your veins. This can improve the quality of the pictures from your heart. The procedure may vary among health care providers and hospitals.   What can I expect after the test? You may return to your normal, everyday life, including diet, activities, and medicines, unless your health care provider tells you not to do that. Follow these instructions at home: It is up to you to get the results of your test. Ask your health care provider, or the department that is doing the test, when your results will be ready. Keep all follow-up visits. This is important. Summary An echocardiogram is a test that uses sound waves (ultrasound) to produce images of the heart. Images from an echocardiogram can provide important  information about the size and shape of your heart, heart muscle function, heart valve function, and other possible heart problems. You do not need to do anything to prepare before this test. You may eat and drink normally. After the echocardiogram is completed, you may return to your normal, everyday life, unless your health care provider tells you not to do that. This information is not intended to replace advice given to you by your health care provider. Make sure you discuss any questions you have with your health care provider. Document Revised: 01/15/2020 Document Reviewed: 01/15/2020 Elsevier Patient Education  2021 Elsevier Inc.   Important Information About Sugar

## 2023-03-21 ENCOUNTER — Other Ambulatory Visit: Payer: Self-pay

## 2023-03-21 DIAGNOSIS — I471 Supraventricular tachycardia, unspecified: Secondary | ICD-10-CM

## 2023-03-21 DIAGNOSIS — I10 Essential (primary) hypertension: Secondary | ICD-10-CM

## 2023-04-01 ENCOUNTER — Other Ambulatory Visit: Payer: Self-pay | Admitting: Family Medicine

## 2023-04-01 DIAGNOSIS — I872 Venous insufficiency (chronic) (peripheral): Secondary | ICD-10-CM

## 2023-04-01 DIAGNOSIS — I10 Essential (primary) hypertension: Secondary | ICD-10-CM

## 2023-04-14 ENCOUNTER — Ambulatory Visit (HOSPITAL_BASED_OUTPATIENT_CLINIC_OR_DEPARTMENT_OTHER)
Admission: RE | Admit: 2023-04-14 | Discharge: 2023-04-14 | Disposition: A | Discharge status: Home / Self Care | Payer: Medicare Other | Source: Ambulatory Visit

## 2023-04-14 DIAGNOSIS — Z87891 Personal history of nicotine dependence: Secondary | ICD-10-CM | POA: Insufficient documentation

## 2023-04-14 DIAGNOSIS — K219 Gastro-esophageal reflux disease without esophagitis: Secondary | ICD-10-CM | POA: Insufficient documentation

## 2023-04-14 DIAGNOSIS — I471 Supraventricular tachycardia, unspecified: Secondary | ICD-10-CM | POA: Insufficient documentation

## 2023-04-14 DIAGNOSIS — I10 Essential (primary) hypertension: Secondary | ICD-10-CM | POA: Diagnosis not present

## 2023-04-14 DIAGNOSIS — I340 Nonrheumatic mitral (valve) insufficiency: Secondary | ICD-10-CM | POA: Insufficient documentation

## 2023-04-14 DIAGNOSIS — E785 Hyperlipidemia, unspecified: Secondary | ICD-10-CM | POA: Insufficient documentation

## 2023-04-15 LAB — ECHOCARDIOGRAM COMPLETE
AR max vel: 1.63 cm2
AV Area VTI: 1.59 cm2
AV Area mean vel: 1.57 cm2
AV Mean grad: 5 mm[Hg]
AV Peak grad: 8.7 mm[Hg]
Ao pk vel: 1.48 m/s
Area-P 1/2: 3.93 cm2
Calc EF: 58.5 %
MV M vel: 4.52 m/s
MV Peak grad: 81.5 mm[Hg]
P 1/2 time: 4010 ms
S' Lateral: 2.4 cm
Single Plane A2C EF: 61.9 %
Single Plane A4C EF: 59.2 %

## 2023-05-08 ENCOUNTER — Other Ambulatory Visit: Payer: Self-pay | Admitting: Family Medicine

## 2023-05-08 DIAGNOSIS — F4323 Adjustment disorder with mixed anxiety and depressed mood: Secondary | ICD-10-CM

## 2023-05-16 ENCOUNTER — Other Ambulatory Visit: Payer: Self-pay | Admitting: Family Medicine

## 2023-05-16 DIAGNOSIS — I471 Supraventricular tachycardia, unspecified: Secondary | ICD-10-CM

## 2023-05-21 ENCOUNTER — Other Ambulatory Visit: Payer: Self-pay | Admitting: Family Medicine

## 2023-06-22 ENCOUNTER — Other Ambulatory Visit: Payer: Self-pay | Admitting: Family Medicine

## 2023-06-22 DIAGNOSIS — I10 Essential (primary) hypertension: Secondary | ICD-10-CM

## 2023-06-22 DIAGNOSIS — I872 Venous insufficiency (chronic) (peripheral): Secondary | ICD-10-CM

## 2023-07-15 ENCOUNTER — Telehealth: Payer: Self-pay

## 2023-07-15 ENCOUNTER — Other Ambulatory Visit: Payer: Self-pay | Admitting: Family Medicine

## 2023-07-15 DIAGNOSIS — J4520 Mild intermittent asthma, uncomplicated: Secondary | ICD-10-CM

## 2023-07-15 NOTE — Telephone Encounter (Signed)
Monitor Results reviewed with pt as per Dr. Madireddy's note.  Pt verbalized understanding and had no additional questions. Routed to PCP

## 2023-07-15 NOTE — Telephone Encounter (Signed)
 Echo Results reviewed with pt as per Dr. Madireddy's note.  Pt verbalized understanding and had no additional questions. Routed to PCP

## 2023-08-05 ENCOUNTER — Encounter: Payer: Self-pay | Admitting: Family

## 2023-08-05 ENCOUNTER — Ambulatory Visit: Payer: Medicare Other | Admitting: Family

## 2023-08-05 VITALS — BP 132/84 | HR 73 | Ht 59.0 in | Wt 119.0 lb

## 2023-08-05 DIAGNOSIS — R109 Unspecified abdominal pain: Secondary | ICD-10-CM

## 2023-08-05 LAB — CBC WITH DIFFERENTIAL/PLATELET
Basophils Absolute: 0.1 10*3/uL (ref 0.0–0.1)
Basophils Relative: 1.5 % (ref 0.0–3.0)
Eosinophils Absolute: 0 10*3/uL (ref 0.0–0.7)
Eosinophils Relative: 0.7 % (ref 0.0–5.0)
HCT: 40.7 % (ref 36.0–46.0)
Hemoglobin: 13.3 g/dL (ref 12.0–15.0)
Lymphocytes Relative: 15.6 % (ref 12.0–46.0)
Lymphs Abs: 0.9 10*3/uL (ref 0.7–4.0)
MCHC: 32.8 g/dL (ref 30.0–36.0)
MCV: 92.9 fl (ref 78.0–100.0)
Monocytes Absolute: 0.6 10*3/uL (ref 0.1–1.0)
Monocytes Relative: 9.9 % (ref 3.0–12.0)
Neutro Abs: 4.1 10*3/uL (ref 1.4–7.7)
Neutrophils Relative %: 72.3 % (ref 43.0–77.0)
Platelets: 330 10*3/uL (ref 150.0–400.0)
RBC: 4.39 Mil/uL (ref 3.87–5.11)
RDW: 12.9 % (ref 11.5–15.5)
WBC: 5.7 10*3/uL (ref 4.0–10.5)

## 2023-08-05 LAB — COMPREHENSIVE METABOLIC PANEL
ALT: 13 U/L (ref 0–35)
AST: 18 U/L (ref 0–37)
Albumin: 3.9 g/dL (ref 3.5–5.2)
Alkaline Phosphatase: 90 U/L (ref 39–117)
BUN: 14 mg/dL (ref 6–23)
CO2: 29 meq/L (ref 19–32)
Calcium: 9.7 mg/dL (ref 8.4–10.5)
Chloride: 103 meq/L (ref 96–112)
Creatinine, Ser: 0.59 mg/dL (ref 0.40–1.20)
GFR: 86.01 mL/min (ref >60.00)
Glucose, Bld: 108 mg/dL — ABNORMAL HIGH (ref 70–99)
Potassium: 4 meq/L (ref 3.5–5.1)
Sodium: 141 meq/L (ref 135–145)
Total Bilirubin: 0.5 mg/dL (ref 0.2–1.2)
Total Protein: 6.2 g/dL (ref 6.0–8.3)

## 2023-08-05 LAB — AMYLASE: Amylase: 41 U/L (ref 27–131)

## 2023-08-05 LAB — LIPASE: Lipase: 33 U/L (ref 11.0–59.0)

## 2023-08-05 MED ORDER — AMOXICILLIN-POT CLAVULANATE 875-125 MG PO TABS: 1.0000 | ORAL_TABLET | Freq: Two times a day (BID) | ORAL | 0 refills | Status: AC

## 2023-08-05 NOTE — Progress Notes (Signed)
 Alyssa Nielsen is a 78 y.o. female with the following history as recorded in [employer]:  Patient Active Problem List   Diagnosis Date Noted   SVT (supraventricular tachycardia) (HCC) 03/10/2023   Allergy    Anxiety    Cancer (HCC)    Chicken pox    Glaucoma    Hay fever    Hyperlipidemia    Personal history of urinary (tract) infections    Chronic throat clearing 08/05/2021   Acute pain of right knee 12/04/2020   Senile nuclear sclerosis 04/08/2017   History of artificial joint 01/01/2014   Osteoarthritis of left knee 12/26/2013   Asthma 03/14/2013   Osteoporosis 06/22/2011   Psoriasis 06/22/2011   Lower back pain 06/22/2011   Vitamin D deficiency 06/22/2011   DEPRESSION 06/30/2007   Essential hypertension 06/30/2007   ARTHRITIS 06/30/2007   ALLERGIC RHINITIS 01/19/2007   GERD 01/19/2007   BREAST CANCER, HX OF 01/19/2007   ESOPHAGEAL STRICTURE 08/24/2004   DIVERTICULOSIS OF COLON 07/28/2004    Current Outpatient Medications  Medication Sig Dispense Refill   alendronate (FOSAMAX) 70 MG tablet Take 1 tablet (70 mg total) by mouth every 7 (seven) days. Take with a full glass of water on an empty stomach. 12 tablet 3   aluminum & magnesium hydroxide-simethicone (MYLANTA) 500-450-40 MG/5ML suspension Take by mouth every 6 (six) hours as needed for indigestion.     amoxicillin-clavulanate (AUGMENTIN) 875-125 MG tablet Take 1 tablet by mouth 2 (two) times daily for 7 days. 14 tablet 0   Calcium Citrate-Vitamin D (CITRACAL + D PO) Take 1 tablet by mouth daily.     diclofenac Sodium (VOLTAREN) 1 % GEL Apply 1 Application topically as needed (pain).     dorzolamide-timolol (COSOPT) 2-0.5 % ophthalmic solution Place 1 drop into both eyes every morning.     fluticasone-salmeterol (WIXELA INHUB) 250-50 MCG/ACT AEPB INHALE 1 PUFF INTO THE LUNGS IN THE MORNING AND AT BEDTIME. 60 each 2   hydrochlorothiazide (MICROZIDE) 12.5 MG capsule TAKE 1 CAPSULE BY MOUTH EVERY DAY 90 capsule 0    Latanoprostene Bunod 0.024 % SOLN Apply 1 drop to eye at bedtime.     metoprolol succinate (TOPROL-XL) 25 MG 24 hr tablet Take 0.5 tablets (12.5 mg total) by mouth daily. (Patient taking differently: Take 12.5 mg by mouth daily. 1 tablet) 45 tablet 0   montelukast (SINGULAIR) 10 MG tablet Take 1 tablet (10 mg total) by mouth at bedtime. 90 tablet 0   niacin 500 MG tablet Take 500 mg by mouth daily.     Polyethyl Glycol-Propyl Glycol (SYSTANE OP) Place 1 drop into both eyes daily as needed (for dry eyes).     venlafaxine XR (EFFEXOR-XR) 37.5 MG 24 hr capsule Take 1 capsule (37.5 mg total) by mouth daily with breakfast. (Patient not taking: Reported on 08/05/2023) 90 capsule 0   No current facility-administered medications for this visit.    Allergies: Nsaids, Aspirin, and Ibuprofen  Past Medical History:  Diagnosis Date   Acute pain of right knee 12/04/2020   ALLERGIC RHINITIS 01/19/2007   Qualifier: Diagnosis of   By: Durwin Nora      Replacing diagnoses that were inactivated after the 09/06/22 regulatory import     Allergy    Anxiety    ARTHRITIS 06/30/2007   Qualifier: Diagnosis of   By: Misty Stanley CMA (AAMA), Marchelle Folks      Replacing diagnoses that were inactivated after the 09/06/22 regulatory import     Asthma    BREAST CANCER,  HX OF 01/19/2007   Qualifier: Diagnosis of   By: Durwin Nora         Cancer [facility])    breast cancer   Chicken pox    Chronic throat clearing 08/05/2021   Last Assessment & Plan:    Emergency department follow-up for acute exacerbation of a chronic problem with significant throat awareness.   Has been present for years.  She was seen over 5 years ago for similar symptoms.  On her most recent visit to the emergency department, she was having intense throat awareness.  Flexible laryngoscopy in the emergency department revealed no lesions, masses or    DIVERTICULOSIS OF COLON 07/28/2004   Qualifier: Diagnosis of   By: Misty Stanley CMA (AAMA), Amanda      Replacing  diagnoses that were inactivated after the 09/06/22 regulatory import     ESOPHAGEAL STRICTURE 08/24/2004   Qualifier: Diagnosis of   By: Misty Stanley CMA (AAMA), Amanda         Essential hypertension 06/30/2007   Qualifier: Diagnosis of   By: Misty Stanley CMA (AAMA), Amanda         GERD 01/19/2007   Qualifier: Diagnosis of   By: Durwin Nora         Glaucoma    Hay fever    History of artificial joint 01/01/2014   Hyperlipidemia    Lower back pain 06/22/2011   Osteoarthritis of left knee 12/26/2013   Personal history of urinary (tract) infections    Psoriasis 06/22/2011   Senile nuclear sclerosis 04/08/2017   Vitamin D deficiency 06/22/2011    Past Surgical History:  Procedure Laterality Date   BREAST BIOPSY Left 01/26/2023   MM LT BREAST BX W LOC DEV 1ST LESION IMAGE BX SPEC STEREO GUIDE 01/26/2023 [facility]   BREAST EXCISIONAL BIOPSY Left 1996   BREAST EXCISIONAL BIOPSY Right 1974   BREAST LUMPECTOMY Right 1999   malignant   BREAST SURGERY  1999  1996   R BREAST (CANCEROUS)  L BREAST (BENIGN)      MOUTH SURGERY     WISDOM TEETH REMOVAL   PARTIAL KNEE ARTHROPLASTY Left 12/26/2013   Procedure: UNICOMPARTMENTAL KNEE;  Surgeon: Dannielle Huh, MD;  Location: [facility] OR;  Service: Orthopedics;  Laterality: Left;   TONSILLECTOMY  1954    Family History  Problem Relation Age of Onset   Hypertension Mother    Heart disease Mother    Stroke Mother        HEMORRAGHIC   Cancer Father 69       PANCREATIC   Hypertension Father    Hypertension Sister    Thyroid disease Sister    Stroke Sister    Hypertension Brother    Colon cancer Cousin     Social History   Tobacco Use   Smoking status: Former    Current packs/day: 0.00    Types: Cigarettes    Start date: 06/08/1971    Quit date: 06/07/1973    Years since quitting: 50.1   Smokeless tobacco: Never  Substance Use Topics   Alcohol use: No    Subjective:   Patient concerned about episode of abdominal pain that started on Monday;  describes as "sharp sharp pain" and ran a low grade fever of 99.2; + diarrhea; notes that pain is localized in lower abdomen; In reviewing notes, colonoscopy does show history of diverticula;   Objective:  Vitals:   08/05/23 1255  BP: 132/84  Pulse: 73  SpO2: 99%  Weight: 119 lb (54 kg)  Height: 4\' 11"  (1.499 m)    General: Well developed, well nourished, in no acute distress  Skin : Warm and dry.  Head: Normocephalic and atraumatic  Eyes: Sclera and conjunctiva clear; pupils round and reactive to light; extraocular movements intact  Ears: External normal; canals clear; tympanic membranes normal  Oropharynx: Pink, supple. No suspicious lesions  Neck: Supple without thyromegaly, adenopathy  Lungs: Respirations unlabored; clear to auscultation bilaterally without wheeze, rales, rhonchi  CVS exam: normal rate and regular rhythm.  Abdomen: Soft; tender to palpation over LLQ; nondistended; normoactive bowel sounds; no masses or hepatosplenomegaly  Musculoskeletal: No deformities; no active joint inflammation  Extremities: No edema, cyanosis, clubbing  Vessels: Symmetric bilaterally  Neurologic: Alert and oriented; speech intact; face symmetrical; moves all extremities well; CNII-XII intact without focal deficit   Assessment:  1. Abdominal pain, unspecified abdominal location     Plan:  Suspect diverticulitis; unfortunately unable to get CT updated today and will go ahead and start Augmentin to cover; check CBC, CMP, amylase, lipase; order placed for abd/ pelvic CT; strict ER precautions discussed for upcoming weekend; encouraged to follow up with her PCP in about 10 days;   Return in about 10 days (around 08/15/2023) for Dr. Patsy Lager.  Orders Placed This Encounter  Procedures   CT ABDOMEN PELVIS W CONTRAST    Standing Status:   Future    Expiration Date:   08/04/2024    If indicated for the ordered procedure, I authorize the administration of contrast media per Radiology protocol:    Yes    Does the patient have a contrast media/X-ray dye allergy?:   No    Preferred imaging location?:   [facility]    If indicated for the ordered procedure, I authorize the administration of oral contrast media per Radiology protocol:   Yes   CBC with Differential/Platelet   Comp Met (CMET)   Amylase   Lipase    Requested Prescriptions   Signed Prescriptions Disp Refills   amoxicillin-clavulanate (AUGMENTIN) 875-125 MG tablet 14 tablet 0    Sig: Take 1 tablet by mouth 2 (two) times daily for 7 days.

## 2023-08-08 ENCOUNTER — Other Ambulatory Visit: Payer: Self-pay | Admitting: Family Medicine

## 2023-08-08 DIAGNOSIS — I471 Supraventricular tachycardia, unspecified: Secondary | ICD-10-CM

## 2023-08-09 ENCOUNTER — Ambulatory Visit (HOSPITAL_BASED_OUTPATIENT_CLINIC_OR_DEPARTMENT_OTHER)
Admission: RE | Admit: 2023-08-09 | Discharge: 2023-08-09 | Disposition: A | Discharge status: Home / Self Care | Source: Ambulatory Visit | Attending: Family | Admitting: Family

## 2023-08-09 DIAGNOSIS — R109 Unspecified abdominal pain: Secondary | ICD-10-CM | POA: Insufficient documentation

## 2023-08-09 MED ORDER — IOHEXOL 300 MG/ML  SOLN
100.0000 mL | Freq: Once | INTRAMUSCULAR | Status: AC | PRN
  Administered 2023-08-09: 100 mL via INTRAVENOUS

## 2023-08-09 NOTE — Progress Notes (Deleted)
 [facility] [address] [place], [place] [address] [phone]  Date:  Sep 14, 2023   Name:  Alyssa Nielsen   DOB:  1946-01-16   MRN:  [id number]  PCP:  Pearline Cables, MD    Chief Complaint: No chief complaint on file.   History of Present Illness:  Alyssa Nielsen is a 78 y.o. very pleasant female patient who presents with the following:  Most recent visit with myself in July- History of osteoporosis, vitamin D deficiency, hypertension, arthritis, breast cancer  Alyssa Nielsen has a fair amount of stress in her life as she has been responsible for her sister who is in poor health.  She was also taking care of her other sister who passed away in 14-Sep-2022  Patient seen today for short-term follow-up-she was seen by my partner Ria Clock on February 28 with concern of abdominal pain as well as low-grade fever and diarrhea  Vernona Rieger suspected diverticulitis but they were not able to get a CT the same day They started Augmentin and obtain blood work  Lab work from February 28 shows normal CMP, normal white cell count, normal lipase  Flu vaccine this flu season Most recent COVID booster Shingrix is up-to-date Recommend dose of RSV Her mammogram last year showed a suspicious finding which required a biopsy, fortunately biopsy result was negative DEXA scan, colonoscopy up-to-date Patient Active Problem List   Diagnosis Date Noted   SVT (supraventricular tachycardia) (HCC) 03/10/2023   Allergy    Anxiety    Cancer (HCC)    Chicken pox    Glaucoma    Hay fever    Hyperlipidemia    Personal history of urinary (tract) infections    Chronic throat clearing 08/05/2021   Acute pain of right knee 12/04/2020   Senile nuclear sclerosis 04/08/2017   History of artificial joint 01/01/2014   Osteoarthritis of left knee 12/26/2013   Asthma 03/14/2013   Osteoporosis 06/22/2011   Psoriasis 06/22/2011   Lower back pain 06/22/2011    Vitamin D deficiency 06/22/2011   DEPRESSION 06/30/2007   Essential hypertension 06/30/2007   ARTHRITIS 06/30/2007   ALLERGIC RHINITIS 01/19/2007   GERD 01/19/2007   BREAST CANCER, HX OF 01/19/2007   ESOPHAGEAL STRICTURE 08/24/2004   DIVERTICULOSIS OF COLON 07/28/2004    Past Medical History:  Diagnosis Date   Acute pain of right knee 12/04/2020   ALLERGIC RHINITIS 01/19/2007   Qualifier: Diagnosis of   By: Durwin Nora      Replacing diagnoses that were inactivated after the 09/06/22 regulatory import     Allergy    Anxiety    ARTHRITIS 06/30/2007   Qualifier: Diagnosis of   By: Misty Stanley CMA (AAMA), Amanda      Replacing diagnoses that were inactivated after the 09/06/22 regulatory import     Asthma    BREAST CANCER, HX OF 01/19/2007   Qualifier: Diagnosis of   By: Durwin Nora         Cancer [facility])    breast cancer   Chicken pox    Chronic throat clearing 08/05/2021   Last Assessment & Plan:    Emergency department follow-up for acute exacerbation of a chronic problem with significant throat awareness.   Has been present for years.  She was seen over 5 years ago for similar symptoms.  On her most recent visit to the emergency department, she was having intense throat awareness.  Flexible laryngoscopy in the emergency  department revealed no lesions, masses or    DIVERTICULOSIS OF COLON 07/28/2004   Qualifier: Diagnosis of   By: Misty Stanley CMA (AAMA), Amanda      Replacing diagnoses that were inactivated after the 09/06/22 regulatory import     ESOPHAGEAL STRICTURE 08/24/2004   Qualifier: Diagnosis of   By: Misty Stanley CMA (AAMA), Amanda         Essential hypertension 06/30/2007   Qualifier: Diagnosis of   By: Misty Stanley CMA (AAMA), Amanda         GERD 01/19/2007   Qualifier: Diagnosis of   By: Durwin Nora         Glaucoma    Hay fever    History of artificial joint 01/01/2014   Hyperlipidemia    Lower back pain 06/22/2011   Osteoarthritis of left knee 12/26/2013   Personal  history of urinary (tract) infections    Psoriasis 06/22/2011   Senile nuclear sclerosis 04/08/2017   Vitamin D deficiency 06/22/2011    Past Surgical History:  Procedure Laterality Date   BREAST BIOPSY Left 01/26/2023   MM LT BREAST BX W LOC DEV 1ST LESION IMAGE BX SPEC STEREO GUIDE 01/26/2023 [facility]   BREAST EXCISIONAL BIOPSY Left 1996   BREAST EXCISIONAL BIOPSY Right 1974   BREAST LUMPECTOMY Right 1999   malignant   BREAST SURGERY  1999  1996   R BREAST (CANCEROUS)  L BREAST (BENIGN)      MOUTH SURGERY     WISDOM TEETH REMOVAL   PARTIAL KNEE ARTHROPLASTY Left 12/26/2013   Procedure: UNICOMPARTMENTAL KNEE;  Surgeon: Dannielle Huh, MD;  Location: [facility] OR;  Service: Orthopedics;  Laterality: Left;   TONSILLECTOMY  1954    Social History   Tobacco Use   Smoking status: Former    Current packs/day: 0.00    Types: Cigarettes    Start date: 06/08/1971    Quit date: 06/07/1973    Years since quitting: 50.2   Smokeless tobacco: Never  Substance Use Topics   Alcohol use: No   Drug use: No    Family History  Problem Relation Age of Onset   Hypertension Mother    Heart disease Mother    Stroke Mother        HEMORRAGHIC   Cancer Father 8       PANCREATIC   Hypertension Father    Hypertension Sister    Thyroid disease Sister    Stroke Sister    Hypertension Brother    Colon cancer Cousin     Allergies  Allergen Reactions   Nsaids Other (See Comments)    Pt stated they make her stomach burn   Aspirin Other (See Comments)    GI UPSET-STOMACH BURNS   Ibuprofen Other (See Comments)    STOMACH BURNS SLIGHTLY    Medication list has been reviewed and updated.  Current Outpatient Medications on File Prior to Visit  Medication Sig Dispense Refill   alendronate (FOSAMAX) 70 MG tablet Take 1 tablet (70 mg total) by mouth every 7 (seven) days. Take with a full glass of water on an empty stomach. 12 tablet 3   aluminum & magnesium hydroxide-simethicone (MYLANTA)  500-450-40 MG/5ML suspension Take by mouth every 6 (six) hours as needed for indigestion.     amoxicillin-clavulanate (AUGMENTIN) 875-125 MG tablet Take 1 tablet by mouth 2 (two) times daily for 7 days. 14 tablet 0   Calcium Citrate-Vitamin D (CITRACAL + D PO) Take 1 tablet by mouth daily.     diclofenac Sodium (  VOLTAREN) 1 % GEL Apply 1 Application topically as needed (pain).     dorzolamide-timolol (COSOPT) 2-0.5 % ophthalmic solution Place 1 drop into both eyes every morning.     fluticasone-salmeterol (WIXELA INHUB) 250-50 MCG/ACT AEPB INHALE 1 PUFF INTO THE LUNGS IN THE MORNING AND AT BEDTIME. 60 each 2   hydrochlorothiazide (MICROZIDE) 12.5 MG capsule TAKE 1 CAPSULE BY MOUTH EVERY DAY 90 capsule 0   Latanoprostene Bunod 0.024 % SOLN Apply 1 drop to eye at bedtime.     metoprolol succinate (TOPROL-XL) 25 MG 24 hr tablet Take 0.5 tablets (12.5 mg total) by mouth daily. 45 tablet 0   montelukast (SINGULAIR) 10 MG tablet Take 1 tablet (10 mg total) by mouth at bedtime. 90 tablet 0   niacin 500 MG tablet Take 500 mg by mouth daily.     Polyethyl Glycol-Propyl Glycol (SYSTANE OP) Place 1 drop into both eyes daily as needed (for dry eyes).     venlafaxine XR (EFFEXOR-XR) 37.5 MG 24 hr capsule Take 1 capsule (37.5 mg total) by mouth daily with breakfast. (Patient not taking: Reported on 08/05/2023) 90 capsule 0   No current facility-administered medications on file prior to visit.    Review of Systems:  As per HPI- otherwise negative.   Physical Examination: There were no vitals filed for this visit. There were no vitals filed for this visit. There is no height or weight on file to calculate BMI. Ideal Body Weight:    GEN: no acute distress. HEENT: Atraumatic, Normocephalic.  Ears and Nose: No external deformity. CV: RRR, No M/G/R. No JVD. No thrill. No extra heart sounds. PULM: CTA B, no wheezes, crackles, rhonchi. No retractions. No resp. distress. No accessory muscle use. ABD: S, NT,  ND, +BS. No rebound. No HSM. EXTR: No c/c/e PSYCH: Normally interactive. Conversant.    Assessment and Plan: ***  Signed Abbe Amsterdam, MD

## 2023-08-15 ENCOUNTER — Ambulatory Visit: Payer: Medicare Other | Admitting: Family Medicine

## 2023-08-16 ENCOUNTER — Other Ambulatory Visit: Payer: Self-pay | Admitting: Family Medicine

## 2023-08-23 ENCOUNTER — Other Ambulatory Visit: Payer: Self-pay | Admitting: Family

## 2023-08-23 DIAGNOSIS — R1032 Left lower quadrant pain: Secondary | ICD-10-CM

## 2023-08-24 NOTE — Progress Notes (Addendum)
 [facility] at [facility] [address], [address] [place], [place] [address] [phone]  Date:  08/25/2023   Name:  Alyssa Nielsen   DOB:  Aug 13, 1945   MRN:  [id number]  PCP:  Pearline Cables, MD    Chief Complaint: 10 day f/u (Seen by LM on 08/05/23. Started on Augmentin. Pt has noticed improvement. She has completed the Rx. /Concerns/ questions: 1. Review last CT. 2. Should she take calcium with her Fosamax?  3. Advise for Allergies 4. Another episode of SVT)   History of Present Illness:  Alyssa Nielsen is a 78 y.o. very pleasant female patient who presents with the following:  Patient seen today with concern of possible recent diverticulitis Most recent visit with myself was in July History of osteoporosis, vitamin D deficiency, hypertension, arthritis, breast cancer   When I last saw her in July one of her sisters had passed away, Alyssa Nielsen was still helping care for her remaining sister who is living in a nursing facility.  Her other sister also died earlier this month.  This has been very difficult  Her brother is in [place] She does have a few nieces and nephews but none very nearby She is close to her brother   She saw my partner Alyssa Nielsen on February 28 with abdominal pain-thought likely diverticulitis.  However nothing was seen on her CT scan obtained a few days later as below Alyssa Nielsen started Augmentin and got lab work, ordered a CT which was obtained on March 4: IMPRESSION: 1. No acute abnormality in the abdomen or pelvis. 2. Colonic stool burden compatible with constipation. 3. Nonobstructive 4 mm right renal stone. 4. Asymmetric prominence of left ovarian soft tissue greater than that expected for age but unchanged from March 05, 2017. Consider further evaluation with pelvic ultrasound. 5. Aortic atherosclerosis.  Alyssa Nielsen already ordered pelvic ultrasound for patient-scheduled for the later this week  She saw her  cardiologist in October to follow-up hypertension and episodic SVT  She tends to have a sensitive stomach and has been this way her whole life; suspect she likely has IBS. Alyssa Nielsen notes she tends to eat a lot of nuts in her daily diet.  She has always done this, she does not want to stop eating nuts unless she has to.  She wonders if she is ever actually been diagnosed with diverticulitis  She went to the ER about 5 weeks ago with SVT- they increased her dose of metoprolol which does seem to have helped  Sx have not recurred  Patient Active Problem List   Diagnosis Date Noted   SVT (supraventricular tachycardia) (HCC) 03/10/2023   Allergy    Anxiety    Cancer (HCC)    Chicken pox    Glaucoma    Hay fever    Hyperlipidemia    Personal history of urinary (tract) infections    Chronic throat clearing 08/05/2021   Acute pain of right knee 12/04/2020   Senile nuclear sclerosis 04/08/2017   History of artificial joint 01/01/2014   Osteoarthritis of left knee 12/26/2013   Asthma 03/14/2013   Osteoporosis 06/22/2011   Psoriasis 06/22/2011   Lower back pain 06/22/2011   Vitamin D deficiency 06/22/2011   DEPRESSION 06/30/2007   Essential hypertension 06/30/2007   ARTHRITIS 06/30/2007   ALLERGIC RHINITIS 01/19/2007   GERD 01/19/2007   BREAST CANCER, HX OF 01/19/2007   ESOPHAGEAL STRICTURE 08/24/2004   DIVERTICULOSIS OF COLON 07/28/2004  Past Medical History:  Diagnosis Date   Acute pain of right knee 12/04/2020   ALLERGIC RHINITIS 01/19/2007   Qualifier: Diagnosis of   By: Durwin Nora      Replacing diagnoses that were inactivated after the 09/06/22 regulatory import     Allergy    Anxiety    ARTHRITIS 06/30/2007   Qualifier: Diagnosis of   By: Misty Stanley CMA (AAMA), Marchelle Folks      Replacing diagnoses that were inactivated after the 09/06/22 regulatory import     Asthma    BREAST CANCER, HX OF 01/19/2007   Qualifier: Diagnosis of   By: Durwin Nora         Cancer [facility])    breast  cancer   Chicken pox    Chronic throat clearing 08/05/2021   Last Assessment & Plan:    Emergency department follow-up for acute exacerbation of a chronic problem with significant throat awareness.   Has been present for years.  She was seen over 5 years ago for similar symptoms.  On her most recent visit to the emergency department, she was having intense throat awareness.  Flexible laryngoscopy in the emergency department revealed no lesions, masses or    DIVERTICULOSIS OF COLON 07/28/2004   Qualifier: Diagnosis of   By: Misty Stanley CMA (AAMA), Amanda      Replacing diagnoses that were inactivated after the 09/06/22 regulatory import     ESOPHAGEAL STRICTURE 08/24/2004   Qualifier: Diagnosis of   By: Misty Stanley CMA (AAMA), Amanda         Essential hypertension 06/30/2007   Qualifier: Diagnosis of   By: Misty Stanley CMA (AAMA), Amanda         GERD 01/19/2007   Qualifier: Diagnosis of   By: Durwin Nora         Glaucoma    Hay fever    History of artificial joint 01/01/2014   Hyperlipidemia    Lower back pain 06/22/2011   Osteoarthritis of left knee 12/26/2013   Personal history of urinary (tract) infections    Psoriasis 06/22/2011   Senile nuclear sclerosis 04/08/2017   Vitamin D deficiency 06/22/2011    Past Surgical History:  Procedure Laterality Date   BREAST BIOPSY Left 01/26/2023   MM LT BREAST BX W LOC DEV 1ST LESION IMAGE BX SPEC STEREO GUIDE 01/26/2023 [facility] MAMMO[facility]AP[facility]   BREAST EXCISIONAL BIOPSY Left 1996   BREAST EXCISIONAL BIOPSY Right 1974   BREAST LUMPECTOMY Right 1999   malignant   BREAST SURGERY  1999  1996   R BREAST (CANCEROUS)  L BREAST (BENIGN)      MOUTH SURGERY     WISDOM TEETH REMOVAL   PARTIAL KNEE ARTHROPLASTY Left 12/26/2013   Procedure: UNICOMPARTMENTAL KNEE;  Surgeon: Dannielle Huh, MD;  Location: [facility] OR;  Service: Orthopedics;  Laterality: Left;   TONSILLECTOMY  1954    Social History   Tobacco Use   Smoking status: Former    Current packs/day: 0.00     Types: Cigarettes    Start date: 06/08/1971    Quit date: 06/07/1973    Years since quitting: 50.2   Smokeless tobacco: Never  Substance Use Topics   Alcohol use: No   Drug use: No    Family History  Problem Relation Age of Onset   Hypertension Mother    Heart disease Mother    Stroke Mother        HEMORRAGHIC   Cancer Father 33       PANCREATIC   Hypertension Father  Hypertension Sister    Thyroid disease Sister    Stroke Sister    Hypertension Brother    Colon cancer Cousin     Allergies  Allergen Reactions   Nsaids Other (See Comments)    Pt stated they make her stomach burn   Aspirin Other (See Comments)    GI UPSET-STOMACH BURNS   Ibuprofen Other (See Comments)    STOMACH BURNS SLIGHTLY   Effexor [Venlafaxine] Nausea Only    Medication list has been reviewed and updated.  Current Outpatient Medications on File Prior to Visit  Medication Sig Dispense Refill   alendronate (FOSAMAX) 70 MG tablet Take 1 tablet (70 mg total) by mouth every 7 (seven) days. Take with a full glass of water on an empty stomach. 12 tablet 3   aluminum & magnesium hydroxide-simethicone (MYLANTA) 500-450-40 MG/5ML suspension Take by mouth every 6 (six) hours as needed for indigestion.     Calcium Citrate-Vitamin D (CITRACAL + D PO) Take 1 tablet by mouth daily.     diclofenac Sodium (VOLTAREN) 1 % GEL Apply 1 Application topically as needed (pain).     dorzolamide-timolol (COSOPT) 2-0.5 % ophthalmic solution Place 1 drop into both eyes every morning.     fluticasone-salmeterol (WIXELA INHUB) 250-50 MCG/ACT AEPB INHALE 1 PUFF INTO THE LUNGS IN THE MORNING AND AT BEDTIME. 60 each 0   hydrochlorothiazide (MICROZIDE) 12.5 MG capsule TAKE 1 CAPSULE BY MOUTH EVERY DAY 90 capsule 0   Latanoprostene Bunod 0.024 % SOLN Apply 1 drop to eye at bedtime.     metoprolol succinate (TOPROL-XL) 25 MG 24 hr tablet Take 0.5 tablets (12.5 mg total) by mouth daily. (Patient taking differently: Take 25 mg by mouth  daily.) 45 tablet 0   montelukast (SINGULAIR) 10 MG tablet Take 1 tablet (10 mg total) by mouth at bedtime. 90 tablet 0   niacin 500 MG tablet Take 500 mg by mouth daily.     Polyethyl Glycol-Propyl Glycol (SYSTANE OP) Place 1 drop into both eyes daily as needed (for dry eyes).     No current facility-administered medications on file prior to visit.    Review of Systems:  As per HPI- otherwise negative.   Physical Examination: Vitals:   08/25/23 1306  BP: 118/80  Pulse: 71  Resp: 18  Temp: 98 F (36.7 C)  SpO2: 99%   Vitals:   08/25/23 1306  Height: 4\' 11"  (1.499 m)   Body mass index is 24.04 kg/m. Ideal Body Weight: Weight in (lb) to have BMI = 25: 123.5  GEN: no acute distress.  Normal weight, looks well HEENT: Atraumatic, Normocephalic.  Ears and Nose: No external deformity. CV: RRR, No M/G/R. No JVD. No thrill. No extra heart sounds. PULM: CTA B, no wheezes, crackles, rhonchi. No retractions. No resp. distress. No accessory muscle use. ABD: S, NT, ND, +BS. No rebound. No HSM. EXTR: No c/c/e PSYCH: Normally interactive. Conversant.    Assessment and Plan: Urinary frequency - Plan: Urine Culture  LLQ pain Patient seen today for follow-up.  As above, she was seen about 3 weeks ago with concern of possible diverticulitis.  However, CT scan obtained a few days later did not show any definite evidence of same.  Per her knowledge she has never actually been diagnosed with diverticulitis.  I advised Alyssa Nielsen she should be okay to continue her habit of eating nuts which is typical for her and does not usually cause any problems.  However she continues to have recurrent symptoms we  can reassess  She has plans to do pelvic ultrasound this weekend  Alyssa Nielsen does note more frequent voiding with lower volume.  She thinks this may be due to lack of drinking water.  Will obtain a urine culture to rule out UTI  Recommended dose of RSV  Signed Abbe Amsterdam, MD Addendum  3/24 Received urine culture, negative for infection. Called and let her know. She is feeling ok- she will let me know if any other concerns Results for orders placed or performed in visit on 08/25/23  Urine Culture   Collection Time: 08/25/23  2:13 PM   Specimen: Urine  Result Value Ref Range   MICRO NUMBER: [id number]    SPECIMEN QUALITY: Adequate    Sample Source URINE    STATUS: FINAL    Result: No Growth

## 2023-08-24 NOTE — Patient Instructions (Incomplete)
 It was good to see you again today Recommend dose of RSV vaccine if not done already- this can be done at your pharmacy  I will be in touch with your urine culture asap

## 2023-08-25 ENCOUNTER — Ambulatory Visit (INDEPENDENT_AMBULATORY_CARE_PROVIDER_SITE_OTHER): Admitting: Family Medicine

## 2023-08-25 VITALS — BP 118/80 | HR 71 | Temp 98.0°F | Resp 18 | Ht 59.0 in

## 2023-08-25 DIAGNOSIS — R1032 Left lower quadrant pain: Secondary | ICD-10-CM

## 2023-08-25 DIAGNOSIS — R350 Frequency of micturition: Secondary | ICD-10-CM

## 2023-08-26 LAB — URINE CULTURE
MICRO NUMBER:: 16226593
Result:: NO GROWTH
SPECIMEN QUALITY:: ADEQUATE

## 2023-08-27 ENCOUNTER — Ambulatory Visit (HOSPITAL_BASED_OUTPATIENT_CLINIC_OR_DEPARTMENT_OTHER)
Admission: RE | Admit: 2023-08-27 | Discharge: 2023-08-27 | Disposition: A | Discharge status: Home / Self Care | Source: Ambulatory Visit | Attending: Family | Admitting: Family

## 2023-08-27 DIAGNOSIS — R1032 Left lower quadrant pain: Secondary | ICD-10-CM | POA: Diagnosis present

## 2023-09-07 ENCOUNTER — Other Ambulatory Visit: Payer: Self-pay | Admitting: Family

## 2023-09-07 DIAGNOSIS — R9389 Abnormal findings on diagnostic imaging of other specified body structures: Secondary | ICD-10-CM

## 2023-09-11 ENCOUNTER — Other Ambulatory Visit: Payer: Self-pay | Admitting: Family Medicine

## 2023-09-16 ENCOUNTER — Other Ambulatory Visit: Payer: Self-pay | Admitting: Family

## 2023-09-16 ENCOUNTER — Telehealth: Payer: Self-pay

## 2023-09-16 DIAGNOSIS — R9389 Abnormal findings on diagnostic imaging of other specified body structures: Secondary | ICD-10-CM

## 2023-09-16 NOTE — Telephone Encounter (Signed)
 Copied from CRM [id number]. Topic: General - Other >> Sep 16, 2023  2:53 PM Aisha D wrote: Reason for CRM: Patient stated that she has a referral for a MRI sent by Ria Clock, NP, and would like to discuss that with her. Patient stated that she didn't know about the MRI and stated she had one recently and they referred her to a gynecologist. Patient would like to receive a call back about his concern.

## 2023-09-17 ENCOUNTER — Other Ambulatory Visit: Payer: Self-pay | Admitting: Family Medicine

## 2023-09-17 DIAGNOSIS — I10 Essential (primary) hypertension: Secondary | ICD-10-CM

## 2023-09-17 DIAGNOSIS — I872 Venous insufficiency (chronic) (peripheral): Secondary | ICD-10-CM

## 2023-09-21 NOTE — Telephone Encounter (Signed)
 Spoke with pt, pt is aware and expressed understanding.

## 2023-09-29 ENCOUNTER — Ambulatory Visit (INDEPENDENT_AMBULATORY_CARE_PROVIDER_SITE_OTHER): Admitting: Obstetrics and Gynecology

## 2023-09-29 ENCOUNTER — Encounter: Payer: Self-pay | Admitting: Obstetrics and Gynecology

## 2023-09-29 ENCOUNTER — Other Ambulatory Visit: Payer: Self-pay | Admitting: Family

## 2023-09-29 VITALS — BP 126/78 | HR 77 | Ht 58.25 in | Wt 118.0 lb

## 2023-09-29 DIAGNOSIS — N83201 Unspecified ovarian cyst, right side: Secondary | ICD-10-CM | POA: Diagnosis not present

## 2023-09-29 DIAGNOSIS — N83202 Unspecified ovarian cyst, left side: Secondary | ICD-10-CM

## 2023-09-29 NOTE — Progress Notes (Signed)
 78 y.o. postmenopausal female with hx of breast cancer here for consultation for bilateral ovarian cysts. Single.  No LMP recorded. Patient is postmenopausal.   08/09/23 CT scan was originally ordered due to LLQ abdominal pain. She describes it as a nagging pain. Today, pain is mild, 2/10. Present for a few months. +bloating. No N/V. No PMB, dysuria. Her sister had ovarian cancer in her 32s, lived for 68yr after.  08/09/23 CT Ab/pelvis: IMPRESSION: 1. No acute abnormality in the abdomen or pelvis. 2. Colonic stool burden compatible with constipation. 3. Nonobstructive 4 mm right renal stone. 4. Asymmetric prominence of left ovarian soft tissue greater than that expected for age but unchanged from March 05, 2017. Consider further evaluation with pelvic ultrasound. 5. Aortic atherosclerosis.  08/27/23 TVUS: IMPRESSION: 1. Bilateral complex ovarian cysts, measuring up to 3.5 cm on the left and 3.0 cm on the right. Recommend further evaluation with contrast-enhanced pelvic MRI. 2. Ovoid hypoechogenicity within the endometrial canal near the fundus measures 3 mm, which may represent a small endometrial polyp. Endometrial thickness of 5 mm is considered borderline abnormal in a postmenopausal woman. Tissue sampling is recommended. 3. 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  GYN HISTORY: No sig hx  OB History  No obstetric history on file.    Past Medical History:  Diagnosis Date   Acute pain of right knee 12/04/2020   ALLERGIC RHINITIS 01/19/2007   Qualifier: Diagnosis of   By: Sophronia Dutton      Replacing diagnoses that were inactivated after the 09/06/22 regulatory import     Allergy    Anxiety    ARTHRITIS 06/30/2007   Qualifier: Diagnosis of   By: Lewellyn CMA (AAMA), Mylinda Asa      Replacing diagnoses that were inactivated after the 09/06/22 regulatory import     Asthma    BREAST CANCER, HX OF 01/19/2007    Qualifier: Diagnosis of   By: Sophronia Dutton         Cancer [facility])    breast cancer   Chicken pox    Chronic throat clearing 08/05/2021   Last Assessment & Plan:    Emergency department follow-up for acute exacerbation of a chronic problem with significant throat awareness.   Has been present for years.  She was seen over 5 years ago for similar symptoms.  On her most recent visit to the emergency department, she was having intense throat awareness.  Flexible laryngoscopy in the emergency department revealed no lesions, masses or    DIVERTICULOSIS OF COLON 07/28/2004   Qualifier: Diagnosis of   By: Lewellyn CMA (AAMA), Amanda      Replacing diagnoses that were inactivated after the 09/06/22 regulatory import     ESOPHAGEAL STRICTURE 08/24/2004   Qualifier: Diagnosis of   By: Lewellyn CMA (AAMA), Amanda         Essential hypertension 06/30/2007   Qualifier: Diagnosis of   By: Lewellyn CMA (AAMA), Amanda         GERD 01/19/2007   Qualifier: Diagnosis of   By: Sophronia Dutton         Glaucoma    Hay fever    History of artificial joint 01/01/2014   Hyperlipidemia    Lower back pain 06/22/2011   Osteoarthritis of left knee 12/26/2013   Personal history of urinary (tract) infections    Psoriasis 06/22/2011   Senile nuclear sclerosis 04/08/2017   Vitamin D  deficiency 06/22/2011    Past Surgical History:  Procedure Laterality Date   BREAST BIOPSY Left 01/26/2023   MM LT BREAST BX W LOC DEV 1ST LESION IMAGE BX SPEC STEREO GUIDE 01/26/2023 [facility]AMMO[facility]   BREAST EXCISIONAL BIOPSY Left 1996   BREAST EXCISIONAL BIOPSY Right 1974   BREAST LUMPECTOMY Right 1999   malignant   BREAST SURGERY  1999  1996   R BREAST (CANCEROUS)  L BREAST (BENIGN)      MOUTH SURGERY     WISDOM TEETH REMOVAL   PARTIAL KNEE ARTHROPLASTY Left 12/26/2013   Procedure: UNICOMPARTMENTAL KNEE;  Surgeon: Christie Cox, MD;  Location: [facility] OR;  Service: Orthopedics;  Laterality: Left;   TONSILLECTOMY  1954    Current  Outpatient Medications on File Prior to Visit  Medication Sig Dispense Refill   alendronate  (FOSAMAX ) 70 MG tablet Take 1 tablet (70 mg total) by mouth every 7 (seven) days. Take with a full glass of water on an empty stomach. 12 tablet 3   aluminum & magnesium hydroxide-simethicone (MYLANTA) 500-450-40 MG/5ML suspension Take by mouth every 6 (six) hours as needed for indigestion.     diclofenac Sodium (VOLTAREN) 1 % GEL Apply 1 Application topically as needed (pain).     dorzolamide-timolol (COSOPT) 2-0.5 % ophthalmic solution Place 1 drop into both eyes every morning.     hydrochlorothiazide  (MICROZIDE ) 12.5 MG capsule TAKE 1 CAPSULE BY MOUTH EVERY DAY 90 capsule 0   Latanoprostene Bunod 0.024 % SOLN Apply 1 drop to eye at bedtime.     metoprolol  succinate (TOPROL -XL) 25 MG 24 hr tablet Take 0.5 tablets (12.5 mg total) by mouth daily. (Patient taking differently: Take 25 mg by mouth daily.) 45 tablet 0   montelukast  (SINGULAIR ) 10 MG tablet Take 1 tablet (10 mg total) by mouth at bedtime. 90 tablet 0   niacin 500 MG tablet Take 500 mg by mouth daily.     Polyethyl Glycol-Propyl Glycol (SYSTANE OP) Place 1 drop into both eyes daily as needed (for dry eyes).     WIXELA INHUB 250-50 MCG/ACT AEPB INHALE 1 PUFF INTO THE LUNGS IN THE MORNING AND AT BEDTIME. 60 each 0   Calcium Citrate-Vitamin D  (CITRACAL + D PO) Take 1 tablet by mouth daily. (Patient not taking: Reported on 09/29/2023)     No current facility-administered medications on file prior to visit.    Allergies  Allergen Reactions   Nsaids Other (See Comments)    Pt stated they make her stomach burn   Aspirin Other (See Comments)    GI UPSET-STOMACH BURNS   Ibuprofen Other (See Comments)    STOMACH BURNS SLIGHTLY   Effexor  [Venlafaxine ] Nausea Only      PE Today's Vitals   09/29/23 1417  BP: 126/78  Pulse: 77  SpO2: 98%  Weight: 118 lb (53.5 kg)  Height: 4' 10.25" (1.48 m)   Body mass index is 24.45 kg/m.  Physical  Exam Vitals reviewed. Exam conducted with a chaperone present.  Constitutional:      General: She is not in acute distress.    Appearance: Normal appearance.  HENT:     Head: Normocephalic and atraumatic.     Nose: Nose normal.  Eyes:     Extraocular Movements: Extraocular movements intact.     Conjunctiva/sclera: Conjunctivae normal.  Pulmonary:     Effort: Pulmonary effort is normal.  Abdominal:     Tenderness: There is abdominal tenderness (mild, generalized).  Genitourinary:    General: Normal vulva.     Exam position: Lithotomy position.  Vagina: Normal. No vaginal discharge.     Cervix: Normal. No cervical motion tenderness, discharge or lesion.     Uterus: Normal. Not enlarged and not tender.      Adnexa: Right adnexa normal and left adnexa normal.  Musculoskeletal:        General: Normal range of motion.     Cervical back: Normal range of motion.  Neurological:     General: No focal deficit present.     Mental Status: She is alert.  Psychiatric:        Mood and Affect: Mood normal.        Behavior: Behavior normal.    Assessment and Plan:        Bilateral ovarian cysts -     Ovarian Malignancy Risk-ROMA  Reviewed recent CT and TVUS, as noted above. Complex bilateral ovarian cysts, largest measuring 3.5cm on the left. Patient notes family history of ovarian cancer. Unclear etiology: Recommend tumor markers, MRI scheduled for 10/08/23 for further characterization. RTO in 3 wk for results discussion.   Romaine Closs, MD

## 2023-10-06 LAB — OVARIAN MALIGNANCY RISK-ROMA
CA125: 22 U/mL (ref <35)
HE4, OVARIAN CANCER MONITORING: 61 pmol/L
ROMA Postmenopausal: 1.72 (ref <2.77)
ROMA Premenopausal: 1.16 (ref <1.31)

## 2023-10-08 ENCOUNTER — Ambulatory Visit
Admission: RE | Admit: 2023-10-08 | Discharge: 2023-10-08 | Disposition: A | Discharge status: Home / Self Care | Source: Ambulatory Visit | Attending: Family | Admitting: Family

## 2023-10-08 DIAGNOSIS — R9389 Abnormal findings on diagnostic imaging of other specified body structures: Secondary | ICD-10-CM

## 2023-10-08 MED ORDER — GADOPICLENOL 0.5 MMOL/ML IV SOLN
5.0000 mL | Freq: Once | INTRAVENOUS | Status: AC | PRN
  Administered 2023-10-08: 5 mL via INTRAVENOUS

## 2023-10-14 ENCOUNTER — Other Ambulatory Visit: Payer: Self-pay | Admitting: Family Medicine

## 2023-10-16 ENCOUNTER — Other Ambulatory Visit: Payer: Self-pay | Admitting: Family Medicine

## 2023-10-16 DIAGNOSIS — J4520 Mild intermittent asthma, uncomplicated: Secondary | ICD-10-CM

## 2023-10-20 ENCOUNTER — Ambulatory Visit: Payer: Self-pay | Admitting: Obstetrics and Gynecology

## 2023-10-20 ENCOUNTER — Ambulatory Visit: Admitting: Obstetrics and Gynecology

## 2023-10-20 VITALS — BP 122/74 | HR 75 | Temp 97.7°F | Wt 118.4 lb

## 2023-10-20 DIAGNOSIS — N83202 Unspecified ovarian cyst, left side: Secondary | ICD-10-CM | POA: Diagnosis not present

## 2023-10-20 DIAGNOSIS — N83201 Unspecified ovarian cyst, right side: Secondary | ICD-10-CM

## 2023-10-20 NOTE — Progress Notes (Signed)
 78 y.o. postmenopausal female with hx of breast cancer here for follow-up for bilateral ovarian cysts. Single.  No LMP recorded. Patient is postmenopausal.   At 09/29/23 appt, she reported: pain is mild, nagging, 2/10. Present for a few months. +bloating. No N/V. No PMB, dysuria. Her sister had ovarian cancer in her 58s, lived for 72yr after.  Today, she reports no pain today. No PMB or dysuria. No N/V.  08/09/23 CT Ab/pelvis: IMPRESSION: 1. No acute abnormality in the abdomen or pelvis. 2. Colonic stool burden compatible with constipation. 3. Nonobstructive 4 mm right renal stone. 4. Asymmetric prominence of left ovarian soft tissue greater than that expected for age but unchanged from March 05, 2017. Consider further evaluation with pelvic ultrasound. 5. Aortic atherosclerosis.  08/27/23 TVUS: IMPRESSION: 1. Bilateral complex ovarian cysts, measuring up to 3.5 cm on the left and 3.0 cm on the right. Recommend further evaluation with contrast-enhanced pelvic MRI. 2. Ovoid hypoechogenicity within the endometrial canal near the fundus measures 3 mm, which may represent a small endometrial polyp. Endometrial thickness of 5 mm is considered borderline abnormal in a postmenopausal woman. Tissue sampling is recommended. 3. 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  10/08/2023 Pelvic MRI: IMPRESSION: 1. Multiple simple appearing and thinly septated fluid signal cystic lesions of the ovaries, in addition to an intrinsically T1 hyperintense hemorrhagic or proteinaceous cyst of the left ovary, nonenhancing, measuring 2.8 x 1.6 cm. No solid mass or suspicious contrast enhancement. In retrospective review, the ovaries are unchanged when compared to remote prior examination dated 03/05/2017, and these findings are benign, requiring no further follow-up or characterization. 2. Mildly expansile appearance of the cervix, with fluid or  cystic lesion in the central canal measuring 0.8 cm. There is no focal signal abnormality to suggest malignancy, however recommend correlation with physical examination and Pap smear. 3. Small uterine fibroids. 4. Sigmoid diverticulosis  GYN HISTORY: No sig hx  OB History  No obstetric history on file.    Past Medical History:  Diagnosis Date   Acute pain of right knee 12/04/2020   ALLERGIC RHINITIS 01/19/2007   Qualifier: Diagnosis of   By: Sophronia Dutton      Replacing diagnoses that were inactivated after the 09/06/22 regulatory import     Allergy    Anxiety    ARTHRITIS 06/30/2007   Qualifier: Diagnosis of   By: Lewellyn CMA (AAMA), Mylinda Asa      Replacing diagnoses that were inactivated after the 09/06/22 regulatory import     Asthma    BREAST CANCER, HX OF 01/19/2007   Qualifier: Diagnosis of   By: Sophronia Dutton         Cancer [facility])    breast cancer   Chicken pox    Chronic throat clearing 08/05/2021   Last Assessment & Plan:    Emergency department follow-up for acute exacerbation of a chronic problem with significant throat awareness.   Has been present for years.  She was seen over 5 years ago for similar symptoms.  On her most recent visit to the emergency department, she was having intense throat awareness.  Flexible laryngoscopy in the emergency department revealed no lesions, masses or    DIVERTICULOSIS OF COLON 07/28/2004   Qualifier: Diagnosis of   By: Lewellyn CMA (AAMA), Mylinda Asa      Replacing diagnoses that were inactivated after the 09/06/22 regulatory import     ESOPHAGEAL STRICTURE 08/24/2004   Qualifier: Diagnosis of   By: Lewellyn  CMA (AAMA), Amanda         Essential hypertension 06/30/2007   Qualifier: Diagnosis of   By: Lewellyn CMA (AAMA), Amanda         GERD 01/19/2007   Qualifier: Diagnosis of   By: Sophronia Dutton         Glaucoma    Hay fever    History of artificial joint 01/01/2014   Hyperlipidemia    Lower back pain 06/22/2011   Osteoarthritis of  left knee 12/26/2013   Personal history of urinary (tract) infections    Psoriasis 06/22/2011   Senile nuclear sclerosis 04/08/2017   Vitamin D  deficiency 06/22/2011    Past Surgical History:  Procedure Laterality Date   BREAST BIOPSY Left 01/26/2023   MM LT BREAST BX W LOC DEV 1ST LESION IMAGE BX SPEC STEREO GUIDE 01/26/2023 [facility] M[facility]MO[facility]   BREAST EXCISIONAL BIOPSY Left 1996   BREAST EXCISIONAL BIOPSY Right 1974   BREAST LUMPECTOMY Right 1999   malignant   BREAST SURGERY  1999  1996   R BREAST (CANCEROUS)  L BREAST (BENIGN)      MOUTH SURGERY     WISDOM TEETH REMOVAL   PARTIAL KNEE ARTHROPLASTY Left 12/26/2013   Procedure: UNICOMPARTMENTAL KNEE;  Surgeon: Christie Cox, MD;  Location: [facility] OR;  Service: Orthopedics;  Laterality: Left;   TONSILLECTOMY  1954    Current Outpatient Medications on File Prior to Visit  Medication Sig Dispense Refill   alendronate  (FOSAMAX ) 70 MG tablet Take 1 tablet (70 mg total) by mouth every 7 (seven) days. Take with a full glass of water on an empty stomach. 12 tablet 3   aluminum & magnesium hydroxide-simethicone (MYLANTA) 500-450-40 MG/5ML suspension Take by mouth every 6 (six) hours as needed for indigestion.     diclofenac Sodium (VOLTAREN) 1 % GEL Apply 1 Application topically as needed (pain).     dorzolamide-timolol (COSOPT) 2-0.5 % ophthalmic solution Place 1 drop into both eyes every morning.     fluticasone -salmeterol (WIXELA INHUB) 250-50 MCG/ACT AEPB Inhale 1 puff into the lungs 2 (two) times daily. in the morning and at bedtime. 60 each 3   hydrochlorothiazide  (MICROZIDE ) 12.5 MG capsule TAKE 1 CAPSULE BY MOUTH EVERY DAY 90 capsule 0   Latanoprostene Bunod 0.024 % SOLN Apply 1 drop to eye at bedtime.     metoprolol  succinate (TOPROL -XL) 25 MG 24 hr tablet Take 0.5 tablets (12.5 mg total) by mouth daily. (Patient taking differently: Take 25 mg by mouth daily.) 45 tablet 0   montelukast  (SINGULAIR ) 10 MG tablet TAKE 1 TABLET BY MOUTH  EVERYDAY AT BEDTIME 90 tablet 0   niacin 500 MG tablet Take 500 mg by mouth daily.     Polyethyl Glycol-Propyl Glycol (SYSTANE OP) Place 1 drop into both eyes daily as needed (for dry eyes).     Calcium Citrate-Vitamin D  (CITRACAL + D PO) Take 1 tablet by mouth daily. (Patient not taking: Reported on 10/20/2023)     No current facility-administered medications on file prior to visit.    Allergies  Allergen Reactions   Nsaids Other (See Comments)    Pt stated they make her stomach burn   Aspirin Other (See Comments)    GI UPSET-STOMACH BURNS   Ibuprofen Other (See Comments)    STOMACH BURNS SLIGHTLY   Effexor  [Venlafaxine ] Nausea Only      PE Today's Vitals   10/20/23 1028  BP: 122/74  Pulse: 75  Temp: 97.7 F (36.5 C)  TempSrc: Oral  SpO2:  96%  Weight: 118 lb 6.4 oz (53.7 kg)   Body mass index is 24.53 kg/m.  Physical Exam Vitals reviewed.  Constitutional:      General: She is not in acute distress.    Appearance: Normal appearance.  HENT:     Head: Normocephalic and atraumatic.     Nose: Nose normal.  Eyes:     Extraocular Movements: Extraocular movements intact.     Conjunctiva/sclera: Conjunctivae normal.  Pulmonary:     Effort: Pulmonary effort is normal.  Musculoskeletal:        General: Normal range of motion.     Cervical back: Normal range of motion.  Neurological:     General: No focal deficit present.     Mental Status: She is alert.  Psychiatric:        Mood and Affect: Mood normal.        Behavior: Behavior normal.    Assessment and Plan:        Bilateral ovarian cysts -     US  PELVIS TRANSVAGINAL NON-OB (TV ONLY); Future -     Ovarian Malignancy Risk-ROMA; Future  Reviewed tumor markers & MRI: Suggestive of benign process. Cervical enlargement noted. Normal prior pelvic exam April 2024. Patient remains asymptomatic today. No indication for EMB with ET<59mm. Recommend surveillance with ultrasound and tumor markers in 6 months. To call with  any concerning symptoms  Romaine Closs, MD

## 2023-10-25 ENCOUNTER — Other Ambulatory Visit

## 2023-10-25 DIAGNOSIS — N83201 Unspecified ovarian cyst, right side: Secondary | ICD-10-CM

## 2023-10-27 ENCOUNTER — Encounter: Payer: Self-pay | Admitting: Obstetrics and Gynecology

## 2023-10-27 ENCOUNTER — Ambulatory Visit: Admitting: Obstetrics and Gynecology

## 2023-11-01 ENCOUNTER — Ambulatory Visit: Payer: Self-pay | Admitting: Obstetrics and Gynecology

## 2023-11-01 LAB — OVARIAN MALIGNANCY RISK-ROMA
CA125: 27 U/mL (ref <35)
HE4, OVARIAN CANCER MONITORING: 75 pmol/L
ROMA Postmenopausal: 2.33 (ref <2.77)
ROMA Premenopausal: 1.81 — ABNORMAL HIGH (ref <1.31)

## 2023-11-08 ENCOUNTER — Other Ambulatory Visit: Payer: Self-pay | Admitting: Family Medicine

## 2023-11-08 DIAGNOSIS — I471 Supraventricular tachycardia, unspecified: Secondary | ICD-10-CM

## 2023-11-15 NOTE — Progress Notes (Deleted)
 [facility] [address], [address] [place], [place] [address] [phone]  Date:  11/17/2023   Name:  Alyssa Nielsen   DOB:  [birth year]   MRN:  [id number]  PCP:  Kaylee Partridge, MD    Chief Complaint: No chief complaint on file.   History of Present Illness:  Alyssa Nielsen is a 78 y.o. very pleasant female patient who presents with the following:  Patient seen today for follow-up from recent ER visit I saw her most recently in March of this year History of osteoporosis, vitamin D  deficiency, hypertension, arthritis, breast cancer   She was in the emergency room at [facility] about a week ago with concern of palpitations Alyssa Nielsen is a 78 y.o. female who presents to the ED with complaints of palpitations. Patient states she initially noted palpitations around 2200 this evening. Attempted to apply cool compress without improvement she then called EMS at that time rhythm consistent with SVT in the 200s. Patient received 6 mg of adenosine and converted to normal sinus rhythm. Patient endorses history of similar episodes in the past. Patient is on metoprolol  states she was previously on 25 mg however refill was sent for 12.5 mg expresses concern for dosage change. She currently denies any symptoms including chest pain, shortness of breath, palpitations lightheadedness or dizziness.   CXR normal She was discharged to home with instructions to take metoprolol  25 daily and follow-up with myself and cardiology  Patient Active Problem List   Diagnosis Date Noted   SVT (supraventricular tachycardia) (HCC) 03/10/2023   Allergy    Anxiety    Cancer (HCC)    Chicken pox    Glaucoma    Hay fever    Hyperlipidemia    Personal history of urinary (tract) infections    Chronic throat clearing 08/05/2021   Acute pain of right knee 12/04/2020   Senile nuclear sclerosis 04/08/2017   History of artificial joint 01/01/2014    Osteoarthritis of left knee 12/26/2013   Asthma 03/14/2013   Osteoporosis 06/22/2011   Psoriasis 06/22/2011   Lower back pain 06/22/2011   Vitamin D  deficiency 06/22/2011   DEPRESSION 06/30/2007   Essential hypertension 06/30/2007   ARTHRITIS 06/30/2007   ALLERGIC RHINITIS 01/19/2007   GERD 01/19/2007   BREAST CANCER, HX OF 01/19/2007   ESOPHAGEAL STRICTURE 08/24/2004   DIVERTICULOSIS OF COLON 07/28/2004    Past Medical History:  Diagnosis Date   Acute pain of right knee 12/04/2020   ALLERGIC RHINITIS 01/19/2007   Qualifier: Diagnosis of   By: Sophronia Dutton      Replacing diagnoses that were inactivated after the 09/06/22 regulatory import     Allergy    Anxiety    ARTHRITIS 06/30/2007   Qualifier: Diagnosis of   By: Lewellyn CMA (AAMA), Amanda      Replacing diagnoses that were inactivated after the 09/06/22 regulatory import     Asthma    BREAST CANCER, HX OF 01/19/2007   Qualifier: Diagnosis of   By: Sophronia Dutton         Cancer [facility])    breast cancer   Chicken pox    Chronic throat clearing 08/05/2021   Last Assessment & Plan:    Emergency department follow-up for acute exacerbation of a chronic problem with significant throat awareness.   Has been present for years.  She was seen over 5 years ago for similar symptoms.  On her most  recent visit to the emergency department, she was having intense throat awareness.  Flexible laryngoscopy in the emergency department revealed no lesions, masses or    DIVERTICULOSIS OF COLON 07/28/2004   Qualifier: Diagnosis of   By: Lewellyn CMA (AAMA), Amanda      Replacing diagnoses that were inactivated after the 09/06/22 regulatory import     ESOPHAGEAL STRICTURE 08/24/2004   Qualifier: Diagnosis of   By: Lewellyn CMA (AAMA), Amanda         Essential hypertension 06/30/2007   Qualifier: Diagnosis of   By: Lewellyn CMA (AAMA), Amanda         GERD 01/19/2007   Qualifier: Diagnosis of   By: Sophronia Dutton         Glaucoma    Hay fever     History of artificial joint 01/01/2014   Hyperlipidemia    Lower back pain 06/22/2011   Osteoarthritis of left knee 12/26/2013   Personal history of urinary (tract) infections    Psoriasis 06/22/2011   Senile nuclear sclerosis 04/08/2017   Vitamin D  deficiency 06/22/2011    Past Surgical History:  Procedure Laterality Date   BREAST BIOPSY Left 01/26/2023   MM LT BREAST BX W LOC DEV 1ST LESION IMAGE BX SPEC STEREO GUIDE 01/26/2023 [facility]AMMO[facility]AP[facility]   BREAST EXCISIONAL BIOPSY Left 1996   BREAST EXCISIONAL BIOPSY Right 1974   BREAST LUMPECTOMY Right 1999   malignant   BREAST SURGERY  1999  1996   R BREAST (CANCEROUS)  L BREAST (BENIGN)      MOUTH SURGERY     WISDOM TEETH REMOVAL   PARTIAL KNEE ARTHROPLASTY Left 12/26/2013   Procedure: UNICOMPARTMENTAL KNEE;  Surgeon: Christie Cox, MD;  Location: [facility] OR;  Service: Orthopedics;  Laterality: Left;   TONSILLECTOMY  1954    Social History   Tobacco Use   Smoking status: Former    Current packs/day: 0.00    Types: Cigarettes    Start date: 06/08/1971    Quit date: 06/07/1973    Years since quitting: 50.4   Smokeless tobacco: Never  Substance Use Topics   Alcohol use: No   Drug use: No    Family History  Problem Relation Age of Onset   Hypertension Mother    Heart disease Mother    Stroke Mother        HEMORRAGHIC   Cancer Father 51       PANCREATIC   Hypertension Father    Breast cancer Sister    Hypertension Sister    Thyroid  disease Sister    Stroke Sister    Hypertension Brother    Colon cancer Cousin     Allergies  Allergen Reactions   Nsaids Other (See Comments)    Pt stated they make her stomach burn   Aspirin Other (See Comments)    GI UPSET-STOMACH BURNS   Ibuprofen Other (See Comments)    STOMACH BURNS SLIGHTLY   Effexor  [Venlafaxine ] Nausea Only    Medication list has been reviewed and updated.  Current Outpatient Medications on File Prior to Visit  Medication Sig Dispense Refill   alendronate   (FOSAMAX ) 70 MG tablet Take 1 tablet (70 mg total) by mouth every 7 (seven) days. Take with a full glass of water on an empty stomach. 12 tablet 3   aluminum & magnesium hydroxide-simethicone (MYLANTA) 500-450-40 MG/5ML suspension Take by mouth every 6 (six) hours as needed for indigestion.     diclofenac Sodium (VOLTAREN) 1 % GEL Apply 1 Application topically as  needed (pain).     dorzolamide-timolol (COSOPT) 2-0.5 % ophthalmic solution Place 1 drop into both eyes every morning.     fluticasone -salmeterol (WIXELA INHUB) 250-50 MCG/ACT AEPB Inhale 1 puff into the lungs 2 (two) times daily. in the morning and at bedtime. 60 each 3   hydrochlorothiazide  (MICROZIDE ) 12.5 MG capsule TAKE 1 CAPSULE BY MOUTH EVERY DAY 90 capsule 0   Latanoprostene Bunod 0.024 % SOLN Apply 1 drop to eye at bedtime.     metoprolol  succinate (TOPROL -XL) 25 MG 24 hr tablet Take 0.5 tablets (12.5 mg total) by mouth daily. 45 tablet 0   montelukast  (SINGULAIR ) 10 MG tablet TAKE 1 TABLET BY MOUTH EVERYDAY AT BEDTIME 90 tablet 0   niacin 500 MG tablet Take 500 mg by mouth daily.     Polyethyl Glycol-Propyl Glycol (SYSTANE OP) Place 1 drop into both eyes daily as needed (for dry eyes).     No current facility-administered medications on file prior to visit.    Review of Systems:  ***  Physical Examination: There were no vitals filed for this visit. There were no vitals filed for this visit. There is no height or weight on file to calculate BMI. Ideal Body Weight:    ***  Assessment and Plan: ***  Signed Gates Kasal, MD

## 2023-11-17 ENCOUNTER — Ambulatory Visit: Admitting: Family Medicine

## 2023-11-17 ENCOUNTER — Inpatient Hospital Stay: Admitting: Family Medicine

## 2023-11-17 VITALS — BP 139/69 | HR 63 | Temp 98.0°F | Resp 16 | Ht <= 58 in | Wt 119.0 lb

## 2023-11-17 DIAGNOSIS — E876 Hypokalemia: Secondary | ICD-10-CM

## 2023-11-17 DIAGNOSIS — I471 Supraventricular tachycardia, unspecified: Secondary | ICD-10-CM | POA: Diagnosis not present

## 2023-11-17 DIAGNOSIS — S41112A Laceration without foreign body of left upper arm, initial encounter: Secondary | ICD-10-CM

## 2023-11-17 DIAGNOSIS — R5383 Other fatigue: Secondary | ICD-10-CM

## 2023-11-17 DIAGNOSIS — K219 Gastro-esophageal reflux disease without esophagitis: Secondary | ICD-10-CM

## 2023-11-17 MED ORDER — METOPROLOL SUCCINATE ER 25 MG PO TB24: 25.0000 mg | ORAL_TABLET | Freq: Every day | ORAL | 3 refills | Status: DC

## 2023-11-17 NOTE — Progress Notes (Addendum)
 [facility] [address] [place], [place] [address] [phone]  Date:  11/17/2023   Name:  Alyssa Nielsen   DOB:  02-28-1946   MRN:  [id number]  PCP:  Kaylee Partridge, MD    Chief Complaint: No chief complaint on file.   History of Present Illness:  Alyssa Nielsen is a 78 y.o. very pleasant female patient who presents with the following:  Patient seen today for follow-up from recent ER visit I saw her most recently in March of this year History of osteoporosis, vitamin D  deficiency, hypertension, arthritis, breast cancer  She does drink some coffee in the am but this has not changed No other major caffeine intake- she does eat some chocolate   She was in the emergency room at [facility] about a week ago with concern of palpitations Alyssa Nielsen is a 78 y.o. female who presents to the ED with complaints of palpitations. Patient states she initially noted palpitations around 2200 this evening. Attempted to apply cool compress without improvement she then called EMS at that time rhythm consistent with SVT in the 200s. Patient received 6 mg of adenosine and converted to normal sinus rhythm. Patient endorses history of similar episodes in the past. Patient is on metoprolol  states she was previously on 25 mg however refill was sent for 12.5 mg expresses concern for dosage change. She currently denies any symptoms including chest pain, shortness of breath, palpitations lightheadedness or dizziness.   CXR normal She was discharged to home with instructions to take metoprolol  25 daily and follow-up with myself and cardiology  Pt notes she is feeling tired still,but the palpitations are gone She sees Dr Madireddy with cardiology- does not have an appt set up to see him yet  This is her 3rd episode of SVT in the last 12 months  She got a skin tear in her left arm when EMS started her IV- I will check on this for  her  She notes since she had a sinus infection in December she may have episodes of vertigo- she will have a sensation of movement when she is still, as well as nausea May last most of the day.  This is not currently active however  She notes heartburn- a burning feeling in her throat- which has been present for maybe 6 months Most often occurs after she eats her dinner and occurs most days   She denies CP or SOB, chest pressure when she has the GERD sx She may feel SOB when she exercises-unchanged She took omeprazole for 2 weeks and it did help- she did this about a month ago She is now using pepcid prn and this helps to   Patient Active Problem List   Diagnosis Date Noted   SVT (supraventricular tachycardia) (HCC) 03/10/2023   Allergy    Anxiety    Cancer (HCC)    Chicken pox    Glaucoma    Hay fever    Hyperlipidemia    Personal history of urinary (tract) infections    Chronic throat clearing 08/05/2021   Acute pain of right knee 12/04/2020   Senile nuclear sclerosis 04/08/2017   History of artificial joint 01/01/2014   Osteoarthritis of left knee 12/26/2013   Asthma 03/14/2013   Osteoporosis 06/22/2011   Psoriasis 06/22/2011   Lower back pain 06/22/2011   Vitamin D  deficiency 06/22/2011   DEPRESSION 06/30/2007   Essential hypertension 06/30/2007  ARTHRITIS 06/30/2007   ALLERGIC RHINITIS 01/19/2007   GERD 01/19/2007   BREAST CANCER, HX OF 01/19/2007   ESOPHAGEAL STRICTURE 08/24/2004   DIVERTICULOSIS OF COLON 07/28/2004    Past Medical History:  Diagnosis Date   Acute pain of right knee 12/04/2020   ALLERGIC RHINITIS 01/19/2007   Qualifier: Diagnosis of   By: Sophronia Dutton      Replacing diagnoses that were inactivated after the 09/06/22 regulatory import     Allergy    Anxiety    ARTHRITIS 06/30/2007   Qualifier: Diagnosis of   By: Lewellyn CMA (AAMA), Mylinda Asa      Replacing diagnoses that were inactivated after the 09/06/22 regulatory import     Asthma    BREAST  CANCER, HX OF 01/19/2007   Qualifier: Diagnosis of   By: Sophronia Dutton         Cancer [facility])    breast cancer   Chicken pox    Chronic throat clearing 08/05/2021   Last Assessment & Plan:    Emergency department follow-up for acute exacerbation of a chronic problem with significant throat awareness.   Has been present for years.  She was seen over 5 years ago for similar symptoms.  On her most recent visit to the emergency department, she was having intense throat awareness.  Flexible laryngoscopy in the emergency department revealed no lesions, masses or    DIVERTICULOSIS OF COLON 07/28/2004   Qualifier: Diagnosis of   By: Lewellyn CMA (AAMA), Amanda      Replacing diagnoses that were inactivated after the 09/06/22 regulatory import     ESOPHAGEAL STRICTURE 08/24/2004   Qualifier: Diagnosis of   By: Lewellyn CMA (AAMA), Amanda         Essential hypertension 06/30/2007   Qualifier: Diagnosis of   By: Lewellyn CMA (AAMA), Amanda         GERD 01/19/2007   Qualifier: Diagnosis of   By: Sophronia Dutton         Glaucoma    Hay fever    History of artificial joint 01/01/2014   Hyperlipidemia    Lower back pain 06/22/2011   Osteoarthritis of left knee 12/26/2013   Personal history of urinary (tract) infections    Psoriasis 06/22/2011   Senile nuclear sclerosis 04/08/2017   Vitamin D  deficiency 06/22/2011    Past Surgical History:  Procedure Laterality Date   BREAST BIOPSY Left 01/26/2023   MM LT BREAST BX W LOC DEV 1ST LESION IMAGE BX SPEC STEREO GUIDE 01/26/2023 [facility]   BREAST EXCISIONAL BIOPSY Left 1996   BREAST EXCISIONAL BIOPSY Right 1974   BREAST LUMPECTOMY Right 1999   malignant   BREAST SURGERY  1999  1996   R BREAST (CANCEROUS)  L BREAST (BENIGN)      MOUTH SURGERY     WISDOM TEETH REMOVAL   PARTIAL KNEE ARTHROPLASTY Left 12/26/2013   Procedure: UNICOMPARTMENTAL KNEE;  Surgeon: Christie Cox, MD;  Location: [facility] OR;  Service: Orthopedics;  Laterality: Left;    TONSILLECTOMY  1954    Social History   Tobacco Use   Smoking status: Former    Current packs/day: 0.00    Types: Cigarettes    Start date: 06/08/1971    Quit date: 06/07/1973    Years since quitting: 50.4   Smokeless tobacco: Never  Substance Use Topics   Alcohol use: No   Drug use: No    Family History  Problem Relation Age of Onset   Hypertension Mother    Heart  disease Mother    Stroke Mother        HEMORRAGHIC   Cancer Father 54       PANCREATIC   Hypertension Father    Breast cancer Sister    Hypertension Sister    Thyroid  disease Sister    Stroke Sister    Hypertension Brother    Colon cancer Cousin     Allergies  Allergen Reactions   Nsaids Other (See Comments)    Pt stated they make her stomach burn   Aspirin Other (See Comments)    GI UPSET-STOMACH BURNS   Ibuprofen Other (See Comments)    STOMACH BURNS SLIGHTLY   Effexor  [Venlafaxine ] Nausea Only    Medication list has been reviewed and updated.  Current Outpatient Medications on File Prior to Visit  Medication Sig Dispense Refill   alendronate  (FOSAMAX ) 70 MG tablet Take 1 tablet (70 mg total) by mouth every 7 (seven) days. Take with a full glass of water on an empty stomach. 12 tablet 3   aluminum & magnesium hydroxide-simethicone (MYLANTA) 500-450-40 MG/5ML suspension Take by mouth every 6 (six) hours as needed for indigestion.     diclofenac Sodium (VOLTAREN) 1 % GEL Apply 1 Application topically as needed (pain).     dorzolamide-timolol (COSOPT) 2-0.5 % ophthalmic solution Place 1 drop into both eyes every morning.     fluticasone -salmeterol (WIXELA INHUB) 250-50 MCG/ACT AEPB Inhale 1 puff into the lungs 2 (two) times daily. in the morning and at bedtime. 60 each 3   hydrochlorothiazide  (MICROZIDE ) 12.5 MG capsule TAKE 1 CAPSULE BY MOUTH EVERY DAY 90 capsule 0   Latanoprostene Bunod 0.024 % SOLN Apply 1 drop to eye at bedtime.     montelukast  (SINGULAIR ) 10 MG tablet TAKE 1 TABLET BY MOUTH EVERYDAY  AT BEDTIME 90 tablet 0   niacin 500 MG tablet Take 500 mg by mouth daily.     Polyethyl Glycol-Propyl Glycol (SYSTANE OP) Place 1 drop into both eyes daily as needed (for dry eyes).     No current facility-administered medications on file prior to visit.    Review of Systems:  As per HPI- otherwise negative.   Physical Examination: Vitals:   11/17/23 1042  BP: 139/69  Pulse: 63  Resp: 16  Temp: 98 F (36.7 C)  SpO2: 99%   Vitals:   11/17/23 1042  Weight: 119 lb (54 kg)  Height: 4' 10 (1.473 m)   Body mass index is 24.87 kg/m. Ideal Body Weight: Weight in (lb) to have BMI = 25: 119.4  GEN: no acute distress.  Normal weight, looks well HEENT: Atraumatic, Normocephalic.  Bilateral TM wnl, oropharynx normal.  PEERL,EOMI.   Ears and Nose: No external deformity. CV: RRR, No M/G/R. No JVD. No thrill. No extra heart sounds. PULM: CTA B, no wheezes, crackles, rhonchi. No retractions. No resp. distress. No accessory muscle use. ABD: S, NT, ND, +BS. No rebound. No HSM. EXTR: No c/c/e PSYCH: Normally interactive. Conversant.  Skin tear on left forearm, the skin is well aligned.  She has it dressed properly, no sign of infection   Assessment and Plan: SVT (supraventricular tachycardia) (HCC) - Plan: metoprolol  succinate (TOPROL -XL) 25 MG 24 hr tablet  Hypokalemia - Plan: Basic metabolic panel with GFR  Chronic GERD  Other fatigue - Plan: Vitamin B12, TSH  Skin tear of left upper arm without complication, initial encounter  Patient seen today for follow-up from recent ER visit with SVT.  She is now back in normal sinus  rhythm, she is taking a slightly higher dose of metoprolol .  I will send a message to her cardiologist to get her scheduled for follow-up  She is concerned about what seems like chronic GERD, she notes a family was recently diagnosed with esophageal cancer.  She has not had an upper GI in the past.  I will reach out to her gastroenterologist as well regarding  a possible appointment.  Can continue to use either H2 blocker or PPI in the interim per her preference Follow-up on routine blood work as above  Discussed how to care for her skin tear, so far it appears to be healing well Signed Gates Kasal, MD  Addendum 6/13, received labs as below.  Letter to patient  Results for orders placed or performed in visit on 11/17/23  Basic metabolic panel with GFR   Collection Time: 11/17/23 11:09 AM  Result Value Ref Range   Glucose, Bld 88 65 - 99 mg/dL   BUN 14 7 - 25 mg/dL   Creat 1.61 0.96 - 0.45 mg/dL   eGFR 88 > OR = 60 WU/JWJ/1.91Y7   BUN/Creatinine Ratio SEE NOTE: 6 - 22 (calc)   Sodium 141 135 - 146 mmol/L   Potassium 4.0 3.5 - 5.3 mmol/L   Chloride 105 98 - 110 mmol/L   CO2 25 20 - 32 mmol/L   Calcium 9.7 8.6 - 10.4 mg/dL  Vitamin B12   Collection Time: 11/17/23 11:09 AM  Result Value Ref Range   Vitamin B-12 261 200 - 1,100 pg/mL  TSH   Collection Time: 11/17/23 11:09 AM  Result Value Ref Range   TSH 1.44 0.40 - 4.50 mIU/L

## 2023-11-17 NOTE — Patient Instructions (Addendum)
 I will reach out to cardiology and also your gastroenterologist to get you seen for follow-up Please let me know if anything changes or is getting worse in the meantime Ok to use your pepcid or omeprazole daily in the meantime -whichever was most helpful for you   I will be in touch with your labs asap Please do get your RSV at the pharmacy!

## 2023-11-18 LAB — BASIC METABOLIC PANEL WITH GFR
BUN: 14 mg/dL (ref 7–25)
CO2: 25 mmol/L (ref 20–32)
Calcium: 9.7 mg/dL (ref 8.6–10.4)
Chloride: 105 mmol/L (ref 98–110)
Creat: 0.69 mg/dL (ref 0.60–1.00)
Glucose, Bld: 88 mg/dL (ref 65–99)
Potassium: 4 mmol/L (ref 3.5–5.3)
Sodium: 141 mmol/L (ref 135–146)
eGFR: 88 mL/min/{1.73_m2} (ref >=60)

## 2023-11-18 LAB — VITAMIN B12: Vitamin B-12: 261 pg/mL (ref 200–1100)

## 2023-11-18 LAB — TSH: TSH: 1.44 m[IU]/L (ref 0.40–4.50)

## 2023-11-23 ENCOUNTER — Telehealth: Payer: Self-pay

## 2023-11-23 DIAGNOSIS — I471 Supraventricular tachycardia, unspecified: Secondary | ICD-10-CM

## 2023-11-23 NOTE — Telephone Encounter (Signed)
 Recommendations reviewed with pt as per Dr. Madireddy's note.  Pt verbalized understanding and had no additional questions.

## 2023-11-23 NOTE — Telephone Encounter (Signed)
-----   Message from Daymon Evans Madireddy sent at 11/17/2023  5:38 PM EDT ----- Paroxysmal SVT episodes.  Had recent ER visit again for an episode that was aborted with adenosine given to her by EMS. Please schedule her for visit with electrophysiologist Dr. Lawana Pray at our office. Thank you ----- Message ----- From: Kaylee Partridge, MD Sent: 11/17/2023   4:28 PM EDT To: Daymon Evans Madireddy, MD  Hi Sreedhar-I wanted to see if you could get Alyssa Nielsen in for an office visit.  She has been in the ER couple more times recently with SVT.  They adjusted her dose of metoprolol  and she is doing fine today but would like to see you.  Thanks so much! Jess

## 2023-11-29 ENCOUNTER — Ambulatory Visit: Attending: Cardiology | Admitting: Cardiology

## 2023-11-29 ENCOUNTER — Encounter: Payer: Self-pay | Admitting: Cardiology

## 2023-11-29 VITALS — BP 118/82 | HR 70 | Ht <= 58 in | Wt 116.0 lb

## 2023-11-29 DIAGNOSIS — I10 Essential (primary) hypertension: Secondary | ICD-10-CM | POA: Insufficient documentation

## 2023-11-29 DIAGNOSIS — I471 Supraventricular tachycardia, unspecified: Secondary | ICD-10-CM | POA: Insufficient documentation

## 2023-11-29 MED ORDER — METOPROLOL SUCCINATE ER 50 MG PO TB24: 50.0000 mg | ORAL_TABLET | Freq: Every day | ORAL | 2 refills | Status: AC

## 2023-11-29 NOTE — Progress Notes (Signed)
  Electrophysiology Office Note:   Date:  11/29/2023  ID:  Alyssa Nielsen, DOB 06-07-1946, MRN [id number]  Primary Cardiologist: None Primary Heart Failure: None Electrophysiologist: Kambra Beachem Gladis Norton, MD      History of Present Illness:   Alyssa Nielsen is a 78 y.o. female with h/o SVT, hyperlipidemia, hypertension seen today for  for Electrophysiology evaluation of SVT at the request of Sreedhar Madireddy.    She presented to the emergency room at [facility] with palpitations.  She called EMS and found to have a heart rate in the 200s.  She received adenosine via EMS and converted to sinus rhythm.  She has had similar episodes in the past.  She is on metoprolol  currently.  She has had 3 episodes of SVT in the last year.  2 episodes terminated spontaneously.  Her most recent episode was the longest period she has no chest pain with the episodes, only shortness of breath and palpitations as well as some dizziness.  She has not had syncope or near syncope.  Review of systems complete and found to be negative unless listed in HPI.   EP Information / Studies Reviewed:    EKG is ordered today. Personal review as below.  EKG Interpretation Date/Time:  Tuesday November 29 2023 10:51:31 EDT Ventricular Rate:  71 PR Interval:  120 QRS Duration:  114 QT Interval:  410 QTC Calculation: 445 R Axis:   78  Text Interpretation: Normal sinus rhythm Right bundle branch block When compared with ECG of 10-Mar-2023 10:46, Right bundle branch block is now Present Confirmed by Joley Utecht ([id number]) on 11/29/2023 11:06:21 AM   Risk Assessment/Calculations:          Physical Exam:   VS:  BP 118/82 (BP Location: Right Arm, Patient Position: Sitting, Cuff Size: Normal)   Pulse 70   Ht 4' 10 (1.473 m)   Wt 116 lb (52.6 kg)   SpO2 98%   BMI 24.24 kg/m    Wt Readings from Last 3 Encounters:  11/29/23 116 lb (52.6 kg)  11/17/23 119 lb (54 kg)  10/20/23 118 lb 6.4 oz (53.7 kg)     GEN: Well nourished, well  developed in no acute distress NECK: No JVD; No carotid bruits CARDIAC: Regular rate and rhythm, no murmurs, rubs, gallops RESPIRATORY:  Clear to auscultation without rales, wheezing or rhonchi  ABDOMEN: Soft, non-tender, non-distended EXTREMITIES:  No edema; No deformity   ASSESSMENT AND PLAN:    1.  SVT: Has a very irregular wide-complex tachycardia consistent with SVT.  She does have a right bundle branch block at baseline.  She is on low-dose metoprolol .  I we discussed increasing her metoprolol  versus ablation.  At this time, she would like to avoid procedures if possible.  Thurmon Mizell increase Toprol -XL to 50 mg daily.  Farmer Mccahill also provide diltiazem 30 mg as needed.  2.  Hypertension: Well-controlled  Follow up with Dr. Norton in 6 months  Signed, Jendayi Berling Gladis Norton, MD

## 2023-11-29 NOTE — Patient Instructions (Addendum)
 Medication Instructions:  Your physician has recommended you make the following change in your medication:  INCREASE Metoprolol  Succinate (Toprol ) to 50 mg daily at bedtime TAKE Diltiazem 30 mg every 6 hours AS NEEDED for elevated heart rates  *If you need a refill on your cardiac medications before your next appointment, please call your pharmacy*  Lab Work: None ordered   Testing/Procedures: None ordered  Follow-Up: At [facility], you and your health needs are our priority.  As part of our continuing mission to provide you with exceptional heart care, our providers are all part of one team.  This team includes your primary Cardiologist (physician) and Advanced Practice Providers or APPs (Physician Assistants and Nurse Practitioners) who all work together to provide you with the care you need, when you need it.  Your next appointment:   6 month(s) in our [employer] office  Provider:   Soyla Norton, MD    We recommend signing up for the patient portal called MyChart.  Sign up information is provided on this After Visit Summary.  MyChart is used to connect with patients for Virtual Visits (Telemedicine).  Patients are able to view lab/test results, encounter notes, upcoming appointments, etc.  Non-urgent messages can be sent to your provider as well.   To learn more about what you can do with MyChart, go to [email or web address].   Thank you for choosing [facility]!!   Maeola Domino, RN [phone]

## 2023-11-30 ENCOUNTER — Telehealth: Payer: Self-pay | Admitting: Cardiology

## 2023-11-30 NOTE — Telephone Encounter (Signed)
 Patient identification verified by 2 forms. Alyssa Ellen, RN     Called and spoke to patient  Patient states:  - Pt said Doctor told her to take at bed time but the script said with meal. She also wants to know if she has some of the 25 mg can she take two of those to finish them up.  Also per his ov note he was going to send in  Will also provide diltiazem 30 mg as needed.  Patient denies:  - abnormal symptoms at time of call             Interventions/Plan: - Chart review shows patient to take metoprolol  succinate (TOPROL -XL) 50 MG 24 hr tablet.  Patient may double up on her 25mg  tablets to finish that bottle.  - Patient notes no changes with rate control as this time so will continue with metoprolol  and update us  regarding changes requiring prn diltiazem 30mg  per ov note.   Patient agrees with plan, no questions at this time

## 2023-11-30 NOTE — Telephone Encounter (Signed)
 Pt c/o medication issue:  1. Name of Medication:   metoprolol  succinate (TOPROL -XL) 50 MG 24 hr tablet    2. How are you currently taking this medication (dosage and times per day)? She has questions about this  3. Are you having a reaction (difficulty breathing--STAT)? no  4. What is your medication issue? Pt said Doctor told her to take at bed time but the script said with meal. She also wants to know if she has some of the 25 mg can she take two of those to finish them up.  Also per his ov note he was going to send in  Will also provide diltiazem 30 mg as needed.

## 2023-12-01 MED ORDER — DILTIAZEM HCL 30 MG PO TABS: 30.0000 mg | ORAL_TABLET | Freq: Four times a day (QID) | ORAL | 1 refills | Status: AC | PRN

## 2023-12-01 NOTE — Telephone Encounter (Signed)
 Followed up with pt, apologized for not sending in PRN Diltiazem.  Made aware correcting that now and Rx sent to pharmacy.    Advised to take Metoprolol  at bedtime. Advised to call office if she is having  any issues or unwanted SE after medication change. Patient verbalized understanding and agreeable to plan.

## 2023-12-05 ENCOUNTER — Other Ambulatory Visit: Payer: Self-pay | Admitting: Family Medicine

## 2023-12-05 DIAGNOSIS — Z1231 Encounter for screening mammogram for malignant neoplasm of breast: Secondary | ICD-10-CM

## 2023-12-07 ENCOUNTER — Other Ambulatory Visit: Payer: Self-pay | Admitting: Family Medicine

## 2023-12-07 DIAGNOSIS — M810 Age-related osteoporosis without current pathological fracture: Secondary | ICD-10-CM

## 2023-12-16 ENCOUNTER — Other Ambulatory Visit: Payer: Self-pay | Admitting: Family Medicine

## 2023-12-16 DIAGNOSIS — I872 Venous insufficiency (chronic) (peripheral): Secondary | ICD-10-CM

## 2023-12-16 DIAGNOSIS — I10 Essential (primary) hypertension: Secondary | ICD-10-CM

## 2024-01-11 ENCOUNTER — Other Ambulatory Visit: Payer: Self-pay | Admitting: Family Medicine

## 2024-01-11 ENCOUNTER — Ambulatory Visit
Admission: RE | Admit: 2024-01-11 | Discharge: 2024-01-11 | Disposition: A | Discharge status: Home / Self Care | Source: Ambulatory Visit | Attending: Family Medicine | Admitting: Family Medicine

## 2024-01-11 DIAGNOSIS — J4520 Mild intermittent asthma, uncomplicated: Secondary | ICD-10-CM

## 2024-01-11 DIAGNOSIS — Z1231 Encounter for screening mammogram for malignant neoplasm of breast: Secondary | ICD-10-CM

## 2024-02-08 ENCOUNTER — Other Ambulatory Visit: Payer: Self-pay | Admitting: Family Medicine

## 2024-02-14 ENCOUNTER — Ambulatory Visit (INDEPENDENT_AMBULATORY_CARE_PROVIDER_SITE_OTHER): Admitting: Obstetrics and Gynecology

## 2024-02-14 ENCOUNTER — Encounter: Payer: Self-pay | Admitting: Obstetrics and Gynecology

## 2024-02-14 ENCOUNTER — Ambulatory Visit (INDEPENDENT_AMBULATORY_CARE_PROVIDER_SITE_OTHER)

## 2024-02-14 VITALS — BP 126/62 | HR 60 | Temp 98.2°F | Wt 123.0 lb

## 2024-02-14 DIAGNOSIS — N83202 Unspecified ovarian cyst, left side: Secondary | ICD-10-CM | POA: Diagnosis not present

## 2024-02-14 DIAGNOSIS — N83201 Unspecified ovarian cyst, right side: Secondary | ICD-10-CM | POA: Diagnosis not present

## 2024-02-14 NOTE — Progress Notes (Signed)
 78 y.o. postmenopausal female with hx of breast cancer here for 6 month follow-up for bilateral ovarian cysts. Single.  She was originally referred from her PCP for incidental findings of bilateral ovarian cyst on CT scan for abdominal pain.  She underwent subsequent ultrasound and MRI which noted benign ovarian cysts, overall unchanged from 2018 CT scan.  At 09/29/23 appt, she reported: pain is mild, nagging, 2/10. Present for a few months. +bloating. No N/V. No PMB, dysuria. Her sister had ovarian cancer in her 86s, lived for 85yr after.  Today, she reports no pain today. No PMB or dysuria. No N/V.  08/09/23 CT Ab/pelvis for abdominal pain revealed: IMPRESSION: 1. No acute abnormality in the abdomen or pelvis. 2. Colonic stool burden compatible with constipation. 3. Nonobstructive 4 mm right renal stone. 4. Asymmetric prominence of left ovarian soft tissue greater than that expected for age but unchanged from March 05, 2017. Consider further evaluation with pelvic ultrasound. 5. Aortic atherosclerosis.  08/27/23 TVUS: IMPRESSION: 1. Bilateral complex ovarian cysts, measuring up to 3.5 cm on the left and 3.0 cm on the right. Recommend further evaluation with contrast-enhanced pelvic MRI. 2. Ovoid hypoechogenicity within the endometrial canal near the fundus measures 3 mm, which may represent a small endometrial polyp. Endometrial thickness of 5 mm is considered borderline abnormal in a postmenopausal woman. Tissue sampling is recommended. 3. 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  10/08/2023 Pelvic MRI: IMPRESSION: 1. Multiple simple appearing and thinly septated fluid signal cystic lesions of the ovaries, in addition to an intrinsically T1 hyperintense hemorrhagic or proteinaceous cyst of the left ovary, nonenhancing, measuring 2.8 x 1.6 cm. No solid mass or suspicious contrast enhancement. In retrospective review, the  ovaries are unchanged when compared to remote prior examination dated 03/05/2017, and these findings are benign, requiring no further follow-up or characterization. 2. Mildly expansile appearance of the cervix, with fluid or cystic lesion in the central canal measuring 0.8 cm. There is no focal signal abnormality to suggest malignancy, however recommend correlation with physical examination and Pap smear. 3. Small uterine fibroids. 4. Sigmoid diverticulosis  GYN HISTORY: No sig hx  OB History  Gravida Para Term Preterm AB Living  0 0 0 0 0 0  SAB IAB Ectopic Multiple Live Births  0 0 0 0 0    Past Medical History:  Diagnosis Date   Acute pain of right knee 12/04/2020   ALLERGIC RHINITIS 01/19/2007   Qualifier: Diagnosis of   By: Zulema Georgi      Replacing diagnoses that were inactivated after the 09/06/22 regulatory import     Allergy    Anxiety    ARTHRITIS 06/30/2007   Qualifier: Diagnosis of   By: Lewellyn CMA (AAMA), Amanda      Replacing diagnoses that were inactivated after the 09/06/22 regulatory import     Asthma    BREAST CANCER, HX OF 01/19/2007   Qualifier: Diagnosis of   By: Zulema Georgi         Cancer [facility])    breast cancer   Chicken pox    Chronic throat clearing 08/05/2021   Last Assessment & Plan:    Emergency department follow-up for acute exacerbation of a chronic problem with significant throat awareness.   Has been present for years.  She was seen over 5 years ago for similar symptoms.  On her most recent visit to the emergency department, she was having intense throat awareness.  Flexible laryngoscopy in  the emergency department revealed no lesions, masses or    DIVERTICULOSIS OF COLON 07/28/2004   Qualifier: Diagnosis of   By: Lewellyn CMA (AAMA), Alan      Replacing diagnoses that were inactivated after the 09/06/22 regulatory import     ESOPHAGEAL STRICTURE 08/24/2004   Qualifier: Diagnosis of   By: Lewellyn CMA (AAMA), Amanda         Essential  hypertension 06/30/2007   Qualifier: Diagnosis of   By: Lewellyn CMA (AAMA), Amanda         GERD 01/19/2007   Qualifier: Diagnosis of   By: Zulema Georgi         Glaucoma    Hay fever    History of artificial joint 01/01/2014   Hyperlipidemia    Lower back pain 06/22/2011   Osteoarthritis of left knee 12/26/2013   Personal history of urinary (tract) infections    Psoriasis 06/22/2011   Senile nuclear sclerosis 04/08/2017   Vitamin D  deficiency 06/22/2011    Past Surgical History:  Procedure Laterality Date   BREAST BIOPSY Left 01/26/2023   MM LT BREAST BX W LOC DEV 1ST LESION IMAGE BX SPEC STEREO GUIDE 01/26/2023 [facility]   BREAST EXCISIONAL BIOPSY Left 1996   BREAST EXCISIONAL BIOPSY Right 1974   BREAST LUMPECTOMY Right 1999   malignant   BREAST SURGERY  1999  1996   R BREAST (CANCEROUS)  L BREAST (BENIGN)      MOUTH SURGERY     WISDOM TEETH REMOVAL   PARTIAL KNEE ARTHROPLASTY Left 12/26/2013   Procedure: UNICOMPARTMENTAL KNEE;  Surgeon: Marcey Raman, MD;  Location: [facility] OR;  Service: Orthopedics;  Laterality: Left;   TONSILLECTOMY  1954    Current Outpatient Medications on File Prior to Visit  Medication Sig Dispense Refill   alendronate  (FOSAMAX ) 70 MG tablet TAKE 1 TABLET (70 MG TOTAL) BY MOUTH EVERY 7 DAYS WITH FULL GLASS WATER ON EMPTY STOMACH 12 tablet 3   aluminum & magnesium hydroxide-simethicone (MYLANTA) 500-450-40 MG/5ML suspension Take by mouth every 6 (six) hours as needed for indigestion.     diclofenac Sodium (VOLTAREN) 1 % GEL Apply 1 Application topically as needed (pain).     diltiazem  (CARDIZEM ) 30 MG tablet Take 1 tablet (30 mg total) by mouth every 6 (six) hours as needed (for elevated heart rates). 30 tablet 1   dorzolamide-timolol (COSOPT) 2-0.5 % ophthalmic solution Place 1 drop into both eyes every morning.     famotidine-calcium carbonate-magnesium hydroxide (PEPCID COMPLETE) 10-800-165 MG chewable tablet Chew 1 tablet by mouth daily as needed.      fluticasone -salmeterol (WIXELA INHUB) 250-50 MCG/ACT AEPB Inhale 1 puff into the lungs in the morning and at bedtime. 60 each 3   hydrochlorothiazide  (MICROZIDE ) 12.5 MG capsule Take 1 capsule (12.5 mg total) by mouth daily. 90 capsule 1   Latanoprostene Bunod 0.024 % SOLN Apply 1 drop to eye at bedtime.     metoprolol  succinate (TOPROL -XL) 50 MG 24 hr tablet Take 1 tablet (50 mg total) by mouth daily. Take with or immediately following a meal. 90 tablet 2   montelukast  (SINGULAIR ) 10 MG tablet TAKE 1 TABLET BY MOUTH EVERYDAY AT BEDTIME 90 tablet 1   niacin 500 MG tablet Take 500 mg by mouth daily.     Polyethyl Glycol-Propyl Glycol (SYSTANE OP) Place 1 drop into both eyes daily as needed (for dry eyes).     No current facility-administered medications on file prior to visit.    Allergies  Allergen Reactions  Nsaids Other (See Comments)    Pt stated they make her stomach burn   Aspirin Other (See Comments)    GI UPSET-STOMACH BURNS   Ibuprofen Other (See Comments)    STOMACH BURNS SLIGHTLY   Effexor  [Venlafaxine ] Nausea Only      PE Today's Vitals   02/14/24 1054  BP: 126/62  Pulse: 60  Temp: 98.2 F (36.8 C)  TempSrc: Oral  SpO2: 99%  Weight: 123 lb (55.8 kg)   Body mass index is 25.71 kg/m.  Physical Exam Vitals reviewed.  Constitutional:      General: She is not in acute distress.    Appearance: Normal appearance.  HENT:     Head: Normocephalic and atraumatic.     Nose: Nose normal.  Eyes:     Extraocular Movements: Extraocular movements intact.     Conjunctiva/sclera: Conjunctivae normal.  Pulmonary:     Effort: Pulmonary effort is normal.  Abdominal:     General: There is distension (mild).     Palpations: Abdomen is soft.     Tenderness: There is no abdominal tenderness.  Musculoskeletal:        General: Normal range of motion.     Cervical back: Normal range of motion.  Neurological:     General: No focal deficit present.     Mental Status: She  is alert.  Psychiatric:        Mood and Affect: Mood normal.        Behavior: Behavior normal.    02/14/24 TVUS: Indications: Bilateral ovarian cyst Comparison: 08/27/23  Findings:   Uterus: 5.0 x 2.9 x 2.3 cm, anteverted.  3 intramural fibroids noted, the largest measuring 1.5 cm. Endometrial thickness: 2 mm. Left ovary: 3.8 x 3.2 x 2.0 cm.  There is a 2.7 x 1.6 cm complex avascular cyst and a 1.7 x 1.5 cm simple avascular multiloculated cyst. Right ovary: 2.9 x 2.3 x 2.2 cm.  There is a 2.8 x 2.1 cm simple avascular multiloculated cyst. No free fluid.  Impression:  Stable bilateral ovarian cysts and uterine fibroids when compared to 08/27/2023 TVUS.  Vera LULLA Pa, MD    Assessment and Plan:        Bilateral ovarian cysts -     Ovarian Malignancy Risk-ROMA -     US  PELVIS TRANSVAGINAL NON-OB (TV ONLY); Future  Previously reviewed tumor markers & MRI: Suggestive of benign process. Cervical enlargement noted.  Normal prior pelvic exam April 2024. Chronic bilateral cysts present on 03/05/17 CT ab/pelvis TVUS today with stable bilateral ovarian cysts and uterine fibroids. Recommend surveillance with ultrasound and tumor markers in 6 months to complete 1 yr of surveillance. To call with any concerning symptoms Pt in agreement  Vera LULLA Pa, MD

## 2024-02-17 LAB — OVARIAN MALIGNANCY RISK-ROMA
CA125: 27 U/mL (ref <35)
HE4, OVARIAN CANCER MONITORING: 80 pmol/L
ROMA Postmenopausal: 2.46 (ref <2.77)
ROMA Premenopausal: 2.04 — ABNORMAL HIGH (ref <1.31)

## 2024-02-20 ENCOUNTER — Ambulatory Visit: Payer: Self-pay | Admitting: Obstetrics and Gynecology

## 2024-04-17 ENCOUNTER — Ambulatory Visit

## 2024-04-17 VITALS — BP 120/60 | HR 66 | Temp 97.7°F | Ht <= 58 in | Wt 124.2 lb

## 2024-04-17 DIAGNOSIS — Z0000 Encounter for general adult medical examination without abnormal findings: Secondary | ICD-10-CM

## 2024-04-17 NOTE — Progress Notes (Cosign Needed Addendum)
 Subjective:   Alyssa Nielsen is a 78 y.o. female who presents for a Welcome to Medicare Exam.   Allergies (verified) Nsaids, Aspirin, Ibuprofen, and Effexor  [venlafaxine ]   History: Past Medical History:  Diagnosis Date   Acute pain of right knee 12/04/2020   ALLERGIC RHINITIS 01/19/2007   Qualifier: Diagnosis of   By: Zulema Georgi      Replacing diagnoses that were inactivated after the 09/06/22 regulatory import     Allergy    Anxiety    ARTHRITIS 06/30/2007   Qualifier: Diagnosis of   By: Lewellyn CMA (AAMA), Amanda      Replacing diagnoses that were inactivated after the 09/06/22 regulatory import     Asthma    BREAST CANCER, HX OF 01/19/2007   Qualifier: Diagnosis of   By: Zulema Georgi         Cancer [facility])    breast cancer   Chicken pox    Chronic throat clearing 08/05/2021   Last Assessment & Plan:    Emergency department follow-up for acute exacerbation of a chronic problem with significant throat awareness.   Has been present for years.  She was seen over 5 years ago for similar symptoms.  On her most recent visit to the emergency department, she was having intense throat awareness.  Flexible laryngoscopy in the emergency department revealed no lesions, masses or    DIVERTICULOSIS OF COLON 07/28/2004   Qualifier: Diagnosis of   By: Lewellyn CMA (AAMA), Amanda      Replacing diagnoses that were inactivated after the 09/06/22 regulatory import     ESOPHAGEAL STRICTURE 08/24/2004   Qualifier: Diagnosis of   By: Lewellyn CMA (AAMA), Amanda         Essential hypertension 06/30/2007   Qualifier: Diagnosis of   By: Lewellyn CMA (AAMA), Amanda         GERD 01/19/2007   Qualifier: Diagnosis of   By: Zulema Georgi         Glaucoma    Hay fever    History of artificial joint 01/01/2014   Hyperlipidemia    Lower back pain 06/22/2011   Osteoarthritis of left knee 12/26/2013   Personal history of urinary (tract) infections    Psoriasis 06/22/2011   Senile nuclear sclerosis  04/08/2017   Vitamin D  deficiency 06/22/2011   Past Surgical History:  Procedure Laterality Date   BREAST BIOPSY Left 01/26/2023   MM LT BREAST BX W LOC DEV 1ST LESION IMAGE BX SPEC STEREO GUIDE 01/26/2023 [facility]   BREAST EXCISIONAL BIOPSY Left 1996   BREAST EXCISIONAL BIOPSY Right 1974   BREAST LUMPECTOMY Right 1999   malignant   BREAST SURGERY  1999  1996   R BREAST (CANCEROUS)  L BREAST (BENIGN)      MOUTH SURGERY     WISDOM TEETH REMOVAL   PARTIAL KNEE ARTHROPLASTY Left 12/26/2013   Procedure: UNICOMPARTMENTAL KNEE;  Surgeon: Marcey Raman, MD;  Location: [facility] OR;  Service: Orthopedics;  Laterality: Left;   TONSILLECTOMY  1954   Family History  Problem Relation Age of Onset   Hypertension Mother    Heart disease Mother    Stroke Mother        HEMORRAGHIC   Cancer Father 42       PANCREATIC   Hypertension Father    Breast cancer Sister    Hypertension Sister    Thyroid  disease Sister    Stroke Sister    Hypertension Brother    Colon cancer Cousin  Social History   Occupational History   Not on file  Tobacco Use   Smoking status: Former    Current packs/day: 0.00    Types: Cigarettes    Start date: 06/08/1971    Quit date: 06/07/1973    Years since quitting: 50.9   Smokeless tobacco: Never  Substance and Sexual Activity   Alcohol use: No   Drug use: No   Sexual activity: Never   Tobacco Counseling Counseling given: Not Answered  SDOH Screenings   Food Insecurity: No Food Insecurity (04/17/2024)  Housing: Unknown (04/17/2024)  Transportation Needs: No Transportation Needs (04/17/2024)  Utilities: Not At Risk (04/17/2024)  Depression (PHQ2-9): Low Risk  (04/17/2024)  Physical Activity: Insufficiently Active (04/17/2024)  Social Connections: Moderately Isolated (04/17/2024)  Stress: No Stress Concern Present (04/17/2024)  Tobacco Use: Medium Risk (04/17/2024)  Health Literacy: Adequate Health Literacy (04/17/2024)   Depression Screen     04/17/2024   10:17 AM 01/03/2023    1:20 PM 03/16/2022   10:07 AM 08/17/2021   10:53 AM 05/12/2016    9:57 AM 04/16/2016   10:52 AM 04/08/2016   11:29 AM  PHQ 2/9 Scores  PHQ - 2 Score 0 0 2 0 0 0 0  PHQ- 9 Score   5          Data saved with a previous flowsheet row definition     Goals Addressed               This Visit's Progress     Increase physical activity (pt-stated)         Visit info / Clinical Intake: Medicare Wellness Visit Type:: Subsequent Annual Wellness Visit Persons participating in visit:: patient Information given by:: patient Interpreter Needed?: No Pre-visit prep was completed: no AWV questionnaire completed by patient prior to visit?: no Living arrangements:: (!) lives alone Patient's Overall Health Status Rating: good Typical amount of pain: none Does pain affect daily life?: no Are you currently prescribed opioids?: no  Dietary Habits and Nutritional Risks How many meals a day?: 3 Eats fruit and vegetables daily?: yes Most meals are obtained by: preparing own meals In the last 2 weeks, have you had any of the following?: none Diabetic:: no  Functional Status Activities of Daily Living (to include ambulation/medication): Independent Ambulation: Independent with device- listed below Home Assistive Devices/Equipment: Eyeglasses Medication Administration: Independent Home Management: Independent Manage your own finances?: yes Primary transportation is: driving Concerns about vision?: no *vision screening is required for WTM* Concerns about hearing?: (!) yes (Some hearing loss. Followed by medical attention) Uses hearing aids?: no Hear whispered voice?: yes  Fall Screening Falls in the past year?: 1 Number of falls in past year: 0 Was there an injury with Fall?: 0 Fall Risk Category Calculator: 1 Patient Fall Risk Level: Low Fall Risk  Fall Risk Patient at Risk for Falls Due to: Impaired balance/gait; No Fall Risks Fall risk Follow up:  Falls prevention discussed  Home and Transportation Safety: All rugs have non-skid backing?: yes All stairs or steps have railings?: N/A, no stairs Grab bars in the bathtub or shower?: (!) no Have non-skid surface in bathtub or shower?: (!) no (Pending matt to place in shower) Good home lighting?: yes Regular seat belt use?: yes Hospital stays in the last year:: no  Cognitive Assessment Difficulty concentrating, remembering, or making decisions? : no Will 6CIT or Mini Cog be Completed: no 6CIT or Mini Cog Declined: patient alert, oriented, able to answer questions appropriately and recall recent  events  Advance Directives (For Healthcare) Does Patient Have a Medical Advance Directive?: No Would patient like information on creating a medical advance directive?: -- (Information given this visit)  Reviewed/Updated  Reviewed/Updated: Reviewed All (Medical, Surgical, Family, Medications, Allergies, Care Teams, Patient Goals)         Objective:    Today's Vitals   04/17/24 0956  BP: 120/60  Pulse: 66  Temp: 97.7 F (36.5 C)  TempSrc: Oral  SpO2: 97%  Weight: 124 lb 3.2 oz (56.3 kg)  Height: 4' 10 (1.473 m)   Body mass index is 25.96 kg/m.   Physical Exam   Current Medications (verified) Outpatient Encounter Medications as of 04/17/2024  Medication Sig   alendronate  (FOSAMAX ) 70 MG tablet TAKE 1 TABLET (70 MG TOTAL) BY MOUTH EVERY 7 DAYS WITH FULL GLASS WATER ON EMPTY STOMACH   aluminum & magnesium hydroxide-simethicone (MYLANTA) 500-450-40 MG/5ML suspension Take by mouth every 6 (six) hours as needed for indigestion.   diclofenac Sodium (VOLTAREN) 1 % GEL Apply 1 Application topically as needed (pain).   diltiazem  (CARDIZEM ) 30 MG tablet Take 1 tablet (30 mg total) by mouth every 6 (six) hours as needed (for elevated heart rates).   dorzolamide-timolol (COSOPT) 2-0.5 % ophthalmic solution Place 1 drop into both eyes every morning.   famotidine-calcium  carbonate-magnesium hydroxide (PEPCID COMPLETE) 10-800-165 MG chewable tablet Chew 1 tablet by mouth daily as needed.   fluticasone -salmeterol (WIXELA INHUB) 250-50 MCG/ACT AEPB Inhale 1 puff into the lungs in the morning and at bedtime.   hydrochlorothiazide  (MICROZIDE ) 12.5 MG capsule Take 1 capsule (12.5 mg total) by mouth daily.   Latanoprostene Bunod 0.024 % SOLN Apply 1 drop to eye at bedtime.   metoprolol  succinate (TOPROL -XL) 50 MG 24 hr tablet Take 1 tablet (50 mg total) by mouth daily. Take with or immediately following a meal.   montelukast  (SINGULAIR ) 10 MG tablet TAKE 1 TABLET BY MOUTH EVERYDAY AT BEDTIME   niacin 500 MG tablet Take 500 mg by mouth daily.   Polyethyl Glycol-Propyl Glycol (SYSTANE OP) Place 1 drop into both eyes daily as needed (for dry eyes).   No facility-administered encounter medications on file as of 04/17/2024.   Hearing/Vision screen No results found. Immunizations and Health Maintenance Health Maintenance  Topic Date Due   COVID-19 Vaccine (6 - Moderna risk 2025-26 season) 09/11/2024   Medicare Annual Wellness (AWV)  04/17/2025   DTaP/Tdap/Td (3 - Td or Tdap) 03/27/2028   Pneumococcal Vaccine: 50+ Years  Completed   Influenza Vaccine  Completed   Hepatitis C Screening  Completed   Zoster Vaccines- Shingrix  Completed   Meningococcal B Vaccine  Aged Out   Mammogram  Discontinued         Assessment/Plan:  This is a routine wellness examination for Lovilia.  Patient Care Team: Copland, Harlene BROCKS, MD as PCP - General (Family Medicine) Inocencio Soyla Lunger, MD as PCP - Electrophysiology (Cardiology)  I have personally reviewed and noted the following in the patient's chart:   Medical and social history Use of alcohol, tobacco or illicit drugs  Current medications and supplements including opioid prescriptions. Functional ability and status Nutritional status Physical activity Advanced directives List of other physicians Hospitalizations,  surgeries, and ER visits in previous 12 months Vitals Screenings to include cognitive, depression, and falls Referrals and appointments  No orders of the defined types were placed in this encounter.  In addition, I have reviewed and discussed with patient certain preventive protocols, quality metrics, and best  practice recommendations. A written personalized care plan for preventive services as well as general preventive health recommendations were provided to patient.   Rojelio LELON Blush, LPN   [id number]    Return in 1 year on 04/24/25

## 2024-04-17 NOTE — Patient Instructions (Addendum)
 Alyssa Nielsen,  Thank you for taking the time for your Medicare Wellness Visit. I appreciate your continued commitment to your health goals. Please review the care plan we discussed, and feel free to reach out if I can assist you further.  Please note that Annual Wellness Visits do not include a physical exam. Some assessments may be limited, especially if the visit was conducted virtually. If needed, we may recommend an in-person follow-up with your provider.  Ongoing Care Seeing your primary care provider every 3 to 6 months helps us  monitor your health and provide consistent, personalized care.   Referrals If a referral was made during today's visit and you haven't received any updates within two weeks, please contact the referred provider directly to check on the status.  Recommended Screenings:  Health Maintenance  Topic Date Due   COVID-19 Vaccine (6 - Moderna risk 2025-26 season) 09/11/2024   Medicare Annual Wellness Visit  04/17/2025   DTaP/Tdap/Td vaccine (3 - Td or Tdap) 03/27/2028   Pneumococcal Vaccine for age over 77  Completed   Flu Shot  Completed   Hepatitis C Screening  Completed   Zoster (Shingles) Vaccine  Completed   Meningitis B Vaccine  Aged Out   Breast Cancer Screening  Discontinued       04/17/2024   10:03 AM  Advanced Directives  Does Patient Have a Medical Advance Directive? No  Would patient like information on creating a medical advance directive? No - Patient declined    Vision: Annual vision screenings are recommended for early detection of glaucoma, cataracts, and diabetic retinopathy. These exams can also reveal signs of chronic conditions such as diabetes and high blood pressure.  Dental: Annual dental screenings help detect early signs of oral cancer, gum disease, and other conditions linked to overall health, including heart disease and diabetes.  Please see the attached documents for additional preventive care recommendations.

## 2024-04-25 NOTE — Progress Notes (Signed)
 This visit was performed in person.

## 2024-05-22 NOTE — Patient Instructions (Incomplete)
 It was great to see you again today I put in a referral for you to see Dr Josefa Herring for your shoulder  [facility]  [address] [place] [address] Office: [phone]

## 2024-05-22 NOTE — Progress Notes (Unsigned)
 [facility] at [facility] [address], [address] [place], [place] [address] [phone]  Date:  05/23/2024   Name:  Alyssa Nielsen   DOB:  1945-07-23   MRN:  [id number]  PCP:  Watt Harlene BROCKS, MD    Chief Complaint: No chief complaint on file.   History of Present Illness:  Alyssa Nielsen is a 78 y.o. very pleasant female patient who presents with the following:  Patient seen today for follow-up of recent urgent care visit for UTI Most recent visit with myself was in June History of osteoporosis, vitamin D  deficiency, hypertension, arthritis, breast cancer, SVT  She follows up with cardiology for her SVT, seen by Dr. Inocencio in June She was seen by gynecology in September for concern about bilateral ovarian cysts seen on her recent CT I am not able to view her recent urgent care visit in epic  Discussed the use of AI scribe software for clinical note transcription with the patient, who gave verbal consent to proceed.  History of Present Illness    Patient Active Problem List   Diagnosis Date Noted   SVT (supraventricular tachycardia) 03/10/2023   Allergy    Anxiety    Cancer (HCC)    Chicken pox    Glaucoma    Hay fever    Hyperlipidemia    Personal history of urinary (tract) infections    Chronic throat clearing 08/05/2021   Acute pain of right knee 12/04/2020   Senile nuclear sclerosis 04/08/2017   History of artificial joint 01/01/2014   Osteoarthritis of left knee 12/26/2013   Asthma 03/14/2013   Osteoporosis 06/22/2011   Psoriasis 06/22/2011   Lower back pain 06/22/2011   Vitamin D  deficiency 06/22/2011   DEPRESSION 06/30/2007   Essential hypertension 06/30/2007   ARTHRITIS 06/30/2007   ALLERGIC RHINITIS 01/19/2007   GERD 01/19/2007   BREAST CANCER, HX OF 01/19/2007   ESOPHAGEAL STRICTURE 08/24/2004   DIVERTICULOSIS OF COLON 07/28/2004    Past Medical History:  Diagnosis Date   Acute pain of right knee  12/04/2020   ALLERGIC RHINITIS 01/19/2007   Qualifier: Diagnosis of   By: Zulema Georgi      Replacing diagnoses that were inactivated after the 09/06/22 regulatory import     Allergy    Anxiety    ARTHRITIS 06/30/2007   Qualifier: Diagnosis of   By: Lewellyn CMA (AAMA), Amanda      Replacing diagnoses that were inactivated after the 09/06/22 regulatory import     Asthma    BREAST CANCER, HX OF 01/19/2007   Qualifier: Diagnosis of   By: Zulema Georgi         Cancer [facility])    breast cancer   Chicken pox    Chronic throat clearing 08/05/2021   Last Assessment & Plan:    Emergency department follow-up for acute exacerbation of a chronic problem with significant throat awareness.   Has been present for years.  She was seen over 5 years ago for similar symptoms.  On her most recent visit to the emergency department, she was having intense throat awareness.  Flexible laryngoscopy in the emergency department revealed no lesions, masses or    DIVERTICULOSIS OF COLON 07/28/2004   Qualifier: Diagnosis of   By: Lewellyn CMA (AAMA), Alan      Replacing diagnoses that were inactivated after the 09/06/22 regulatory import     ESOPHAGEAL STRICTURE 08/24/2004   Qualifier: Diagnosis of   By: Lewellyn  CMA (AAMA), Amanda         Essential hypertension 06/30/2007   Qualifier: Diagnosis of   By: Lewellyn CMA (AAMA), Amanda         GERD 01/19/2007   Qualifier: Diagnosis of   By: Zulema Georgi         Glaucoma    Hay fever    History of artificial joint 01/01/2014   Hyperlipidemia    Lower back pain 06/22/2011   Osteoarthritis of left knee 12/26/2013   Personal history of urinary (tract) infections    Psoriasis 06/22/2011   Senile nuclear sclerosis 04/08/2017   Vitamin D  deficiency 06/22/2011    Past Surgical History:  Procedure Laterality Date   BREAST BIOPSY Left 01/26/2023   MM LT BREAST BX W LOC DEV 1ST LESION IMAGE BX SPEC STEREO GUIDE 01/26/2023 [facility]   BREAST EXCISIONAL BIOPSY Left  1996   BREAST EXCISIONAL BIOPSY Right 1974   BREAST LUMPECTOMY Right 1999   malignant   BREAST SURGERY  1999  1996   R BREAST (CANCEROUS)  L BREAST (BENIGN)      MOUTH SURGERY     WISDOM TEETH REMOVAL   PARTIAL KNEE ARTHROPLASTY Left 12/26/2013   Procedure: UNICOMPARTMENTAL KNEE;  Surgeon: Marcey Raman, MD;  Location: [facility] OR;  Service: Orthopedics;  Laterality: Left;   TONSILLECTOMY  1954    Social History[1]  Family History  Problem Relation Age of Onset   Hypertension Mother    Heart disease Mother    Stroke Mother        HEMORRAGHIC   Cancer Father 45       PANCREATIC   Hypertension Father    Breast cancer Sister    Hypertension Sister    Thyroid  disease Sister    Stroke Sister    Hypertension Brother    Colon cancer Cousin     Allergies[2]  Medication list has been reviewed and updated.  Medications Ordered Prior to Encounter[3]  Review of Systems:  As per HPI- otherwise negative.   Physical Examination: There were no vitals filed for this visit. There were no vitals filed for this visit. There is no height or weight on file to calculate BMI. Ideal Body Weight:    GEN: no acute distress. HEENT: Atraumatic, Normocephalic.  Ears and Nose: No external deformity. CV: RRR, No M/G/R. No JVD. No thrill. No extra heart sounds. PULM: CTA B, no wheezes, crackles, rhonchi. No retractions. No resp. distress. No accessory muscle use. ABD: S, NT, ND, +BS. No rebound. No HSM. EXTR: No c/c/e PSYCH: Normally interactive. Conversant.    Assessment and Plan: No diagnosis found.  Assessment & Plan   Signed Harlene Schroeder, MD    [1]  Social History Tobacco Use   Smoking status: Former    Current packs/day: 0.00    Types: Cigarettes    Start date: 06/08/1971    Quit date: 06/07/1973    Years since quitting: 50.9   Smokeless tobacco: Never  Substance Use Topics   Alcohol use: No   Drug use: No  [2]  Allergies Allergen Reactions   Nsaids Other (See Comments)     Pt stated they make her stomach burn   Aspirin Other (See Comments)    GI UPSET-STOMACH BURNS   Ibuprofen Other (See Comments)    STOMACH BURNS SLIGHTLY   Effexor  [Venlafaxine ] Nausea Only  [3]  Current Outpatient Medications on File Prior to Visit  Medication Sig Dispense Refill   alendronate  (FOSAMAX ) 70 MG tablet  TAKE 1 TABLET (70 MG TOTAL) BY MOUTH EVERY 7 DAYS WITH FULL GLASS WATER ON EMPTY STOMACH 12 tablet 3   aluminum & magnesium hydroxide-simethicone (MYLANTA) 500-450-40 MG/5ML suspension Take by mouth every 6 (six) hours as needed for indigestion.     diclofenac Sodium (VOLTAREN) 1 % GEL Apply 1 Application topically as needed (pain).     diltiazem  (CARDIZEM ) 30 MG tablet Take 1 tablet (30 mg total) by mouth every 6 (six) hours as needed (for elevated heart rates). 30 tablet 1   dorzolamide-timolol (COSOPT) 2-0.5 % ophthalmic solution Place 1 drop into both eyes every morning.     famotidine-calcium carbonate-magnesium hydroxide (PEPCID COMPLETE) 10-800-165 MG chewable tablet Chew 1 tablet by mouth daily as needed.     fluticasone -salmeterol (WIXELA INHUB) 250-50 MCG/ACT AEPB Inhale 1 puff into the lungs in the morning and at bedtime. 60 each 3   hydrochlorothiazide  (MICROZIDE ) 12.5 MG capsule Take 1 capsule (12.5 mg total) by mouth daily. 90 capsule 1   Latanoprostene Bunod 0.024 % SOLN Apply 1 drop to eye at bedtime.     metoprolol  succinate (TOPROL -XL) 50 MG 24 hr tablet Take 1 tablet (50 mg total) by mouth daily. Take with or immediately following a meal. 90 tablet 2   montelukast  (SINGULAIR ) 10 MG tablet TAKE 1 TABLET BY MOUTH EVERYDAY AT BEDTIME 90 tablet 1   niacin 500 MG tablet Take 500 mg by mouth daily.     Polyethyl Glycol-Propyl Glycol (SYSTANE OP) Place 1 drop into both eyes daily as needed (for dry eyes).     No current facility-administered medications on file prior to visit.

## 2024-05-23 ENCOUNTER — Encounter: Payer: Self-pay | Admitting: Family Medicine

## 2024-05-23 ENCOUNTER — Ambulatory Visit: Admitting: Family Medicine

## 2024-05-23 VITALS — BP 116/74 | HR 61 | Ht <= 58 in | Wt 123.6 lb

## 2024-05-23 DIAGNOSIS — I10 Essential (primary) hypertension: Secondary | ICD-10-CM | POA: Diagnosis not present

## 2024-05-23 DIAGNOSIS — M25511 Pain in right shoulder: Secondary | ICD-10-CM

## 2024-05-23 DIAGNOSIS — M25512 Pain in left shoulder: Secondary | ICD-10-CM | POA: Diagnosis not present

## 2024-05-23 DIAGNOSIS — E785 Hyperlipidemia, unspecified: Secondary | ICD-10-CM | POA: Diagnosis not present

## 2024-05-23 DIAGNOSIS — G8929 Other chronic pain: Secondary | ICD-10-CM | POA: Diagnosis not present

## 2024-05-23 DIAGNOSIS — E876 Hypokalemia: Secondary | ICD-10-CM

## 2024-05-23 DIAGNOSIS — Z1329 Encounter for screening for other suspected endocrine disorder: Secondary | ICD-10-CM

## 2024-05-23 DIAGNOSIS — Z131 Encounter for screening for diabetes mellitus: Secondary | ICD-10-CM

## 2024-05-24 ENCOUNTER — Encounter: Payer: Self-pay | Admitting: Family Medicine

## 2024-05-24 LAB — CBC
HCT: 39 % (ref 36.0–46.0)
Hemoglobin: 13.2 g/dL (ref 12.0–15.0)
MCHC: 33.9 g/dL (ref 30.0–36.0)
MCV: 91.3 fl (ref 78.0–100.0)
Platelets: 333 K/uL (ref 150.0–400.0)
RBC: 4.28 Mil/uL (ref 3.87–5.11)
RDW: 12.7 % (ref 11.5–15.5)
WBC: 5.7 K/uL (ref 4.0–10.5)

## 2024-05-24 LAB — COMPREHENSIVE METABOLIC PANEL WITH GFR
ALT: 14 U/L (ref 3–35)
AST: 21 U/L (ref 5–37)
Albumin: 4.2 g/dL (ref 3.5–5.2)
Alkaline Phosphatase: 82 U/L (ref 39–117)
BUN: 17 mg/dL (ref 6–23)
CO2: 28 meq/L (ref 19–32)
Calcium: 10.3 mg/dL (ref 8.4–10.5)
Chloride: 103 meq/L (ref 96–112)
Creatinine, Ser: 0.92 mg/dL (ref 0.40–1.20)
GFR: 59.13 mL/min — ABNORMAL LOW (ref >60.00)
Glucose, Bld: 93 mg/dL (ref 70–99)
Potassium: 3.9 meq/L (ref 3.5–5.1)
Sodium: 141 meq/L (ref 135–145)
Total Bilirubin: 0.5 mg/dL (ref 0.2–1.2)
Total Protein: 6.5 g/dL (ref 6.0–8.3)

## 2024-05-24 LAB — HEMOGLOBIN A1C: Hgb A1c MFr Bld: 5.7 % (ref 4.6–6.5)

## 2024-05-24 LAB — TSH: TSH: 1.38 u[IU]/mL (ref 0.35–5.50)

## 2024-05-24 LAB — LIPID PANEL
Cholesterol: 224 mg/dL — ABNORMAL HIGH (ref 28–200)
HDL: 100.4 mg/dL (ref >39.00)
LDL Cholesterol: 102 mg/dL — ABNORMAL HIGH (ref 10–99)
NonHDL: 123.68
Total CHOL/HDL Ratio: 2
Triglycerides: 107 mg/dL (ref 10.0–149.0)
VLDL: 21.4 mg/dL (ref 0.0–40.0)

## 2024-06-07 ENCOUNTER — Other Ambulatory Visit: Payer: Self-pay | Admitting: Family Medicine

## 2024-06-07 DIAGNOSIS — I10 Essential (primary) hypertension: Secondary | ICD-10-CM

## 2024-06-07 DIAGNOSIS — I872 Venous insufficiency (chronic) (peripheral): Secondary | ICD-10-CM

## 2024-06-08 ENCOUNTER — Other Ambulatory Visit: Payer: Self-pay | Admitting: Family Medicine

## 2024-06-10 ENCOUNTER — Other Ambulatory Visit: Payer: Self-pay | Admitting: Family Medicine

## 2024-06-18 ENCOUNTER — Telehealth: Payer: Self-pay

## 2024-06-18 DIAGNOSIS — R944 Abnormal results of kidney function studies: Secondary | ICD-10-CM

## 2024-06-18 NOTE — Telephone Encounter (Signed)
 Scheduled pt a lab appointment 07/16/2024.

## 2024-06-18 NOTE — Telephone Encounter (Signed)
 Copied from CRM #[id number]. Topic: Clinical - Request for Lab/Test Order >> Jun 18, 2024  9:57 AM Berneda FALCON wrote: Reason for CRM: Patient states PCP wanted her to have repeat labs for kidney function but there are no orders in the chart yet. Can we go ahead and place the orders for this so we can schedule her?  Patient callback is [phone] (home)

## 2024-06-18 NOTE — Telephone Encounter (Signed)
 Called pt to schedule a lab appointment, was not able to leave a VM.

## 2024-06-18 NOTE — Addendum Note (Signed)
 Addended by: WATT RAISIN C on: 06/18/2024 10:13 AM   Modules accepted: Orders

## 2024-06-25 ENCOUNTER — Ambulatory Visit: Admitting: Cardiology

## 2024-07-03 ENCOUNTER — Telehealth: Payer: Self-pay | Admitting: Cardiology

## 2024-07-03 NOTE — Telephone Encounter (Signed)
 STAT EP triage call.  Spoke with patient, between 1-2pm, tachycardia event started - HR 190s-205. Took diltiazem  at 2pm. Her current HR is 121. BP not assessed.   Similar episode on 1/18 - resolved on its own with PRN dilt.   She has had similar episodes in past, per Dr. Carolyn note. Has required adenosine in the past.   Advised she call 911 for EMS assessment given sustained tachycardia  Routed to MD as FYI

## 2024-07-03 NOTE — Telephone Encounter (Signed)
 STAT if HR is under 50 or over 120 (normal HR is 60-100 beats per minute)  What is your heart rate? 190  Do you have a log of your heart rate readings (document readings)? 190, 200  Do you have any other symptoms? Shortness of breath

## 2024-07-06 NOTE — Progress Notes (Unsigned)
 " Electrophysiology Office Note:   Date:  07/10/2024  ID:  Alyssa Nielsen, DOB [birth year], MRN [id number]  Primary Cardiologist: None Primary Heart Failure: None Electrophysiologist: Will Gladis Norton, MD      History of Present Illness:   Alyssa Nielsen is a 79 y.o. female with h/o SVT, HTN, HLD seen today for post-hospital followup.   Seen in ER on 07/03/24 at [facility] with palpitations at rest.  Admitted through 07/08/24. She noted HR's of 190-205 bpm.  She took PRN diltiazem  without relief.  EMS attempted vagal maneuvers without success then attempted adenosine 6mg  and she converted to NSR.  She was instructed to stop hydrochlorothiazide  and to begin cardizem  120 mg daily.  She notes she had to have potassium during admit. Troponin negative.   She reports she has not had fast rhythms since discharge. She has not picked up her Cardizem  yet. She plans to get it this afternoon. She remains on her Toprol  and takes it in the evening.  She states the episodes start out of the blue and she will vibrate because her heart is going so fast. 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 She notes she has recently needed to take her PRN cardizem  > 2-3 times since it was prescribed.  Once she took one and waited 6 hours and took another.    She denies chest pain, palpitations, dyspnea, PND, orthopnea, nausea, vomiting, dizziness, syncope, edema, weight gain, or early satiety.   Review of systems complete and found to be negative unless listed in HPI.   EP Information / Studies Reviewed:    EKG is ordered today. Personal review as below.  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     Physical Exam:   VS:  BP (!) 142/90   Pulse 77   Ht 4' 10 (1.473 m)   Wt 122 lb 12.8 oz (55.7 kg)   SpO2 97%   BMI 25.67 kg/m    Wt Readings from Last 3 Encounters:  07/10/24 122 lb 12.8 oz (55.7 kg)  05/23/24 123 lb 9.6 oz (56.1 kg)  04/17/24 124 lb 3.2 oz (56.3 kg)     GEN: pleasant, well nourished, well developed in no acute distress NECK: No JVD; No carotid bruits CARDIAC: Regular rate and rhythm, no murmurs, rubs, gallops RESPIRATORY:  Clear to auscultation without rales, wheezing or rhonchi  ABDOMEN: Soft, non-tender, non-distended EXTREMITIES:  No edema; No deformity   Risk Assessment/Calculations:       ASSESSMENT AND PLAN:    SVT  -Toprol  50 mg daily  -add Cardizem  CD 120 mg daily > discussed slow position changes until she is used to the medication  -stop hydrochlorothiazide  > pt instructed to call if swelling issues  -continue cardizem  30mg  Q6 PRN palpitations -EKG with NSR  -pending response to long acting AV nodal blocker, consider repeat monitor & possible EPS  -pt confirms no mention of AF / AFL (in regards to [facility])  Hypertension  -mildly elevated in clinic > off hydrochlorothiazide    Hypokalemia  -  noted during admit -recheck BMP    Follow up with EP APP in 4 weeks   Signed, Daphne Barrack, NP-C, AGACNP-BC [facility] - Electrophysiology  07/10/2024, 3:19 PM  "

## 2024-07-10 ENCOUNTER — Ambulatory Visit: Admitting: Pulmonary Disease

## 2024-07-10 ENCOUNTER — Encounter: Payer: Self-pay | Admitting: Pulmonary Disease

## 2024-07-10 VITALS — BP 142/90 | HR 77 | Ht <= 58 in | Wt 122.8 lb

## 2024-07-10 DIAGNOSIS — I471 Supraventricular tachycardia, unspecified: Secondary | ICD-10-CM

## 2024-07-10 DIAGNOSIS — I10 Essential (primary) hypertension: Secondary | ICD-10-CM

## 2024-07-10 LAB — BASIC METABOLIC PANEL WITH GFR
BUN/Creatinine Ratio: 22 (ref 12–28)
BUN: 21 mg/dL (ref 8–27)
CO2: 20 mmol/L (ref 20–29)
Calcium: 10.5 mg/dL — ABNORMAL HIGH (ref 8.7–10.3)
Chloride: 107 mmol/L — ABNORMAL HIGH (ref 96–106)
Creatinine, Ser: 0.95 mg/dL (ref 0.57–1.00)
Glucose: 83 mg/dL (ref 70–99)
Potassium: 3.9 mmol/L (ref 3.5–5.2)
Sodium: 147 mmol/L — ABNORMAL HIGH (ref 134–144)
eGFR: 61 mL/min/{1.73_m2}

## 2024-07-10 NOTE — Patient Instructions (Addendum)
 Medication Instructions:  Hold hydrochlorothiazide   Pick up Diltiazem  120 mg that was recently prescribed  *If you need a refill on your cardiac medications before your next appointment, please call your pharmacy*  Lab Work: BMET-Today  If you have labs (blood work) drawn today and your tests are completely normal, you will receive your results only by: MyChart Message (if you have MyChart) OR A paper copy in the mail If you have any lab test that is abnormal or we need to change your treatment, we will call you to review the results.  Follow-Up: At [facility], you and your health needs are our priority.  As part of our continuing mission to provide you with exceptional heart care, our providers are all part of one team.  This team includes your primary Cardiologist (physician) and Advanced Practice Providers or APPs (Physician Assistants and Nurse Practitioners) who all work together to provide you with the care you need, when you need it.  Your next appointment:   1 month(s)  Provider:   Daphne Barrack, NP    Other instructions Call and let us  know if you continue to have swelling  We recommend signing up for the patient portal called MyChart.  Sign up information is provided on this After Visit Summary.  MyChart is used to connect with patients for Virtual Visits (Telemedicine).  Patients are able to view lab/test results, encounter notes, upcoming appointments, etc.  Non-urgent messages can be sent to your provider as well.   To learn more about what you can do with MyChart, go to [email or web address].

## 2024-07-11 ENCOUNTER — Ambulatory Visit: Payer: Self-pay | Admitting: Pulmonary Disease

## 2024-07-12 ENCOUNTER — Other Ambulatory Visit: Payer: Self-pay | Admitting: Family Medicine

## 2024-07-12 DIAGNOSIS — J4520 Mild intermittent asthma, uncomplicated: Secondary | ICD-10-CM

## 2024-07-16 ENCOUNTER — Other Ambulatory Visit

## 2024-07-16 ENCOUNTER — Inpatient Hospital Stay: Admitting: Family Medicine

## 2024-08-14 ENCOUNTER — Other Ambulatory Visit: Admitting: Obstetrics and Gynecology

## 2024-08-14 ENCOUNTER — Other Ambulatory Visit

## 2024-08-20 ENCOUNTER — Ambulatory Visit: Admitting: Cardiology

## 2024-08-31 ENCOUNTER — Ambulatory Visit: Admitting: Pulmonary Disease

## 2025-04-24 ENCOUNTER — Ambulatory Visit
